# Patient Record
Sex: Female | Born: 1950
Health system: Southern US, Community
[De-identification: ages and names within clinical notes are randomized; demographics above are authoritative.]

## PROBLEM LIST (undated history)

## (undated) DIAGNOSIS — H3561 Retinal hemorrhage, right eye: Secondary | ICD-10-CM

## (undated) DIAGNOSIS — M8580 Other specified disorders of bone density and structure, unspecified site: Secondary | ICD-10-CM

## (undated) DIAGNOSIS — I10 Essential (primary) hypertension: Secondary | ICD-10-CM

## (undated) DIAGNOSIS — Z8619 Personal history of other infectious and parasitic diseases: Secondary | ICD-10-CM

## (undated) DIAGNOSIS — B019 Varicella without complication: Secondary | ICD-10-CM

## (undated) DIAGNOSIS — Z720 Tobacco use: Secondary | ICD-10-CM

## (undated) DIAGNOSIS — B059 Measles without complication: Secondary | ICD-10-CM

## (undated) DIAGNOSIS — E785 Hyperlipidemia, unspecified: Secondary | ICD-10-CM

## (undated) HISTORY — DX: Essential (primary) hypertension: I10

## (undated) HISTORY — DX: Tobacco use: Z72.0

## (undated) HISTORY — DX: Measles without complication: B05.9

## (undated) HISTORY — DX: Retinal hemorrhage, right eye: H35.61

## (undated) HISTORY — DX: Hyperlipidemia, unspecified: E78.5

## (undated) HISTORY — PX: REFRACTIVE SURGERY: SHX103

## (undated) HISTORY — DX: Varicella without complication: B01.9

## (undated) HISTORY — DX: Other specified disorders of bone density and structure, unspecified site: M85.80

## (undated) HISTORY — DX: Personal history of other infectious and parasitic diseases: Z86.19

---

## 2010-10-20 LAB — HM MAMMOGRAPHY: HM Mammogram: NORMAL

## 2010-10-20 LAB — HM PAP SMEAR: HM Pap smear: NORMAL

## 2014-03-16 ENCOUNTER — Encounter: Payer: Self-pay | Admitting: Family Medicine

## 2014-03-16 ENCOUNTER — Ambulatory Visit (INDEPENDENT_AMBULATORY_CARE_PROVIDER_SITE_OTHER): Payer: Managed Care, Other (non HMO) | Admitting: Family Medicine

## 2014-03-16 VITALS — BP 166/100 | HR 71 | Temp 98.2°F | Ht 64.25 in | Wt 146.1 lb

## 2014-03-16 DIAGNOSIS — IMO0001 Reserved for inherently not codable concepts without codable children: Secondary | ICD-10-CM

## 2014-03-16 DIAGNOSIS — R030 Elevated blood-pressure reading, without diagnosis of hypertension: Secondary | ICD-10-CM

## 2014-03-16 DIAGNOSIS — H3560 Retinal hemorrhage, unspecified eye: Secondary | ICD-10-CM

## 2014-03-16 DIAGNOSIS — I10 Essential (primary) hypertension: Secondary | ICD-10-CM

## 2014-03-16 DIAGNOSIS — B019 Varicella without complication: Secondary | ICD-10-CM | POA: Insufficient documentation

## 2014-03-16 DIAGNOSIS — F17200 Nicotine dependence, unspecified, uncomplicated: Secondary | ICD-10-CM

## 2014-03-16 DIAGNOSIS — E785 Hyperlipidemia, unspecified: Secondary | ICD-10-CM

## 2014-03-16 DIAGNOSIS — E782 Mixed hyperlipidemia: Secondary | ICD-10-CM | POA: Insufficient documentation

## 2014-03-16 DIAGNOSIS — Z8619 Personal history of other infectious and parasitic diseases: Secondary | ICD-10-CM | POA: Insufficient documentation

## 2014-03-16 DIAGNOSIS — Z0000 Encounter for general adult medical examination without abnormal findings: Secondary | ICD-10-CM

## 2014-03-16 DIAGNOSIS — Z720 Tobacco use: Secondary | ICD-10-CM

## 2014-03-16 DIAGNOSIS — H3561 Retinal hemorrhage, right eye: Secondary | ICD-10-CM

## 2014-03-16 HISTORY — DX: Retinal hemorrhage, right eye: H35.61

## 2014-03-16 HISTORY — DX: Hyperlipidemia, unspecified: E78.5

## 2014-03-16 HISTORY — DX: Tobacco use: Z72.0

## 2014-03-16 HISTORY — DX: Essential (primary) hypertension: I10

## 2014-03-16 LAB — CBC
HCT: 40.4 % (ref 36.0–46.0)
Hemoglobin: 13.6 g/dL (ref 12.0–15.0)
MCH: 26.2 pg (ref 26.0–34.0)
MCHC: 33.7 g/dL (ref 30.0–36.0)
MCV: 77.7 fL — AB (ref 78.0–100.0)
Platelets: 195 10*3/uL (ref 150–400)
RBC: 5.2 MIL/uL — AB (ref 3.87–5.11)
RDW: 14.4 % (ref 11.5–15.5)
WBC: 8.1 10*3/uL (ref 4.0–10.5)

## 2014-03-16 LAB — RENAL FUNCTION PANEL
Albumin: 4.5 g/dL (ref 3.5–5.2)
BUN: 11 mg/dL (ref 6–23)
CO2: 30 meq/L (ref 19–32)
CREATININE: 0.69 mg/dL (ref 0.50–1.10)
Calcium: 9.4 mg/dL (ref 8.4–10.5)
Chloride: 103 mEq/L (ref 96–112)
Glucose, Bld: 82 mg/dL (ref 70–99)
Phosphorus: 3.9 mg/dL (ref 2.3–4.6)
Potassium: 4.8 mEq/L (ref 3.5–5.3)
Sodium: 138 mEq/L (ref 135–145)

## 2014-03-16 LAB — LIPID PANEL
CHOL/HDL RATIO: 7.1 ratio
Cholesterol: 263 mg/dL — ABNORMAL HIGH (ref 0–200)
HDL: 37 mg/dL — ABNORMAL LOW (ref >39)
LDL Cholesterol: 187 mg/dL — ABNORMAL HIGH (ref 0–99)
Triglycerides: 197 mg/dL — ABNORMAL HIGH (ref <150)
VLDL: 39 mg/dL (ref 0–40)

## 2014-03-16 LAB — HEPATIC FUNCTION PANEL
ALBUMIN: 4.5 g/dL (ref 3.5–5.2)
ALK PHOS: 100 U/L (ref 39–117)
ALT: 10 U/L (ref 0–35)
AST: 16 U/L (ref 0–37)
Bilirubin, Direct: 0.1 mg/dL (ref 0.0–0.3)
Indirect Bilirubin: 0.4 mg/dL (ref 0.2–1.2)
Total Bilirubin: 0.5 mg/dL (ref 0.2–1.2)
Total Protein: 7.5 g/dL (ref 6.0–8.3)

## 2014-03-16 NOTE — Assessment & Plan Note (Signed)
Many years ago it was cauterized per patient and has not recurred, does have persistent defect in medial visual field on right eye

## 2014-03-16 NOTE — Assessment & Plan Note (Addendum)
Patient denies diagnosis of HTN in past but did say it was up at last visit a couple years ago. Poorly controlled  encouraged DASH diet, minimize caffeine and obtain adequate sleep. Report concerning symptoms and follow up as directed and as needed.

## 2014-03-16 NOTE — Assessment & Plan Note (Signed)
Encouraged complete cessation. Discussed need to quit as relates to risk of numerous cancers, cardiac and pulmonary disease as well as neurologic complications. Counseled for greater than 3 minutes 

## 2014-03-16 NOTE — Progress Notes (Signed)
Patient ID: Cynthia LintsCheryl Cassarino, female   DOB: 1951/09/07, 63 y.o.   MRN: [id number] Cynthia LintsCheryl Hubbard [id number] 1951/09/07 03/16/2014      Progress Note New Patient  Subjective  Chief Complaint  Chief Complaint  Patient presents with  . Establish Care    new patient    HPI  Patient is a 63 year old female in today for routine medical care. He is to establish care. Her OB/GYN return a couple of years ago and she needs a new primary doctor as well. She drinks a significant amount of caffeine each day with roughly 40 ounces of Pepsi and coffee everyday. Denies any recent illness or acute complaints. Denies CP/palp/SOB/HA/congestion/fevers/GI or GU c/o. Taking meds as prescribed  Past Medical History  Diagnosis Date  . Chicken pox 63 yrs old  . Measles 6 th grade    3 day measles  . Hyperlipidemia     weighed 30 pounds heavier- used to take crestor  . Hypertension     was 30 pounds heavier    History reviewed. No pertinent past surgical history.  Family History  Problem Relation Age of Onset  . Adopted: Yes    History   Social History  . Marital Status: Married    Spouse Name: N/A    Number of Children: N/A  . Years of Education: N/A   Occupational History  . Not on file.   Social History Main Topics  . Smoking status: Current Every Day Smoker -- 0.75 packs/day for 40 years    Types: Cigarettes  . Smokeless tobacco: Never Used  . Alcohol Use: Yes     Comment: very seldom  . Drug Use: No  . Sexual Activity: Not on file   Other Topics Concern  . Not on file   Social History Narrative  . No narrative on file    No current outpatient prescriptions on file prior to visit.   No current facility-administered medications on file prior to visit.    No Known Allergies  Review of Systems  Review of Systems  Constitutional: Negative for fever, chills and malaise/fatigue.  HENT: Negative for congestion, hearing loss and nosebleeds.   Eyes: Negative for discharge.   Respiratory: Negative for cough, sputum production, shortness of breath and wheezing.   Cardiovascular: Negative for chest pain, palpitations and leg swelling.  Gastrointestinal: Negative for heartburn, nausea, vomiting, abdominal pain, diarrhea, constipation and blood in stool.  Genitourinary: Negative for dysuria, urgency, frequency and hematuria.  Musculoskeletal: Negative for back pain, falls and myalgias.  Skin: Negative for rash.  Neurological: Negative for dizziness, tremors, sensory change, focal weakness, loss of consciousness, weakness and headaches.  Endo/Heme/Allergies: Negative for polydipsia. Does not bruise/bleed easily.  Psychiatric/Behavioral: Negative for depression and suicidal ideas. The patient is not nervous/anxious and does not have insomnia.     Objective  BP 166/100  Pulse 71  Temp(Src) 98.2 F (36.8 C) (Oral)  Ht 5' 4.25" (1.632 m)  Wt 146 lb 1.3 oz (66.261 kg)  BMI 24.88 kg/m2  SpO2 99%  Physical Exam  Physical Exam  Constitutional: She is oriented to person, place, and time and well-developed, well-nourished, and in no distress. No distress.  HENT:  Head: Normocephalic and atraumatic.  Right Ear: External ear normal.  Left Ear: External ear normal.  Nose: Nose normal.  Mouth/Throat: Oropharynx is clear and moist. No oropharyngeal exudate.  Eyes: Conjunctivae are normal. Pupils are equal, round, and reactive to light. Right eye exhibits no discharge. Left eye exhibits no  discharge. No scleral icterus.  Neck: Normal range of motion. Neck supple. No thyromegaly present.  Cardiovascular: Normal rate, regular rhythm, normal heart sounds and intact distal pulses.   No murmur heard. Pulmonary/Chest: Effort normal and breath sounds normal. No respiratory distress. She has no wheezes. She has no rales.  Abdominal: Soft. Bowel sounds are normal. She exhibits no distension and no mass. There is no tenderness.  Musculoskeletal: Normal range of motion. She  exhibits no edema and no tenderness.  Lymphadenopathy:    She has no cervical adenopathy.  Neurological: She is alert and oriented to person, place, and time. She has normal reflexes. No cranial nerve deficit. Coordination normal.  Skin: Skin is warm and dry. No rash noted. She is not diaphoretic.  Psychiatric: Mood, memory and affect normal.       Assessment & Plan   Elevated BP Patient denies diagnosis of HTN in past but did say it was up at last visit a couple years ago. Poorly controlled  encouraged DASH diet, minimize caffeine and obtain adequate sleep. Report concerning symptoms and follow up as directed and as needed.   Other and unspecified hyperlipidemia On Crestor in past abut has not taken in several years since her doctor retired. Will recheck lipids. Most likely restart Crestor  Retinal hemorrhage of right eye Many years ago it was cauterized per patient and has not recurred, does have persistent defect in medial visual field on right eye  Tobacco abuse disorder Encouraged complete cessation. Discussed need to quit as relates to risk of numerous cancers, cardiac and pulmonary disease as well as neurologic complications. Counseled for greater than 3 minutes  Preventative health care Declines referral for colonoscopy which she has never had despite significant discussion. Agrees to proceed with MGM. Will return for annual exam with gyn. Will have annual labs done

## 2014-03-16 NOTE — Patient Instructions (Addendum)
Call [facility] for your mammogram Probiotic such as Digestive Advantage, Phillip's colon health    Preventive Care for Adults, Female A healthy lifestyle and preventive care can promote health and wellness. Preventive health guidelines for women include the following key practices.  A routine yearly physical is a good way to check with your health care provider about your health and preventive screening. It is a chance to share any concerns and updates on your health and to receive a thorough exam.  Visit your dentist for a routine exam and preventive care every 6 months. Brush your teeth twice a day and floss once a day. Good oral hygiene prevents tooth decay and gum disease.  The frequency of eye exams is based on your age, health, family medical history, use of contact lenses, and other factors. Follow your health care provider's recommendations for frequency of eye exams.  Eat a healthy diet. Foods like vegetables, fruits, whole grains, low-fat dairy products, and lean protein foods contain the nutrients you need without too many calories. Decrease your intake of foods high in solid fats, added sugars, and salt. Eat the right amount of calories for you.Get information about a proper diet from your health care provider, if necessary.  Regular physical exercise is one of the most important things you can do for your health. Most adults should get at least 150 minutes of moderate-intensity exercise (any activity that increases your heart rate and causes you to sweat) each week. In addition, most adults need muscle-strengthening exercises on 2 or more days a week.  Maintain a healthy weight. The body mass index (BMI) is a screening tool to identify possible weight problems. It provides an estimate of body fat based on height and weight. Your health care provider can find your BMI, and can help you achieve or maintain a healthy weight.For adults 20 years and older:  A BMI below 18.5 is considered  underweight.  A BMI of 18.5 to 24.9 is normal.  A BMI of 25 to 29.9 is considered overweight.  A BMI of 30 and above is considered obese.  Maintain normal blood lipids and cholesterol levels by exercising and minimizing your intake of saturated fat. Eat a balanced diet with plenty of fruit and vegetables. Blood tests for lipids and cholesterol should begin at age 57 and be repeated every 5 years. If your lipid or cholesterol levels are high, you are over 50, or you are at high risk for heart disease, you may need your cholesterol levels checked more frequently.Ongoing high lipid and cholesterol levels should be treated with medicines if diet and exercise are not working.  If you smoke, find out from your health care provider how to quit. If you do not use tobacco, do not start.  Lung cancer screening is recommended for adults aged 13 80 years who are at high risk for developing lung cancer because of a history of smoking. A yearly low-dose CT scan of the lungs is recommended for people who have at least a 30-pack-year history of smoking and are a current smoker or have quit within the past 15 years. A pack year of smoking is smoking an average of 1 pack of cigarettes a day for 1 year (for example: 1 pack a day for 30 years or 2 packs a day for 15 years). Yearly screening should continue until the smoker has stopped smoking for at least 15 years. Yearly screening should be stopped for people who develop a health problem that would prevent them  from having lung cancer treatment.  If you are pregnant, do not drink alcohol. If you are breastfeeding, be very cautious about drinking alcohol. If you are not pregnant and choose to drink alcohol, do not have more than 1 drink per day. One drink is considered to be 12 ounces (355 mL) of beer, 5 ounces (148 mL) of wine, or 1.5 ounces (44 mL) of liquor.  Avoid use of street drugs. Do not share needles with anyone. Ask for help if you need support or  instructions about stopping the use of drugs.  High blood pressure causes heart disease and increases the risk of stroke. Your blood pressure should be checked at least every 1 to 2 years. Ongoing high blood pressure should be treated with medicines if weight loss and exercise do not work.  If you are 40 65 years old, ask your health care provider if you should take aspirin to prevent strokes.  Diabetes screening involves taking a blood sample to check your fasting blood sugar level. This should be done once every 3 years, after age 83, if you are within normal weight and without risk factors for diabetes. Testing should be considered at a younger age or be carried out more frequently if you are overweight and have at least 1 risk factor for diabetes.  Breast cancer screening is essential preventive care for women. You should practice "breast self-awareness." This means understanding the normal appearance and feel of your breasts and may include breast self-examination. Any changes detected, no matter how small, should be reported to a health care provider. Women in their 75s and 30s should have a clinical breast exam (CBE) by a health care provider as part of a regular health exam every 1 to 3 years. After age 6, women should have a CBE every year. Starting at age 68, women should consider having a mammogram (breast X-ray test) every year. Women who have a family history of breast cancer should talk to their health care provider about genetic screening. Women at a high risk of breast cancer should talk to their health care providers about having an MRI and a mammogram every year.  Breast cancer gene (BRCA)-related cancer risk assessment is recommended for women who have family members with BRCA-related cancers. BRCA-related cancers include breast, ovarian, tubal, and peritoneal cancers. Having family members with these cancers may be associated with an increased risk for harmful changes (mutations) in  the breast cancer genes BRCA1 and BRCA2. Results of the assessment will determine the need for genetic counseling and BRCA1 and BRCA2 testing.  The Pap test is a screening test for cervical cancer. A Pap test can show cell changes on the cervix that might become cervical cancer if left untreated. A Pap test is a procedure in which cells are obtained and examined from the lower end of the uterus (cervix).  Women should have a Pap test starting at age 73.  Between ages 68 and 69, Pap tests should be repeated every 2 years.  Beginning at age 45, you should have a Pap test every 3 years as long as the past 3 Pap tests have been normal.  Some women have medical problems that increase the chance of getting cervical cancer. Talk to your health care provider about these problems. It is especially important to talk to your health care provider if a new problem develops soon after your last Pap test. In these cases, your health care provider may recommend more frequent screening and Pap tests.  The above recommendations are the same for women who have or have not gotten the vaccine for human papillomavirus (HPV).  If you had a hysterectomy for a problem that was not cancer or a condition that could lead to cancer, then you no longer need Pap tests. Even if you no longer need a Pap test, a regular exam is a good idea to make sure no other problems are starting.  If you are between ages 79 and 26 years, and you have had normal Pap tests going back 10 years, you no longer need Pap tests. Even if you no longer need a Pap test, a regular exam is a good idea to make sure no other problems are starting.  If you have had past treatment for cervical cancer or a condition that could lead to cancer, you need Pap tests and screening for cancer for at least 20 years after your treatment.  If Pap tests have been discontinued, risk factors (such as a new sexual partner) need to be reassessed to determine if screening  should be resumed.  The HPV test is an additional test that may be used for cervical cancer screening. The HPV test looks for the virus that can cause the cell changes on the cervix. The cells collected during the Pap test can be tested for HPV. The HPV test could be used to screen women aged 73 years and older, and should be used in women of any age who have unclear Pap test results. After the age of 31, women should have HPV testing at the same frequency as a Pap test.  Colorectal cancer can be detected and often prevented. Most routine colorectal cancer screening begins at the age of 68 years and continues through age 19 years. However, your health care provider may recommend screening at an earlier age if you have risk factors for colon cancer. On a yearly basis, your health care provider may provide home test kits to check for hidden blood in the stool. Use of a small camera at the end of a tube, to directly examine the colon (sigmoidoscopy or colonoscopy), can detect the earliest forms of colorectal cancer. Talk to your health care provider about this at age 14, when routine screening begins. Direct exam of the colon should be repeated every 5 10 years through age 74 years, unless early forms of pre-cancerous polyps or small growths are found.  People who are at an increased risk for hepatitis B should be screened for this virus. You are considered at high risk for hepatitis B if:  You were born in a country where hepatitis B occurs often. Talk with your health care provider about which countries are considered high risk.  Your parents were born in a high-risk country and you have not received a shot to protect against hepatitis B (hepatitis B vaccine).  You have HIV or AIDS.  You use needles to inject street drugs.  You live with, or have sex with, someone who has Hepatitis B.  You get hemodialysis treatment.  You take certain medicines for conditions like cancer, organ transplantation,  and autoimmune conditions.  Hepatitis C blood testing is recommended for all people born from [birth year] through 1967 and any individual with known risks for hepatitis C.  Practice safe sex. Use condoms and avoid high-risk sexual practices to reduce the spread of sexually transmitted infections (STIs). STIs include gonorrhea, chlamydia, syphilis, trichomonas, herpes, HPV, and human immunodeficiency virus (HIV). Herpes, HIV, and HPV are viral illnesses that  have no cure. They can result in disability, cancer, and death. Sexually active women aged 8 years and younger should be checked for chlamydia. Older women with new or multiple partners should also be tested for chlamydia. Testing for other STIs is recommended if you are sexually active and at increased risk.  Osteoporosis is a disease in which the bones lose minerals and strength with aging. This can result in serious bone fractures or breaks. The risk of osteoporosis can be identified using a bone density scan. Women ages 77 years and over and women at risk for fractures or osteoporosis should discuss screening with their health care providers. Ask your health care provider whether you should take a calcium supplement or vitamin D to reduce the rate of osteoporosis.  Menopause can be associated with physical symptoms and risks. Hormone replacement therapy is available to decrease symptoms and risks. You should talk to your health care provider about whether hormone replacement therapy is right for you.  Use sunscreen. Apply sunscreen liberally and repeatedly throughout the day. You should seek shade when your shadow is shorter than you. Protect yourself by wearing long sleeves, pants, a wide-brimmed hat, and sunglasses year round, whenever you are outdoors.  Once a month, do a whole body skin exam, using a mirror to look at the skin on your back. Tell your health care provider of new moles, moles that have irregular borders, moles that are larger than a  pencil eraser, or moles that have changed in shape or color.  Stay current with required vaccines (immunizations).  Influenza vaccine. All adults should be immunized every year.  Tetanus, diphtheria, and acellular pertussis (Td, Tdap) vaccine. Pregnant women should receive 1 dose of Tdap vaccine during each pregnancy. The dose should be obtained regardless of the length of time since the last dose. Immunization is preferred during the 27th 36th week of gestation. An adult who has not previously received Tdap or who does not know her vaccine status should receive 1 dose of Tdap. This initial dose should be followed by tetanus and diphtheria toxoids (Td) booster doses every 10 years. Adults with an unknown or incomplete history of completing a 3-dose immunization series with Td-containing vaccines should begin or complete a primary immunization series including a Tdap dose. Adults should receive a Td booster every 10 years.  Varicella vaccine. An adult without evidence of immunity to varicella should receive 2 doses or a second dose if she has previously received 1 dose. Pregnant females who do not have evidence of immunity should receive the first dose after pregnancy. This first dose should be obtained before leaving the health care facility. The second dose should be obtained 4 8 weeks after the first dose.  Human papillomavirus (HPV) vaccine. Females aged 34 26 years who have not received the vaccine previously should obtain the 3-dose series. The vaccine is not recommended for use in pregnant females. However, pregnancy testing is not needed before receiving a dose. If a female is found to be pregnant after receiving a dose, no treatment is needed. In that case, the remaining doses should be delayed until after the pregnancy. Immunization is recommended for any person with an immunocompromised condition through the age of 46 years if she did not get any or all doses earlier. During the 3-dose series,  the second dose should be obtained 4 8 weeks after the first dose. The third dose should be obtained 24 weeks after the first dose and 16 weeks after the second dose.  Zoster vaccine. One dose is recommended for adults aged 44 years or older unless certain conditions are present.  Measles, mumps, and rubella (MMR) vaccine. Adults born before [birth year] generally are considered immune to measles and mumps. Adults born in [birth year] or later should have 1 or more doses of MMR vaccine unless there is a contraindication to the vaccine or there is laboratory evidence of immunity to each of the three diseases. A routine second dose of MMR vaccine should be obtained at least 28 days after the first dose for students attending postsecondary schools, health care workers, or international travelers. People who received inactivated measles vaccine or an unknown type of measles vaccine during 1963 1967 should receive 2 doses of MMR vaccine. People who received inactivated mumps vaccine or an unknown type of mumps vaccine before 1979 and are at high risk for mumps infection should consider immunization with 2 doses of MMR vaccine. For females of childbearing age, rubella immunity should be determined. If there is no evidence of immunity, females who are not pregnant should be vaccinated. If there is no evidence of immunity, females who are pregnant should delay immunization until after pregnancy. Unvaccinated health care workers born before [birth year] who lack laboratory evidence of measles, mumps, or rubella immunity or laboratory confirmation of disease should consider measles and mumps immunization with 2 doses of MMR vaccine or rubella immunization with 1 dose of MMR vaccine.  Pneumococcal 13-valent conjugate (PCV13) vaccine. When indicated, a person who is uncertain of her immunization history and has no record of immunization should receive the PCV13 vaccine. An adult aged 2 years or older who has certain medical conditions and has  not been previously immunized should receive 1 dose of PCV13 vaccine. This PCV13 should be followed with a dose of pneumococcal polysaccharide (PPSV23) vaccine. The PPSV23 vaccine dose should be obtained at least 8 weeks after the dose of PCV13 vaccine. An adult aged 6 years or older who has certain medical conditions and previously received 1 or more doses of PPSV23 vaccine should receive 1 dose of PCV13. The PCV13 vaccine dose should be obtained 1 or more years after the last PPSV23 vaccine dose.  Pneumococcal polysaccharide (PPSV23) vaccine. When PCV13 is also indicated, PCV13 should be obtained first. All adults aged 81 years and older should be immunized. An adult younger than age 24 years who has certain medical conditions should be immunized. Any person who resides in a nursing home or long-term care facility should be immunized. An adult smoker should be immunized. People with an immunocompromised condition and certain other conditions should receive both PCV13 and PPSV23 vaccines. People with human immunodeficiency virus (HIV) infection should be immunized as soon as possible after diagnosis. Immunization during chemotherapy or radiation therapy should be avoided. Routine use of PPSV23 vaccine is not recommended for American Indians, [place] Natives, or people younger than 65 years unless there are medical conditions that require PPSV23 vaccine. When indicated, people who have unknown immunization and have no record of immunization should receive PPSV23 vaccine. One-time revaccination 5 years after the first dose of PPSV23 is recommended for people aged 3 64 years who have chronic kidney failure, nephrotic syndrome, asplenia, or immunocompromised conditions. People who received 1 2 doses of PPSV23 before age 64 years should receive another dose of PPSV23 vaccine at age 64 years or later if at least 5 years have passed since the previous dose. Doses of PPSV23 are not needed for people immunized with  PPSV23 at or after age 45  years.  Meningococcal vaccine. Adults with asplenia or persistent complement component deficiencies should receive 2 doses of quadrivalent meningococcal conjugate (MenACWY-D) vaccine. The doses should be obtained at least 2 months apart. Microbiologists working with certain meningococcal bacteria, [employer] recruits, people at risk during an outbreak, and people who travel to or live in countries with a high rate of meningitis should be immunized. A first-year college student up through age 68 years who is living in a residence hall should receive a dose if she did not receive a dose on or after her 16th birthday. Adults who have certain high-risk conditions should receive one or more doses of vaccine.  Hepatitis A vaccine. Adults who wish to be protected from this disease, have certain high-risk conditions, work with hepatitis A-infected animals, work in hepatitis A research labs, or travel to or work in countries with a high rate of hepatitis A should be immunized. Adults who were previously unvaccinated and who anticipate close contact with an international adoptee during the first 60 days after arrival in the [place] from a country with a high rate of hepatitis A should be immunized.  Hepatitis B vaccine. Adults who wish to be protected from this disease, have certain high-risk conditions, may be exposed to blood or other infectious body fluids, are household contacts or sex partners of hepatitis B positive people, are clients or workers in certain care facilities, or travel to or work in countries with a high rate of hepatitis B should be immunized.  Haemophilus influenzae type b (Hib) vaccine. A previously unvaccinated person with asplenia or sickle cell disease or having a scheduled splenectomy should receive 1 dose of Hib vaccine. Regardless of previous immunization, a recipient of a hematopoietic stem cell transplant should receive a 3-dose series 6 12 months after her  successful transplant. Hib vaccine is not recommended for adults with HIV infection. Preventive Services / Frequency Ages 96 to 39years  Blood pressure check.** / Every 1 to 2 years.  Lipid and cholesterol check.** / Every 5 years beginning at age 91.  Clinical breast exam.** / Every 3 years for women in their 4s and 71s.  BRCA-related cancer risk assessment.** / For women who have family members with a BRCA-related cancer (breast, ovarian, tubal, or peritoneal cancers).  Pap test.** / Every 2 years from ages 38 through 20. Every 3 years starting at age 59 through age 73 or 74 with a history of 3 consecutive normal Pap tests.  HPV screening.** / Every 3 years from ages 56 through ages 59 to 83 with a history of 3 consecutive normal Pap tests.  Hepatitis C blood test.** / For any individual with known risks for hepatitis C.  Skin self-exam. / Monthly.  Influenza vaccine. / Every year.  Tetanus, diphtheria, and acellular pertussis (Tdap, Td) vaccine.** / Consult your health care provider. Pregnant women should receive 1 dose of Tdap vaccine during each pregnancy. 1 dose of Td every 10 years.  Varicella vaccine.** / Consult your health care provider. Pregnant females who do not have evidence of immunity should receive the first dose after pregnancy.  HPV vaccine. / 3 doses over 6 months, if 57 and younger. The vaccine is not recommended for use in pregnant females. However, pregnancy testing is not needed before receiving a dose.  Measles, mumps, rubella (MMR) vaccine.** / You need at least 1 dose of MMR if you were born in 1957 or later. You may also need a 2nd dose. For females of childbearing age, rubella  immunity should be determined. If there is no evidence of immunity, females who are not pregnant should be vaccinated. If there is no evidence of immunity, females who are pregnant should delay immunization until after pregnancy.  Pneumococcal 13-valent conjugate (PCV13) vaccine.** /  Consult your health care provider.  Pneumococcal polysaccharide (PPSV23) vaccine.** / 1 to 2 doses if you smoke cigarettes or if you have certain conditions.  Meningococcal vaccine.** / 1 dose if you are age 40 to 87 years and a Market researcher living in a residence hall, or have one of several medical conditions, you need to get vaccinated against meningococcal disease. You may also need additional booster doses.  Hepatitis A vaccine.** / Consult your health care provider.  Hepatitis B vaccine.** / Consult your health care provider.  Haemophilus influenzae type b (Hib) vaccine.** / Consult your health care provider. Ages 77 to 64years  Blood pressure check.** / Every 1 to 2 years.  Lipid and cholesterol check.** / Every 5 years beginning at age 14 years.  Lung cancer screening. / Every year if you are aged 26 80 years and have a 30-pack-year history of smoking and currently smoke or have quit within the past 15 years. Yearly screening is stopped once you have quit smoking for at least 15 years or develop a health problem that would prevent you from having lung cancer treatment.  Clinical breast exam.** / Every year after age 81 years.  BRCA-related cancer risk assessment.** / For women who have family members with a BRCA-related cancer (breast, ovarian, tubal, or peritoneal cancers).  Mammogram.** / Every year beginning at age 48 years and continuing for as long as you are in good health. Consult with your health care provider.  Pap test.** / Every 3 years starting at age 15 years through age 23 or 15 years with a history of 3 consecutive normal Pap tests.  HPV screening.** / Every 3 years from ages 52 years through ages 58 to 32 years with a history of 3 consecutive normal Pap tests.  Fecal occult blood test (FOBT) of stool. / Every year beginning at age 63 years and continuing until age 73 years. You may not need to do this test if you get a colonoscopy every 10  years.  Flexible sigmoidoscopy or colonoscopy.** / Every 5 years for a flexible sigmoidoscopy or every 10 years for a colonoscopy beginning at age 60 years and continuing until age 23 years.  Hepatitis C blood test.** / For all people born from [birth year] through 1967 and any individual with known risks for hepatitis C.  Skin self-exam. / Monthly.  Influenza vaccine. / Every year.  Tetanus, diphtheria, and acellular pertussis (Tdap/Td) vaccine.** / Consult your health care provider. Pregnant women should receive 1 dose of Tdap vaccine during each pregnancy. 1 dose of Td every 10 years.  Varicella vaccine.** / Consult your health care provider. Pregnant females who do not have evidence of immunity should receive the first dose after pregnancy.  Zoster vaccine.** / 1 dose for adults aged 47 years or older.  Measles, mumps, rubella (MMR) vaccine.** / You need at least 1 dose of MMR if you were born in 1957 or later. You may also need a 2nd dose. For females of childbearing age, rubella immunity should be determined. If there is no evidence of immunity, females who are not pregnant should be vaccinated. If there is no evidence of immunity, females who are pregnant should delay immunization until after pregnancy.  Pneumococcal 13-valent conjugate (  PCV13) vaccine.** / Consult your health care provider.  Pneumococcal polysaccharide (PPSV23) vaccine.** / 1 to 2 doses if you smoke cigarettes or if you have certain conditions.  Meningococcal vaccine.** / Consult your health care provider.  Hepatitis A vaccine.** / Consult your health care provider.  Hepatitis B vaccine.** / Consult your health care provider.  Haemophilus influenzae type b (Hib) vaccine.** / Consult your health care provider. Ages 47 years and over  Blood pressure check.** / Every 1 to 2 years.  Lipid and cholesterol check.** / Every 5 years beginning at age 30 years.  Lung cancer screening. / Every year if you are aged 81 80 years  and have a 30-pack-year history of smoking and currently smoke or have quit within the past 15 years. Yearly screening is stopped once you have quit smoking for at least 15 years or develop a health problem that would prevent you from having lung cancer treatment.  Clinical breast exam.** / Every year after age 23 years.  BRCA-related cancer risk assessment.** / For women who have family members with a BRCA-related cancer (breast, ovarian, tubal, or peritoneal cancers).  Mammogram.** / Every year beginning at age 74 years and continuing for as long as you are in good health. Consult with your health care provider.  Pap test.** / Every 3 years starting at age 47 years through age 36 or 73 years with 3 consecutive normal Pap tests. Testing can be stopped between 65 and 70 years with 3 consecutive normal Pap tests and no abnormal Pap or HPV tests in the past 10 years.  HPV screening.** / Every 3 years from ages 35 years through ages 44 or 61 years with a history of 3 consecutive normal Pap tests. Testing can be stopped between 65 and 70 years with 3 consecutive normal Pap tests and no abnormal Pap or HPV tests in the past 10 years.  Fecal occult blood test (FOBT) of stool. / Every year beginning at age 87 years and continuing until age 78 years. You may not need to do this test if you get a colonoscopy every 10 years.  Flexible sigmoidoscopy or colonoscopy.** / Every 5 years for a flexible sigmoidoscopy or every 10 years for a colonoscopy beginning at age 15 years and continuing until age 36 years.  Hepatitis C blood test.** / For all people born from [birth year] through 1967 and any individual with known risks for hepatitis C.  Osteoporosis screening.** / A one-time screening for women ages 70 years and over and women at risk for fractures or osteoporosis.  Skin self-exam. / Monthly.  Influenza vaccine. / Every year.  Tetanus, diphtheria, and acellular pertussis (Tdap/Td) vaccine.** / 1 dose of Td  every 10 years.  Varicella vaccine.** / Consult your health care provider.  Zoster vaccine.** / 1 dose for adults aged 55 years or older.  Pneumococcal 13-valent conjugate (PCV13) vaccine.** / Consult your health care provider.  Pneumococcal polysaccharide (PPSV23) vaccine.** / 1 dose for all adults aged 42 years and older.  Meningococcal vaccine.** / Consult your health care provider.  Hepatitis A vaccine.** / Consult your health care provider.  Hepatitis B vaccine.** / Consult your health care provider.  Haemophilus influenzae type b (Hib) vaccine.** / Consult your health care provider. ** Family history and personal history of risk and conditions may change your health care provider's recommendations. Document Released: 12/02/2001 Document Revised: 07/27/2013 Document Reviewed: 03/03/2011 [facility] Patient Information 2014 [place], [place].

## 2014-03-16 NOTE — Progress Notes (Signed)
Pre visit review using our clinic review tool, if applicable. No additional management support is needed unless otherwise documented below in the visit note. 

## 2014-03-16 NOTE — Assessment & Plan Note (Signed)
On Crestor in past abut has not taken in several years since her doctor retired. Will recheck lipids. Most likely restart Crestor

## 2014-03-17 ENCOUNTER — Telehealth: Payer: Self-pay

## 2014-03-17 ENCOUNTER — Telehealth: Payer: Self-pay | Admitting: Family Medicine

## 2014-03-17 DIAGNOSIS — E785 Hyperlipidemia, unspecified: Secondary | ICD-10-CM

## 2014-03-17 LAB — TSH: TSH: 1.262 u[IU]/mL (ref 0.350–4.500)

## 2014-03-17 MED ORDER — SIMVASTATIN 10 MG PO TABS: 10.0000 mg | ORAL_TABLET | Freq: Every day | ORAL | Status: DC

## 2014-03-17 NOTE — Telephone Encounter (Signed)
Order placed per md

## 2014-03-17 NOTE — Telephone Encounter (Signed)
Relevant patient education mailed to patient.  

## 2014-03-19 DIAGNOSIS — Z0000 Encounter for general adult medical examination without abnormal findings: Secondary | ICD-10-CM | POA: Insufficient documentation

## 2014-03-19 NOTE — Assessment & Plan Note (Signed)
Declines referral for colonoscopy which she has never had despite significant discussion. Agrees to proceed with MGM. Will return for annual exam with gyn. Will have annual labs done

## 2014-04-04 ENCOUNTER — Telehealth: Payer: Self-pay | Admitting: Family Medicine

## 2014-04-04 NOTE — Telephone Encounter (Signed)
[facility]

## 2014-04-17 ENCOUNTER — Telehealth: Payer: Self-pay | Admitting: Family Medicine

## 2014-04-17 ENCOUNTER — Ambulatory Visit (INDEPENDENT_AMBULATORY_CARE_PROVIDER_SITE_OTHER): Payer: Managed Care, Other (non HMO) | Admitting: Family Medicine

## 2014-04-17 ENCOUNTER — Encounter: Payer: Self-pay | Admitting: Family Medicine

## 2014-04-17 VITALS — BP 150/98 | HR 76 | Temp 98.2°F

## 2014-04-17 VITALS — BP 146/96 | HR 76 | Temp 98.2°F | Ht 64.25 in

## 2014-04-17 DIAGNOSIS — F17200 Nicotine dependence, unspecified, uncomplicated: Secondary | ICD-10-CM

## 2014-04-17 DIAGNOSIS — I10 Essential (primary) hypertension: Secondary | ICD-10-CM

## 2014-04-17 DIAGNOSIS — Z720 Tobacco use: Secondary | ICD-10-CM

## 2014-04-17 DIAGNOSIS — E785 Hyperlipidemia, unspecified: Secondary | ICD-10-CM

## 2014-04-17 MED ORDER — LISINOPRIL 5 MG PO TABS: 5.0000 mg | ORAL_TABLET | Freq: Every day | ORAL | Status: DC

## 2014-04-17 NOTE — Patient Instructions (Signed)
CoQ10 daily   DASH Eating Plan DASH stands for "Dietary Approaches to Stop Hypertension." The DASH eating plan is a healthy eating plan that has been shown to reduce high blood pressure (hypertension). Additional health benefits may include reducing the risk of type 2 diabetes mellitus, heart disease, and stroke. The DASH eating plan may also help with weight loss. WHAT DO I NEED TO KNOW ABOUT THE DASH EATING PLAN? For the DASH eating plan, you will follow these general guidelines:  Choose foods with a percent daily value for sodium of less than 5% (as listed on the food label).  Use salt-free seasonings or herbs instead of table salt or sea salt.  Check with your health care provider or pharmacist before using salt substitutes.  Eat lower-sodium products, often labeled as "lower sodium" or "no salt added."  Eat fresh foods.  Eat more vegetables, fruits, and low-fat dairy products.  Choose whole grains. Look for the word "whole" as the first word in the ingredient list.  Choose fish and skinless chicken or [place]key more often than red meat. Limit fish, poultry, and meat to 6 oz (170 g) each day.  Limit sweets, desserts, sugars, and sugary drinks.  Choose heart-healthy fats.  Limit cheese to 1 oz (28 g) per day.  Eat more home-cooked food and less restaurant, buffet, and fast food.  Limit fried foods.  Cook foods using methods other than frying.  Limit canned vegetables. If you do use them, rinse them well to decrease the sodium.  When eating at a restaurant, ask that your food be prepared with less salt, or no salt if possible. WHAT FOODS CAN I EAT? Seek help from a dietitian for individual calorie needs. Grains Whole grain or whole wheat bread. Brown rice. Whole grain or whole wheat pasta. Quinoa, bulgur, and whole grain cereals. Low-sodium cereals. Corn or whole wheat flour tortillas. Whole grain cornbread. Whole grain crackers. Low-sodium crackers. Vegetables Fresh or  frozen vegetables (raw, steamed, roasted, or grilled). Low-sodium or reduced-sodium tomato and vegetable juices. Low-sodium or reduced-sodium tomato sauce and paste. Low-sodium or reduced-sodium canned vegetables.  Fruits All fresh, canned (in natural juice), or frozen fruits. Meat and Other Protein Products Ground beef (85% or leaner), grass-fed beef, or beef trimmed of fat. Skinless chicken or [place]key. Ground chicken or [place]urkey. Pork trimmed of fat. All fish and seafood. Eggs. Dried beans, peas, or lentils. Unsalted nuts and seeds. Unsalted canned beans. Dairy Low-fat dairy products, such as skim or 1% milk, 2% or reduced-fat cheeses, low-fat ricotta or cottage cheese, or plain low-fat yogurt. Low-sodium or reduced-sodium cheeses. Fats and Oils Tub margarines without trans fats. Light or reduced-fat mayonnaise and salad dressings (reduced sodium). Avocado. Safflower, olive, or canola oils. Natural peanut or almond butter. Other Unsalted popcorn and pretzels. The items listed above may not be a complete list of recommended foods or beverages. Contact your dietitian for more options. WHAT FOODS ARE NOT RECOMMENDED? Grains White bread. White pasta. White rice. Refined cornbread. Bagels and croissants. Crackers that contain trans fat. Vegetables Creamed or fried vegetables. Vegetables in a cheese sauce. Regular canned vegetables. Regular canned tomato sauce and paste. Regular tomato and vegetable juices. Fruits Dried fruits. Canned fruit in light or heavy syrup. Fruit juice. Meat and Other Protein Products Fatty cuts of meat. Ribs, chicken wings, bacon, sausage, bologna, salami, chitterlings, fatback, hot dogs, bratwurst, and packaged luncheon meats. Salted nuts and seeds. Canned beans with salt. Dairy Whole or 2% milk, cream, half-and-half, and cream cheese. Whole-fat  or sweetened yogurt. Full-fat cheeses or blue cheese. Nondairy creamers and whipped toppings. Processed cheese, cheese spreads, or  cheese curds. Condiments Onion and garlic salt, seasoned salt, table salt, and sea salt. Canned and packaged gravies. Worcestershire sauce. Tartar sauce. Barbecue sauce. Teriyaki sauce. Soy sauce, including reduced sodium. Steak sauce. Fish sauce. Oyster sauce. Cocktail sauce. Horseradish. Ketchup and mustard. Meat flavorings and tenderizers. Bouillon cubes. Hot sauce. Tabasco sauce. Marinades. Taco seasonings. Relishes. Fats and Oils Butter, stick margarine, lard, shortening, ghee, and bacon fat. Coconut, palm kernel, or palm oils. Regular salad dressings. Other Pickles and olives. Salted popcorn and pretzels. The items listed above may not be a complete list of foods and beverages to avoid. Contact your dietitian for more information. WHERE CAN I FIND MORE INFORMATION? National Heart, Lung, and Blood Institute: CableProm[employer].itwww.nhlbi.nih.gov/health/health-topics/topics/dash/ Document Released: 09/25/2011 Document Revised: 10/11/2013 Document Reviewed: 08/10/2013 [facility] Patient Information 2015 [place]. This information is not intended to replace advice given to you by your health care provider. Make sure you discuss any questions you have with your health care provider.

## 2014-04-17 NOTE — Assessment & Plan Note (Signed)
Encouraged complete cessation. Discussed need to quit as relates to risk of numerous cancers, cardiac and pulmonary disease as well as neurologic complications. Counseled for greater than 3 minutes 

## 2014-04-17 NOTE — Assessment & Plan Note (Signed)
Tolerating Simvastatin, start Co Q 10

## 2014-04-17 NOTE — Progress Notes (Signed)
Patient ID: Cynthia Hubbard Nienaber, female   DOB: 01-04-1951, 63 y.o.   MRN: [id number] Cynthia Hubbard Macari [id number] 01-04-1951 04/17/2014      Progress Note-Follow Up  Subjective  Chief Complaint  Chief Complaint  Patient presents with  . Follow-up    bp    HPI  Patient is a 63 year old female in today for routine medical care. The patient was in today for blood pressure check but unfortunately her blood pressure remained elevated despite repeated checks. She reports feeling well. She denies any recent illness. Denies CP/palp/SOB/HA/congestion/fevers/GI or GU c/o. Taking meds as prescribed   Past Medical History  Diagnosis Date  . Chicken pox 63 yrs old  . Measles 6 th grade    3 day measles  . Hyperlipidemia     weighed 30 pounds heavier- used to take crestor  . Hypertension     was 30 pounds heavier  . Other and unspecified hyperlipidemia 03/16/2014  . Retinal hemorrhage of right eye 03/16/2014  . Tobacco abuse disorder 03/16/2014  . HTN (hypertension) 03/16/2014    Past Surgical History  Procedure Laterality Date  . Refractive surgery Right     Family History  Problem Relation Age of Onset  . Adopted: Yes    History   Social History  . Marital Status: Married    Spouse Name: N/A    Number of Children: N/A  . Years of Education: N/A   Occupational History  . Not on file.   Social History Main Topics  . Smoking status: Current Every Day Smoker -- 0.75 packs/day for 40 years    Types: Cigarettes  . Smokeless tobacco: Never Used  . Alcohol Use: Yes     Comment: very seldom  . Drug Use: No  . Sexual Activity: Yes     Comment: lives with husband, no dietary restrictions, works part time at [employer]   Other Topics Concern  . Not on file   Social History Narrative  . No narrative on file    Current Outpatient Prescriptions on File Prior to Visit  Medication Sig Dispense Refill  . Cholecalciferol (VITAMIN D3) 5000 UNITS CAPS Take 1 capsule by mouth daily.       Marland Kitchen. OVER THE COUNTER MEDICATION Mega red daily      . simvastatin (ZOCOR) 10 MG tablet Take 1 tablet (10 mg total) by mouth daily.  30 tablet  3   No current facility-administered medications on file prior to visit.    No Known Allergies  Review of Systems  Review of Systems  Constitutional: Negative for fever and malaise/fatigue.  HENT: Negative for congestion.   Eyes: Negative for discharge.  Respiratory: Negative for shortness of breath.   Cardiovascular: Negative for chest pain, palpitations and leg swelling.  Gastrointestinal: Negative for nausea, abdominal pain and diarrhea.  Genitourinary: Negative for dysuria.  Musculoskeletal: Negative for falls.  Skin: Negative for rash.  Neurological: Negative for loss of consciousness and headaches.  Endo/Heme/Allergies: Negative for polydipsia.  Psychiatric/Behavioral: Negative for depression and suicidal ideas. The patient is not nervous/anxious and does not have insomnia.     Objective  BP 148/102  Pulse 76  Temp(Src) 98.2 F (36.8 C) (Oral)  Ht 5' 4.25" (1.632 m)  SpO2 94%  Physical Exam  Physical Exam  Constitutional: She is oriented to person, place, and time and well-developed, well-nourished, and in no distress. No distress.  HENT:  Head: Normocephalic and atraumatic.  Eyes: Conjunctivae are normal.  Neck: Neck supple. No  thyromegaly present.  Cardiovascular: Normal rate, regular rhythm and normal heart sounds.   No murmur heard. Pulmonary/Chest: Effort normal and breath sounds normal. She has no wheezes.  Abdominal: She exhibits no distension and no mass.  Musculoskeletal: She exhibits no edema.  Lymphadenopathy:    She has no cervical adenopathy.  Neurological: She is alert and oriented to person, place, and time.  Skin: Skin is warm and dry. No rash noted. She is not diaphoretic.  Psychiatric: Memory, affect and judgment normal.    Lab Results  Component Value Date   TSH 1.262 03/16/2014   Lab Results   Component Value Date   WBC 8.1 03/16/2014   HGB 13.6 03/16/2014   HCT 40.4 03/16/2014   MCV 77.7* 03/16/2014   PLT 195 03/16/2014   Lab Results  Component Value Date   CREATININE 0.69 03/16/2014   BUN 11 03/16/2014   NA 138 03/16/2014   K 4.8 03/16/2014   CL 103 03/16/2014   CO2 30 03/16/2014   Lab Results  Component Value Date   ALT 10 03/16/2014   AST 16 03/16/2014   ALKPHOS 100 03/16/2014   BILITOT 0.5 03/16/2014   Lab Results  Component Value Date   CHOL 263* 03/16/2014   Lab Results  Component Value Date   HDL 37* 03/16/2014   Lab Results  Component Value Date   LDLCALC 187* 03/16/2014   Lab Results  Component Value Date   TRIG 197* 03/16/2014   Lab Results  Component Value Date   CHOLHDL 7.1 03/16/2014     Assessment & Plan  Other and unspecified hyperlipidemia Tolerating Simvastatin, start Co Q 10  HTN (hypertension) Improved on recheck but will start Lisinopril 5 mg and encouraged DASH diet  Tobacco abuse disorder Encouraged complete cessation. Discussed need to quit as relates to risk of numerous cancers, cardiac and pulmonary disease as well as neurologic complications. Counseled for greater than 3 minutes

## 2014-04-17 NOTE — Progress Notes (Signed)
Pre visit review using our clinic review tool, if applicable. No additional management support is needed unless otherwise documented below in the visit note. 

## 2014-04-17 NOTE — Telephone Encounter (Signed)
Relevant patient education assigned to patient using Emmi. ° °

## 2014-04-17 NOTE — Progress Notes (Signed)
   Subjective:    Patient ID: Cynthia LintsCheryl Stanfill, female    DOB: 05/16/1951, 63 y.o.   MRN: [id number]  HPI    Review of Systems     Objective:   Physical Exam        Assessment & Plan:  Pt came in today to get her BP checked.

## 2014-04-17 NOTE — Assessment & Plan Note (Signed)
Improved on recheck but will start Lisinopril 5 mg and encouraged DASH diet

## 2014-04-18 ENCOUNTER — Telehealth: Payer: Self-pay | Admitting: Family Medicine

## 2014-04-18 NOTE — Telephone Encounter (Signed)
Relevant patient education assigned to patient using Emmi. ° °

## 2014-04-23 NOTE — Progress Notes (Signed)
Patient ID: Cynthia Hubbard, female   DOB: 1951/04/03, 63 y.o.   MRN: [id number] duplicate

## 2014-05-25 ENCOUNTER — Encounter: Payer: Self-pay | Admitting: Family Medicine

## 2014-06-07 ENCOUNTER — Telehealth: Payer: Self-pay | Admitting: Family Medicine

## 2014-06-07 MED ORDER — SIMVASTATIN 10 MG PO TABS: 10.0000 mg | ORAL_TABLET | Freq: Every day | ORAL | Status: DC

## 2014-06-07 NOTE — Telephone Encounter (Signed)
Rx sent to pharmacy 30 day supply x 3 rfs.

## 2014-06-07 NOTE — Telephone Encounter (Signed)
Refill- simvastatin ° °[facility] [address] °

## 2014-06-19 ENCOUNTER — Telehealth: Payer: Self-pay | Admitting: Family Medicine

## 2014-06-19 ENCOUNTER — Ambulatory Visit: Payer: Self-pay | Admitting: Family Medicine

## 2014-06-19 ENCOUNTER — Other Ambulatory Visit (INDEPENDENT_AMBULATORY_CARE_PROVIDER_SITE_OTHER): Payer: Medicare HMO

## 2014-06-19 DIAGNOSIS — I10 Essential (primary) hypertension: Secondary | ICD-10-CM

## 2014-06-19 DIAGNOSIS — E785 Hyperlipidemia, unspecified: Secondary | ICD-10-CM

## 2014-06-19 MED ORDER — LISINOPRIL 5 MG PO TABS: 5.0000 mg | ORAL_TABLET | Freq: Every day | ORAL | Status: DC

## 2014-06-19 NOTE — Telephone Encounter (Signed)
SHE IS OUT OF HER BP MED SHE WILL RUN OUT OF HER CHOL MED AS OF TONIGHT. SHE IS HERE FOR LABS. SHOULD SHE GET A REFILL FOR HER MED OR WAIT FOR HER LAB RESULTS TOMORROW. [facility]

## 2014-06-20 LAB — LIPID PANEL
CHOL/HDL RATIO: 5
CHOLESTEROL: 133 mg/dL (ref 0–200)
HDL: 27.9 mg/dL — ABNORMAL LOW (ref >39.00)
LDL CALC: 78 mg/dL (ref 0–99)
NONHDL: 105.1
Triglycerides: 136 mg/dL (ref 0.0–149.0)
VLDL: 27.2 mg/dL (ref 0.0–40.0)

## 2014-06-22 ENCOUNTER — Ambulatory Visit (INDEPENDENT_AMBULATORY_CARE_PROVIDER_SITE_OTHER): Payer: Medicare HMO | Admitting: Family Medicine

## 2014-06-22 ENCOUNTER — Encounter: Payer: Self-pay | Admitting: Family Medicine

## 2014-06-22 VITALS — BP 110/70 | HR 80 | Temp 98.3°F | Ht 64.25 in | Wt 138.2 lb

## 2014-06-22 DIAGNOSIS — Z0000 Encounter for general adult medical examination without abnormal findings: Secondary | ICD-10-CM

## 2014-06-22 DIAGNOSIS — F17200 Nicotine dependence, unspecified, uncomplicated: Secondary | ICD-10-CM

## 2014-06-22 DIAGNOSIS — I10 Essential (primary) hypertension: Secondary | ICD-10-CM

## 2014-06-22 DIAGNOSIS — Z720 Tobacco use: Secondary | ICD-10-CM

## 2014-06-22 DIAGNOSIS — E785 Hyperlipidemia, unspecified: Secondary | ICD-10-CM

## 2014-06-22 NOTE — Progress Notes (Signed)
Pre visit review using our clinic review tool, if applicable. No additional management support is needed unless otherwise documented below in the visit note. 

## 2014-06-22 NOTE — Patient Instructions (Signed)
Colonoscopy  A colonoscopy is an exam to look at the entire large intestine (colon). This exam can help find problems such as tumors, polyps, inflammation, and areas of bleeding. The exam takes about 1 hour.   LET YOUR HEALTH CARE PROVIDER KNOW ABOUT:   · Any allergies you have.  · All medicines you are taking, including vitamins, herbs, eye drops, creams, and over-the-counter medicines.  · Previous problems you or members of your family have had with the use of anesthetics.  · Any blood disorders you have.  · Previous surgeries you have had.  · Medical conditions you have.  RISKS AND COMPLICATIONS   Generally, this is a safe procedure. However, as with any procedure, complications can occur. Possible complications include:  · Bleeding.  · Tearing or rupture of the colon wall.  · Reaction to medicines given during the exam.  · Infection (rare).  BEFORE THE PROCEDURE   · Ask your health care provider about changing or stopping your regular medicines.  · You may be prescribed an oral bowel prep. This involves drinking a large amount of medicated liquid, starting the day before your procedure. The liquid will cause you to have multiple loose stools until your stool is almost clear or light green. This cleans out your colon in preparation for the procedure.  · Do not eat or drink anything else once you have started the bowel prep, unless your health care provider tells you it is safe to do so.  · Arrange for someone to drive you home after the procedure.  PROCEDURE   · You will be given medicine to help you relax (sedative).  · You will lie on your side with your knees bent.  · A long, flexible tube with a light and camera on the end (colonoscope) will be inserted through the rectum and into the colon. The camera sends video back to a computer screen as it moves through the colon. The colonoscope also releases carbon dioxide gas to inflate the colon. This helps your health care provider see the area better.  · During  the exam, your health care provider may take a small tissue sample (biopsy) to be examined under a microscope if any abnormalities are found.  · The exam is finished when the entire colon has been viewed.  AFTER THE PROCEDURE   · Do not drive for 24 hours after the exam.  · You may have a small amount of blood in your stool.  · You may pass moderate amounts of gas and have mild abdominal cramping or bloating. This is caused by the gas used to inflate your colon during the exam.  · Ask when your test results will be ready and how you will get your results. Make sure you get your test results.  Document Released: 10/03/2000 Document Revised: 07/27/2013 Document Reviewed: 06/13/2013  ExitCare® Patient Information ©2015 ExitCare, LLC. This information is not intended to replace advice given to you by your health care provider. Make sure you discuss any questions you have with your health care provider.

## 2014-06-22 NOTE — Progress Notes (Signed)
Patient ID: Cynthia Hubbard, female   DOB: 08-Mar-1951, 63 y.o.   MRN: [id number] Cynthia Hubbard [id number] [birth year] 06/22/2014      Progress Note-Follow Up  Subjective  Chief Complaint  Chief Complaint  Patient presents with  . Annual Exam    medicare wellness    HPI  Patient is a 63 year old female in today for routine medical care. Patient is in today for annual exam. Notes since starting lisinopril she's been feeling fatigued and having episodes of feeling excessively sweaty. Notes her GI symptoms have improved with Vear Clock: Health. She had nausea vomiting and diarrhea but that is resolved. She has cut down to 1 Pepsi daily. Feels better. Denies CP/palp/SOB/HA/congestion/fevers/GI or GU c/o. Taking meds as prescribed. Under a great deal of stress with her husband undergoing treatment for Prostate Canncer but feels she is handling it well  Past Medical History  Diagnosis Date  . Chicken pox 63 yrs old  . Measles 6 th grade    3 day measles  . Hyperlipidemia     weighed 30 pounds heavier- used to take crestor  . Hypertension     was 30 pounds heavier  . Other and unspecified hyperlipidemia 03/16/2014  . Retinal hemorrhage of right eye 03/16/2014  . Tobacco abuse disorder 03/16/2014  . HTN (hypertension) 03/16/2014    Past Surgical History  Procedure Laterality Date  . Refractive surgery Right     Family History  Problem Relation Age of Onset  . Adopted: Yes    History   Social History  . Marital Status: Married    Spouse Name: N/A    Number of Children: N/A  . Years of Education: N/A   Occupational History  . Not on file.   Social History Main Topics  . Smoking status: Current Every Day Smoker -- 0.75 packs/day for 40 years    Types: Cigarettes  . Smokeless tobacco: Never Used  . Alcohol Use: Yes     Comment: very seldom  . Drug Use: No  . Sexual Activity: Yes     Comment: lives with husband, no dietary restrictions, works part time at [employer]    Other Topics Concern  . Not on file   Social History Narrative  . No narrative on file    Current Outpatient Prescriptions on File Prior to Visit  Medication Sig Dispense Refill  . Cholecalciferol (VITAMIN D3) 5000 UNITS CAPS Take 1 capsule by mouth daily.      Marland Kitchen lisinopril (PRINIVIL,ZESTRIL) 5 MG tablet Take 1 tablet (5 mg total) by mouth daily.  30 tablet  3  . OVER THE COUNTER MEDICATION Mega red daily      . simvastatin (ZOCOR) 10 MG tablet Take 1 tablet (10 mg total) by mouth daily.  30 tablet  3   No current facility-administered medications on file prior to visit.    No Known Allergies  Review of Systems  Review of Systems  Constitutional: Negative for fever and malaise/fatigue.  HENT: Negative for congestion.   Eyes: Negative for discharge.  Respiratory: Negative for shortness of breath.   Cardiovascular: Negative for chest pain, palpitations and leg swelling.  Gastrointestinal: Negative for nausea, abdominal pain and diarrhea.  Genitourinary: Negative for dysuria.  Musculoskeletal: Negative for falls.  Skin: Negative for rash.  Neurological: Negative for loss of consciousness and headaches.  Endo/Heme/Allergies: Negative for polydipsia.  Psychiatric/Behavioral: Negative for depression and suicidal ideas. The patient is not nervous/anxious and does not have insomnia.  Objective  BP 110/70  Pulse 80  Temp(Src) 98.3 F (36.8 C) (Oral)  Ht 5' 4.25" (1.632 m)  Wt 138 lb 3.2 oz (62.687 kg)  BMI 23.54 kg/m2  SpO2 95%  Physical Exam  Physical Exam  Constitutional: She is oriented to person, place, and time and well-developed, well-nourished, and in no distress. No distress.  HENT:  Head: Normocephalic and atraumatic.  Right Ear: External ear normal.  Left Ear: External ear normal.  Nose: Nose normal.  Mouth/Throat: Oropharynx is clear and moist. No oropharyngeal exudate.  Eyes: Conjunctivae are normal. Pupils are equal, round, and reactive to light.  Right eye exhibits no discharge. Left eye exhibits no discharge. No scleral icterus.  Neck: Normal range of motion. Neck supple. No thyromegaly present.  Cardiovascular: Normal rate, regular rhythm, normal heart sounds and intact distal pulses.   No murmur heard. Pulmonary/Chest: Effort normal and breath sounds normal. No respiratory distress. She has no wheezes. She has no rales.  Abdominal: Soft. Bowel sounds are normal. She exhibits no distension and no mass. There is no tenderness.  Musculoskeletal: Normal range of motion. She exhibits no edema and no tenderness.  Lymphadenopathy:    She has no cervical adenopathy.  Neurological: She is alert and oriented to person, place, and time. She has normal reflexes. No cranial nerve deficit. Coordination normal.  Skin: Skin is warm and dry. No rash noted. She is not diaphoretic.  Psychiatric: Mood, memory and affect normal.    Lab Results  Component Value Date   TSH 1.262 03/16/2014   Lab Results  Component Value Date   WBC 8.1 03/16/2014   HGB 13.6 03/16/2014   HCT 40.4 03/16/2014   MCV 77.7* 03/16/2014   PLT 195 03/16/2014   Lab Results  Component Value Date   CREATININE 0.69 03/16/2014   BUN 11 03/16/2014   NA 138 03/16/2014   K 4.8 03/16/2014   CL 103 03/16/2014   CO2 30 03/16/2014   Lab Results  Component Value Date   ALT 10 03/16/2014   AST 16 03/16/2014   ALKPHOS 100 03/16/2014   BILITOT 0.5 03/16/2014   Lab Results  Component Value Date   CHOL 133 06/19/2014   Lab Results  Component Value Date   HDL 27.90* 06/19/2014   Lab Results  Component Value Date   LDLCALC 78 06/19/2014   Lab Results  Component Value Date   TRIG 136.0 06/19/2014   Lab Results  Component Value Date   CHOLHDL 5 06/19/2014     Assessment & Plan   HTN (hypertension) Well controlled. Encouraged heart healthy diet such as the DASH diet and exercise as tolerated. Stop Lisinopril due ot improved weight and diet  Tobacco abuse disorder Encouraged  complete cessation. Discussed need to quit as relates to risk of numerous cancers, cardiac and pulmonary disease as well as neurologic complications. Counseled for greater than 3 minutes  Other and unspecified hyperlipidemia Tolerating statin, encouraged heart healthy diet, avoid trans fats, minimize simple carbs and saturated fats. Increase exercise as tolerated  Medicare annual wellness visit, initial Patient denies any difficulties at home. No trouble with ADLs, depression or falls. No recent changes to vision or hearing. Is UTD with immunizations. Is UTD with screening. Discussed Advanced Directives, patient agrees to bring [place] copies of documents if can. Encouraged heart healthy diet, exercise as tolerated and adequate sleep. EKG today unremarkable. Declines flu shot today

## 2014-06-22 NOTE — Assessment & Plan Note (Addendum)
Well controlled. Encouraged heart healthy diet such as the DASH diet and exercise as tolerated. Stop Lisinopril due ot improved weight and diet

## 2014-06-26 DIAGNOSIS — Z0000 Encounter for general adult medical examination without abnormal findings: Secondary | ICD-10-CM | POA: Insufficient documentation

## 2014-06-26 NOTE — Assessment & Plan Note (Signed)
Tolerating statin, encouraged heart healthy diet, avoid trans fats, minimize simple carbs and saturated fats. Increase exercise as tolerated 

## 2014-06-26 NOTE — Assessment & Plan Note (Addendum)
Patient denies any difficulties at home. No trouble with ADLs, depression or falls. No recent changes to vision or hearing. Is UTD with immunizations. Is UTD with screening. Discussed Advanced Directives, patient agrees to bring [place] copies of documents if can. Encouraged heart healthy diet, exercise as tolerated and adequate sleep. EKG today unremarkable. Declines flu shot today. UTD on MGM done in July 2015 at [facility], UTD with GYN declines referral for colonoscopy

## 2014-06-26 NOTE — Assessment & Plan Note (Signed)
Encouraged complete cessation. Discussed need to quit as relates to risk of numerous cancers, cardiac and pulmonary disease as well as neurologic complications. Counseled for greater than 3 minutes 

## 2014-08-28 ENCOUNTER — Ambulatory Visit (INDEPENDENT_AMBULATORY_CARE_PROVIDER_SITE_OTHER): Payer: Medicare HMO | Admitting: Nurse Practitioner

## 2014-08-28 ENCOUNTER — Encounter: Payer: Self-pay | Admitting: Nurse Practitioner

## 2014-08-28 VITALS — BP 138/82 | HR 89 | Temp 98.0°F | Wt 135.8 lb

## 2014-08-28 DIAGNOSIS — R05 Cough: Secondary | ICD-10-CM

## 2014-08-28 DIAGNOSIS — J3489 Other specified disorders of nose and nasal sinuses: Secondary | ICD-10-CM

## 2014-08-28 DIAGNOSIS — R059 Cough, unspecified: Secondary | ICD-10-CM

## 2014-08-28 DIAGNOSIS — R0981 Nasal congestion: Secondary | ICD-10-CM

## 2014-08-28 MED ORDER — HYDROCOD POLST-CHLORPHEN POLST 10-8 MG/5ML PO LQCR: 5.0000 mL | Freq: Every evening | ORAL | Status: DC | PRN

## 2014-08-28 NOTE — Patient Instructions (Signed)
Continue with the Coricidin as needed for productive cough.  Take the Tussionex at night for cough before bed as it will make you drowsy.  Stay hydrated and call us if it worsens or does not get better over the next week or so. Nice to meet you!

## 2014-08-28 NOTE — Progress Notes (Signed)
   Subjective:    Patient ID: Cynthia LintsCheryl Gubbels, female    DOB: [birth year], 63 y.o.   MRN: [id number]  HPI  [name] is a 63 yo female with a chief complaint of URI x 4 days.   1) URI- She began 4 days ago 11/5 with a left ear ache and left side sore throat. It then resolved and turned into nasal congestion, clear drainage, and productive cough. Currently she is experiencing hoarsess, feels run down, nasal congestion, and productive cough. The drainage and sputum are clear with a tinge of yellow intermittently. She reports 1 coworker with sinus issues and no other sick family or friends. Denies fever, feels colder but denies chills. Worse at night.    Treatment to date is Coricidin 3 x a day for the past three days. Reports it is helping a little. She tried 1 Aleve which helped a little, also.     Review of Systems  Constitutional: Negative for fever, chills, diaphoresis, fatigue and unexpected weight change.  Eyes: Negative for pain, discharge, redness, itching and visual disturbance.  Respiratory: Positive for cough. Negative for chest tightness, shortness of breath and wheezing.   Gastrointestinal: Negative for nausea, vomiting and diarrhea.  Musculoskeletal: Negative for myalgias.  Allergic/Immunologic: Negative for environmental allergies.  Neurological: Negative for headaches.       Objective:   Physical Exam  Constitutional: She is oriented to person, place, and time. She appears well-nourished. No distress.  HENT:  Head: Normocephalic.  Right Ear: External ear normal.  Left Ear: External ear normal.  Mouth/Throat: No oropharyngeal exudate.  Oropharynx is slightly red  Eyes: Conjunctivae and EOM are normal. Pupils are equal, round, and reactive to light. Right eye exhibits no discharge. Left eye exhibits no discharge. No scleral icterus.  Neck: Normal range of motion. Neck supple. No tracheal deviation present.  Cardiovascular: Normal rate, regular rhythm and normal heart  sounds.  Exam reveals no gallop and no friction rub.   No murmur heard. Pulmonary/Chest: Effort normal and breath sounds normal. She has no wheezes. She has no rales.  Lymphadenopathy:    She has no cervical adenopathy.  Neurological: She is alert and oriented to person, place, and time.  Skin: Skin is warm and dry. She is not diaphoretic.  Psychiatric: She has a normal mood and affect. Her behavior is normal. Judgment and thought content normal.          Assessment & Plan:

## 2014-08-28 NOTE — Assessment & Plan Note (Signed)
X 4 days of rhinorrhea, nasal congestion, and productive cough. Clear drainage and sputum. She is continuing her Coricidin use, but since she is not sleeping due to worsening cough at night she was prescribed Tussionex 5 mL at bedtime. She understands not to use during the day because of drowsiness. She will call us if it is not getting better after the next 4 days.

## 2014-08-30 ENCOUNTER — Telehealth: Payer: Self-pay | Admitting: Family Medicine

## 2014-08-30 NOTE — Telephone Encounter (Signed)
Caller name: Elnita MaxwellCheryl Relation to pt: self Call back number: [phone] [facility] [address]  Reason for call:   Patient states that cough medicine was going to cost $80 and did not get that. Patient has additional symptoms that she wants to talk about. Patient saw Naomie DeanCarrie Doss on Monday.

## 2014-08-31 MED ORDER — BENZONATATE 100 MG PO CAPS: 100.0000 mg | ORAL_CAPSULE | Freq: Two times a day (BID) | ORAL | Status: DC | PRN

## 2014-08-31 NOTE — Telephone Encounter (Signed)
Notified pt. She is unable to come in today and scheduled appt for 09/01/14 with Peggyann Juba'Sullivan, NP at 9:45am. [facility]

## 2014-08-31 NOTE — Telephone Encounter (Addendum)
Spoke with Cynthia Hubbard, she wants to know if there is a cheaper alternative for the tussionex. OTC Robitussin is not helping. Also states that symptoms are no better. Now both eyes are swollen with yellow mucous / drainage, itching and matting of both eyes. Cynthia Hubbard continues to have sinus drainage and cough.  Please advise.  Please call Cynthia Hubbard and let her know she needs to be seen since she is having new symptoms. Also, would you call in Tessalon capsules 100 mg take 1 by mouth three times daily as needed for cough. # 30 (thirty) with no refills. My e-prescribe is still not functional. Thanks.

## 2014-09-01 ENCOUNTER — Encounter: Payer: Self-pay | Admitting: Family

## 2014-09-01 ENCOUNTER — Ambulatory Visit (INDEPENDENT_AMBULATORY_CARE_PROVIDER_SITE_OTHER): Payer: Medicare HMO | Admitting: Family

## 2014-09-01 VITALS — BP 120/80 | HR 71 | Temp 97.8°F | Resp 16 | Ht 64.25 in | Wt 129.2 lb

## 2014-09-01 DIAGNOSIS — H109 Unspecified conjunctivitis: Secondary | ICD-10-CM

## 2014-09-01 DIAGNOSIS — J209 Acute bronchitis, unspecified: Secondary | ICD-10-CM | POA: Insufficient documentation

## 2014-09-01 MED ORDER — AZITHROMYCIN 250 MG PO TABS: ORAL_TABLET | ORAL | Status: DC

## 2014-09-01 MED ORDER — ALBUTEROL SULFATE HFA 108 (90 BASE) MCG/ACT IN AERS: 2.0000 | INHALATION_SPRAY | Freq: Four times a day (QID) | RESPIRATORY_TRACT | Status: DC | PRN

## 2014-09-01 MED ORDER — NEOMYCIN-POLYMYXIN-HC OP SUSP: OPHTHALMIC | Status: DC

## 2014-09-01 NOTE — Assessment & Plan Note (Signed)
Will Rx for albuterol inhaler.  May continue tessalon as needed.  Follow up as needed.

## 2014-09-01 NOTE — Assessment & Plan Note (Signed)
Will Rx for azithromycin.  Follow up as needed

## 2014-09-01 NOTE — Assessment & Plan Note (Signed)
Will Rx for Cortisporin opthalmic drops.  Follow up as needed.

## 2014-09-01 NOTE — Patient Instructions (Addendum)
You may use albuterol 2 puffs every 6 hours and/or tessalon as needed for cough. Start zpak for bronchitis/ear infection. Start cortisporin drops for conjunctivitis. Call if your symptoms worsen or if not improved in 2-3 days.

## 2014-09-01 NOTE — Progress Notes (Signed)
Pre visit review using our clinic review tool, if applicable. No additional management support is needed unless otherwise documented below in the visit note. 

## 2014-09-01 NOTE — Progress Notes (Signed)
Subjective:    Patient ID: Cynthia Hubbard, female    DOB: 05-Apr-1951, 63 y.o.   MRN: [id number]  HPI  [name] is a 63 year old female who presents today with chief complaint of itching and crusting in both eyes and hoarseness  1. Eye discharge and hoarseness - Was seen in this office on 11/9 with congestion, clear drainage and productive cough (at that time symptoms started 4 days ago). She was instructed to take Coricidin and has been taking this without relief.  A prescription was written for Tussionex and she did not pick it up due to cost.  She has taken Tessalon with minimal relief from cough.  Today she continues to have complaints of post nasal drip, cough, voice hoarseness.  Cough is productive with yellow sputum.  Her right eye was matted over yesterday morning and eyelids have been swollen for two days.    Review of Systems  Constitutional: Negative for fever, chills and unexpected weight change.  HENT: Positive for congestion, postnasal drip and rhinorrhea.   Respiratory: Positive for cough. Negative for shortness of breath and wheezing.   Cardiovascular: Negative for chest pain, palpitations and leg swelling.  Skin: Negative for color change, pallor and rash.   Past Medical History  Diagnosis Date  . Chicken pox 63 yrs old  . Measles 6 th grade    3 day measles  . Hyperlipidemia     weighed 30 pounds heavier- used to take crestor  . Hypertension     was 30 pounds heavier  . Other and unspecified hyperlipidemia 03/16/2014  . Retinal hemorrhage of right eye 03/16/2014  . Tobacco abuse disorder 03/16/2014  . HTN (hypertension) 03/16/2014    History   Social History  . Marital Status: Married    Spouse Name: N/A    Number of Children: N/A  . Years of Education: N/A   Occupational History  . Not on file.   Social History Main Topics  . Smoking status: Current Every Day Smoker -- 0.75 packs/day for 40 years    Types: Cigarettes  . Smokeless tobacco: Never Used  .  Alcohol Use: Yes     Comment: very seldom  . Drug Use: No  . Sexual Activity: Yes     Comment: lives with husband, no dietary restrictions, works part time at [employer]   Other Topics Concern  . Not on file   Social History Narrative    Past Surgical History  Procedure Laterality Date  . Refractive surgery Right     Family History  Problem Relation Age of Onset  . Adopted: Yes    No Known Allergies  Current Outpatient Prescriptions on File Prior to Visit  Medication Sig Dispense Refill  . benzonatate (TESSALON) 100 MG capsule Take 1 capsule (100 mg total) by mouth 2 (two) times daily as needed for cough. 20 capsule 0  . Cholecalciferol (VITAMIN D3) 5000 UNITS CAPS Take 1 capsule by mouth daily.    Marland Kitchen. lisinopril (PRINIVIL,ZESTRIL) 5 MG tablet   3  . OVER THE COUNTER MEDICATION Mega red daily    . simvastatin (ZOCOR) 10 MG tablet Take 1 tablet (10 mg total) by mouth daily. 30 tablet 3   No current facility-administered medications on file prior to visit.    BP 120/80 mmHg  Pulse 71  Temp(Src) 97.8 F (36.6 C) (Oral)  Resp 16  Ht 5' 4.25" (1.632 m)  Wt 129 lb 3.2 oz (58.605 kg)  BMI 22.00 kg/m2  SpO2 98%        Objective:   Physical Exam  Constitutional: She is oriented to person, place, and time. She appears well-developed and well-nourished.  HENT:  Head: Normocephalic and atraumatic.  Right Ear: External ear normal.  Left Ear: External ear normal. Tympanic membrane is erythematous.  Mouth/Throat: Oropharynx is clear and moist. No oropharyngeal exudate.  Eyes: Left conjunctiva is injected.  Neck: Normal range of motion.  Cardiovascular: Normal rate, regular rhythm and normal heart sounds.   Pulmonary/Chest: Effort normal and breath sounds normal.  Productive cough, voice hoarse.  Neurological: She is alert and oriented to person, place, and time.          Assessment & Plan:  I have personally seen and examined patient and agree with Suzzette RighterErin  Smith NP student's assessment and plan.   Will Rx for azithromycin, albuterol inhaler, and cortisporin opthalmic drops.

## 2014-09-05 ENCOUNTER — Telehealth: Payer: Self-pay | Admitting: Family Medicine

## 2014-09-05 NOTE — Telephone Encounter (Signed)
Caller name: Rosamaria LintsJarman, Tanekia Relation to pt: self  Call back number: [phone]   Reason for call: pt requesting a refill of antibiotics pt is not feeling better from last OV 09/01/14

## 2014-09-05 NOTE — Telephone Encounter (Signed)
Spoke with pt. She states she was feeling better yesterday. Skipped 2 doses of Coricidin, completed zpack this morning. Had coughing episode this morning. Head has stopped up again. Wants to take longer course of antibiotic to see if it will clear up her symptoms?  Please advise.

## 2014-09-05 NOTE — Telephone Encounter (Signed)
Notified pt and she voices understanding. 

## 2014-09-05 NOTE — Telephone Encounter (Signed)
The zithromax stays in her system for several days after completion and she took her last dose today so she is still covered. I would recommend that she resume coricidin, start flonase. Call me Friday AM if symptoms worsen or if no improvement.

## 2014-09-08 ENCOUNTER — Telehealth: Payer: Self-pay

## 2014-09-08 ENCOUNTER — Ambulatory Visit (HOSPITAL_BASED_OUTPATIENT_CLINIC_OR_DEPARTMENT_OTHER)
Admission: RE | Admit: 2014-09-08 | Discharge: 2014-09-08 | Disposition: A | Discharge status: Home / Self Care | Payer: Medicare HMO | Source: Ambulatory Visit | Attending: Medical | Admitting: Medical

## 2014-09-08 ENCOUNTER — Encounter: Payer: Self-pay | Admitting: Medical

## 2014-09-08 ENCOUNTER — Ambulatory Visit (INDEPENDENT_AMBULATORY_CARE_PROVIDER_SITE_OTHER): Payer: Medicare HMO | Admitting: Medical

## 2014-09-08 VITALS — BP 164/93 | HR 79 | Temp 98.8°F | Ht 64.25 in | Wt 129.0 lb

## 2014-09-08 DIAGNOSIS — J209 Acute bronchitis, unspecified: Secondary | ICD-10-CM

## 2014-09-08 DIAGNOSIS — R05 Cough: Secondary | ICD-10-CM | POA: Insufficient documentation

## 2014-09-08 DIAGNOSIS — I10 Essential (primary) hypertension: Secondary | ICD-10-CM

## 2014-09-08 DIAGNOSIS — R059 Cough, unspecified: Secondary | ICD-10-CM

## 2014-09-08 MED ORDER — BECLOMETHASONE DIPROPIONATE 40 MCG/ACT IN AERS: 2.0000 | INHALATION_SPRAY | Freq: Two times a day (BID) | RESPIRATORY_TRACT | Status: DC

## 2014-09-08 MED ORDER — LEVOFLOXACIN 500 MG PO TABS: 500.0000 mg | ORAL_TABLET | Freq: Every day | ORAL | Status: DC

## 2014-09-08 MED ORDER — BENZONATATE 200 MG PO CAPS: 200.0000 mg | ORAL_CAPSULE | Freq: Three times a day (TID) | ORAL | Status: DC | PRN

## 2014-09-08 NOTE — Telephone Encounter (Signed)
Rosamaria LintsCheryl Yakubov [phone] may leave message [facility]  Elnita MaxwellCheryl called to say she is getting some better, but still has a long ways to go, she is continuing her medicines, but does not have any more zpack or antibiotic. She feels like she needs more medicine to continue to get better. Can we call some in for her, she is concerned that she is still going to be sick for the Holiday and has company coming in. Elnita MaxwellCheryl is hoping that we could possible call this in this morning so she can pick up while she goes out to grocery store.

## 2014-09-08 NOTE — Assessment & Plan Note (Signed)
Pt bp up today but just one week ago much lower. Pt does have bp cuff at home.Will advise checking her bp and if high like today then restart her bp med. If low like last time then could stay off. Only one week since quite dramatic change. Pt does mention she has been drinking a lot of coffee today and smoking. No neuruologic signs or symptoms. And no chest pain.

## 2014-09-08 NOTE — Telephone Encounter (Signed)
[facility]ed [name] back [facility] and scheduled an appointment for this afternoon  To see [name].

## 2014-09-08 NOTE — Progress Notes (Signed)
Subjective:    Patient ID: Cynthia Hubbard, female    DOB: 1951-06-16, 63 y.o.   MRN: [id number]  HPI   Pt in for persisting bronchitis type symptoms. She states sinus pressure and ear pain persists.  She states has spells of productive cough. At night cough is a lot worse. Pt is not wheezing. Pt not diabetic. Pt does smoke about 10-15 cigarettes a day.  Sinus pressure pressure not as bad as before. Pt states that she felt ok up until 4th day of the zpack but then felt worse and symptoms have waxed and waned since then.  Her conjunctivitis has cleared.  Pt bp is elevated now. Pt thinks cause from a lot caffeine and nicotine. She stopped her bp med on the last visit per pt at instruction of pcp.  Pt is using flonase otc. Also on benzonatate.  Past Medical History  Diagnosis Date  . Chicken pox 63 yrs old  . Measles 6 th grade    3 day measles  . Hyperlipidemia     weighed 30 pounds heavier- used to take crestor  . Hypertension     was 30 pounds heavier  . Other and unspecified hyperlipidemia 03/16/2014  . Retinal hemorrhage of right eye 03/16/2014  . Tobacco abuse disorder 03/16/2014  . HTN (hypertension) 03/16/2014    History   Social History  . Marital Status: Married    Spouse Name: N/A    Number of Children: N/A  . Years of Education: N/A   Occupational History  . Not on file.   Social History Main Topics  . Smoking status: Current Every Day Smoker -- 0.75 packs/day for 40 years    Types: Cigarettes  . Smokeless tobacco: Never Used  . Alcohol Use: Yes     Comment: very seldom  . Drug Use: No  . Sexual Activity: Yes     Comment: lives with husband, no dietary restrictions, works part time at [employer]   Other Topics Concern  . Not on file   Social History Narrative    Past Surgical History  Procedure Laterality Date  . Refractive surgery Right     Family History  Problem Relation Age of Onset  . Adopted: Yes    No Known Allergies  Current  Outpatient Prescriptions on File Prior to Visit  Medication Sig Dispense Refill  . albuterol (PROVENTIL HFA;VENTOLIN HFA) 108 (90 BASE) MCG/ACT inhaler Inhale 2 puffs into the lungs every 6 (six) hours as needed for wheezing or shortness of breath. 1 Inhaler 0  . Cholecalciferol (VITAMIN D3) 5000 UNITS CAPS Take 1 capsule by mouth daily.    . fluticasone (FLONASE) 50 MCG/ACT nasal spray Place 2 sprays into both nostrils daily.    . NEOMYCIN-POLYMYXIN-HC, OPHTH, SUSP Apply 2 drops to both eyes eye 6 (six) times daily for 5-7 days. 7.5 mL 0  . OVER THE COUNTER MEDICATION Mega red daily    . simvastatin (ZOCOR) 10 MG tablet Take 1 tablet (10 mg total) by mouth daily. 30 tablet 3  . lisinopril (PRINIVIL,ZESTRIL) 5 MG tablet   3   No current facility-administered medications on file prior to visit.    BP 164/93 mmHg  Pulse 79  Temp(Src) 98.8 F (37.1 C) (Oral)  Ht 5' 4.25" (1.632 m)  Wt 129 lb (58.514 kg)  BMI 21.97 kg/m2  SpO2 95%      Review of Systems  Constitutional: Negative for fever, chills and fatigue.  HENT: Positive for congestion, ear  pain and sinus pressure. Negative for ear discharge, nosebleeds, postnasal drip, rhinorrhea, sore throat and trouble swallowing.   Respiratory: Positive for cough. Negative for chest tightness, shortness of breath and wheezing.   Cardiovascular: Negative for chest pain and palpitations.  Gastrointestinal: Negative for nausea, vomiting, abdominal pain, diarrhea and constipation.  Genitourinary: Negative for dysuria and flank pain.  Musculoskeletal: Negative for back pain.  Neurological: Negative for dizziness, tremors, seizures, syncope, weakness, light-headedness, numbness and headaches.  Hematological: Negative for adenopathy. Does not bruise/bleed easily.  Psychiatric/Behavioral: Negative for suicidal ideas, behavioral problems and dysphoric mood. The patient is not nervous/anxious.        Objective:   Physical Exam   General  Mental  Status - Alert. General Appearance - Well groomed. Not in acute distress.  Skin Rashes- No Rashes.  HEENT Head- Normal. Ear Auditory Canal - Left- Normal. Right - Normal.Tympanic Membrane- Left- Normal. Right- Normal. Eye Sclera/Conjunctiva- Left- Normal. Right- Normal. Nose & Sinuses Nasal Mucosa- Left- Not Boggy or Congested. Right- Not Boggy or Congested. Ethmoid sinus pressure. Mouth & Throat Lips: Upper Lip- Normal: no dryness, cracking, pallor, cyanosis, or vesicular eruption. Lower Lip-Normal: no dryness, cracking, pallor, cyanosis or vesicular eruption. Buccal Mucosa- Bilateral- No Aphthous ulcers. Oropharynx- No Discharge or Erythema. Tonsils: Characteristics- Bilateral- No Erythema or Congestion. Size/Enlargement- Bilateral- No enlargement. Discharge- bilateral-None.  Neck Neck- Supple. No Masses.   Chest and Lung Exam Auscultation: Breath Sounds:-Even and unlabored. But faint upper lobe rhonchi.  Cardiovascular Auscultation:Rythm- Regular, rate and rhythm. Murmurs & Other Heart Sounds:Ausculatation of the heart reveal- No Murmurs.  Lymphatic Head & Neck General Head & Neck Lymphatics: Bilateral: Description- No Localized lymphadenopathy.  Neurologic-  CN III- XII grossy intact.         Assessment & Plan:

## 2014-09-08 NOTE — Progress Notes (Signed)
Pre visit review using our clinic review tool, if applicable. No additional management support is needed unless otherwise documented below in the visit note. 

## 2014-09-08 NOTE — Patient Instructions (Addendum)
You have some persisting bronchitis and sinusitis signs/ symptoms. This is persisting likely related to your smoking. I will give levaquin antibiotic, benzonatate rx(higher dose), and continue your fluticasone. I am adding qvar inhaler as well since cough at night may be related to airway tightening/copd.  Also please get cxr today  Follow up 7-10 days or as needed.

## 2014-09-08 NOTE — Telephone Encounter (Signed)
I think she needs to be seen back in the office. May need chest x ray to rule out pneumonia.  Can we try to get her in to see edward?

## 2014-09-08 NOTE — Assessment & Plan Note (Signed)
Pt has some persisting bronchitis and sinusitis signs/ symptoms. This is persisting likely related to her  smoking. I will give levaquin antibiotic, benzonatate rx(higher dose), and continue the fluticasone. I am adding qvar inhaler as well since cough at night may be related to airway tightening/copd.  Also please get cxr today.

## 2014-11-22 ENCOUNTER — Telehealth: Payer: Self-pay | Admitting: Family Medicine

## 2014-11-22 MED ORDER — SIMVASTATIN 10 MG PO TABS: 10.0000 mg | ORAL_TABLET | Freq: Every day | ORAL | Status: DC

## 2014-11-22 NOTE — Telephone Encounter (Signed)
Last filled:  06/07/14 Amt: 30, 3 refills Last OV:  06/22/14   Filled x 1 month.  Pt made aware.    Next appointment scheduled for 12/21/14.

## 2014-11-22 NOTE — Telephone Encounter (Signed)
Caller name: Cynthia Hubbard, Cynthia Relation to pt: self  Call back number: [phone] [facility]:  [id number] - [place], [place] - [address] [facility] [address] [phone] (Phone) [phone])     Reason for call:  Pt requesting a 3 month refill simvastatin (ZOCOR) 10 MG table. Pt states she has 2 pills left. Please advise

## 2014-11-24 NOTE — Telephone Encounter (Signed)
Rx filled on (11/22/14).//AB/CMA

## 2014-12-15 IMAGING — CR DG CHEST 2V
2 series · 2 of 2 positions shown · non-contrast
Comparison: None.

CLINICAL DATA: Cough for 2 weeks

EXAM:
CHEST  2 VIEW

[w chest pa]
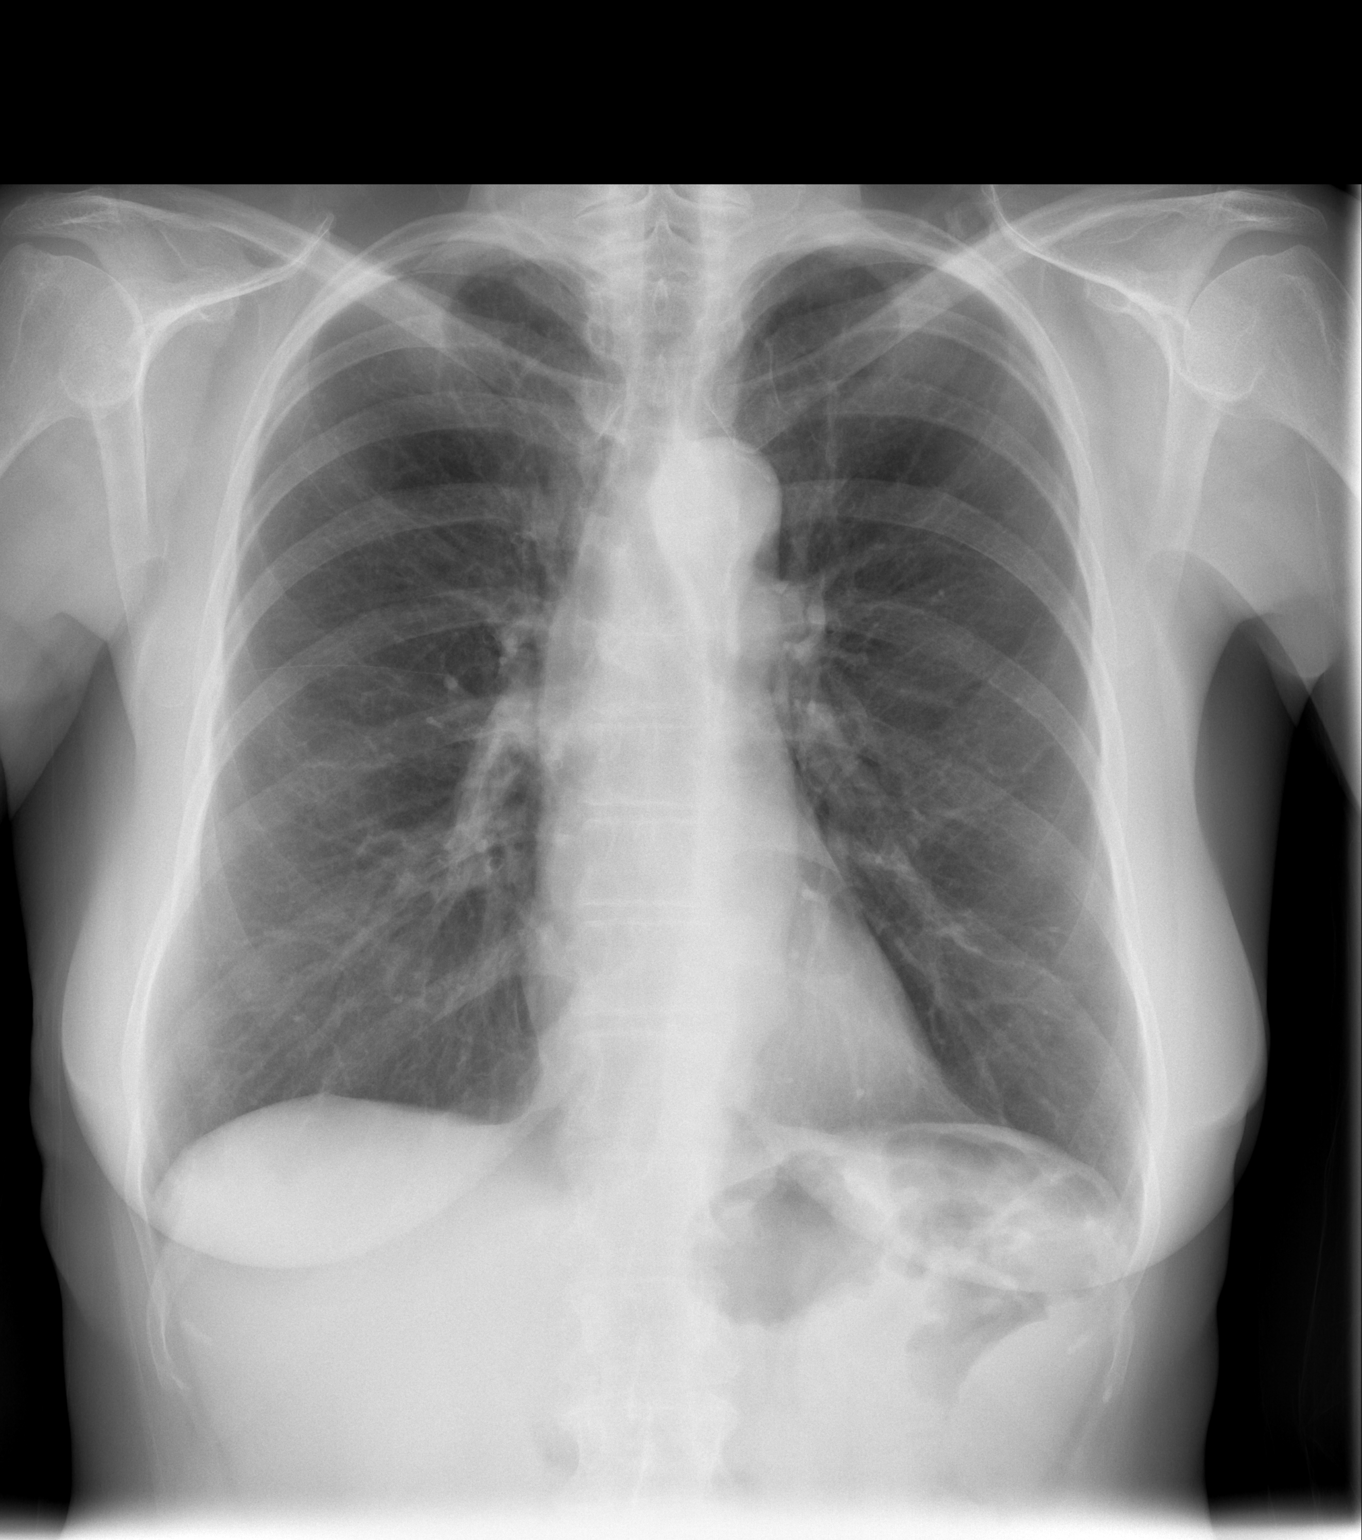

[w chest lat]
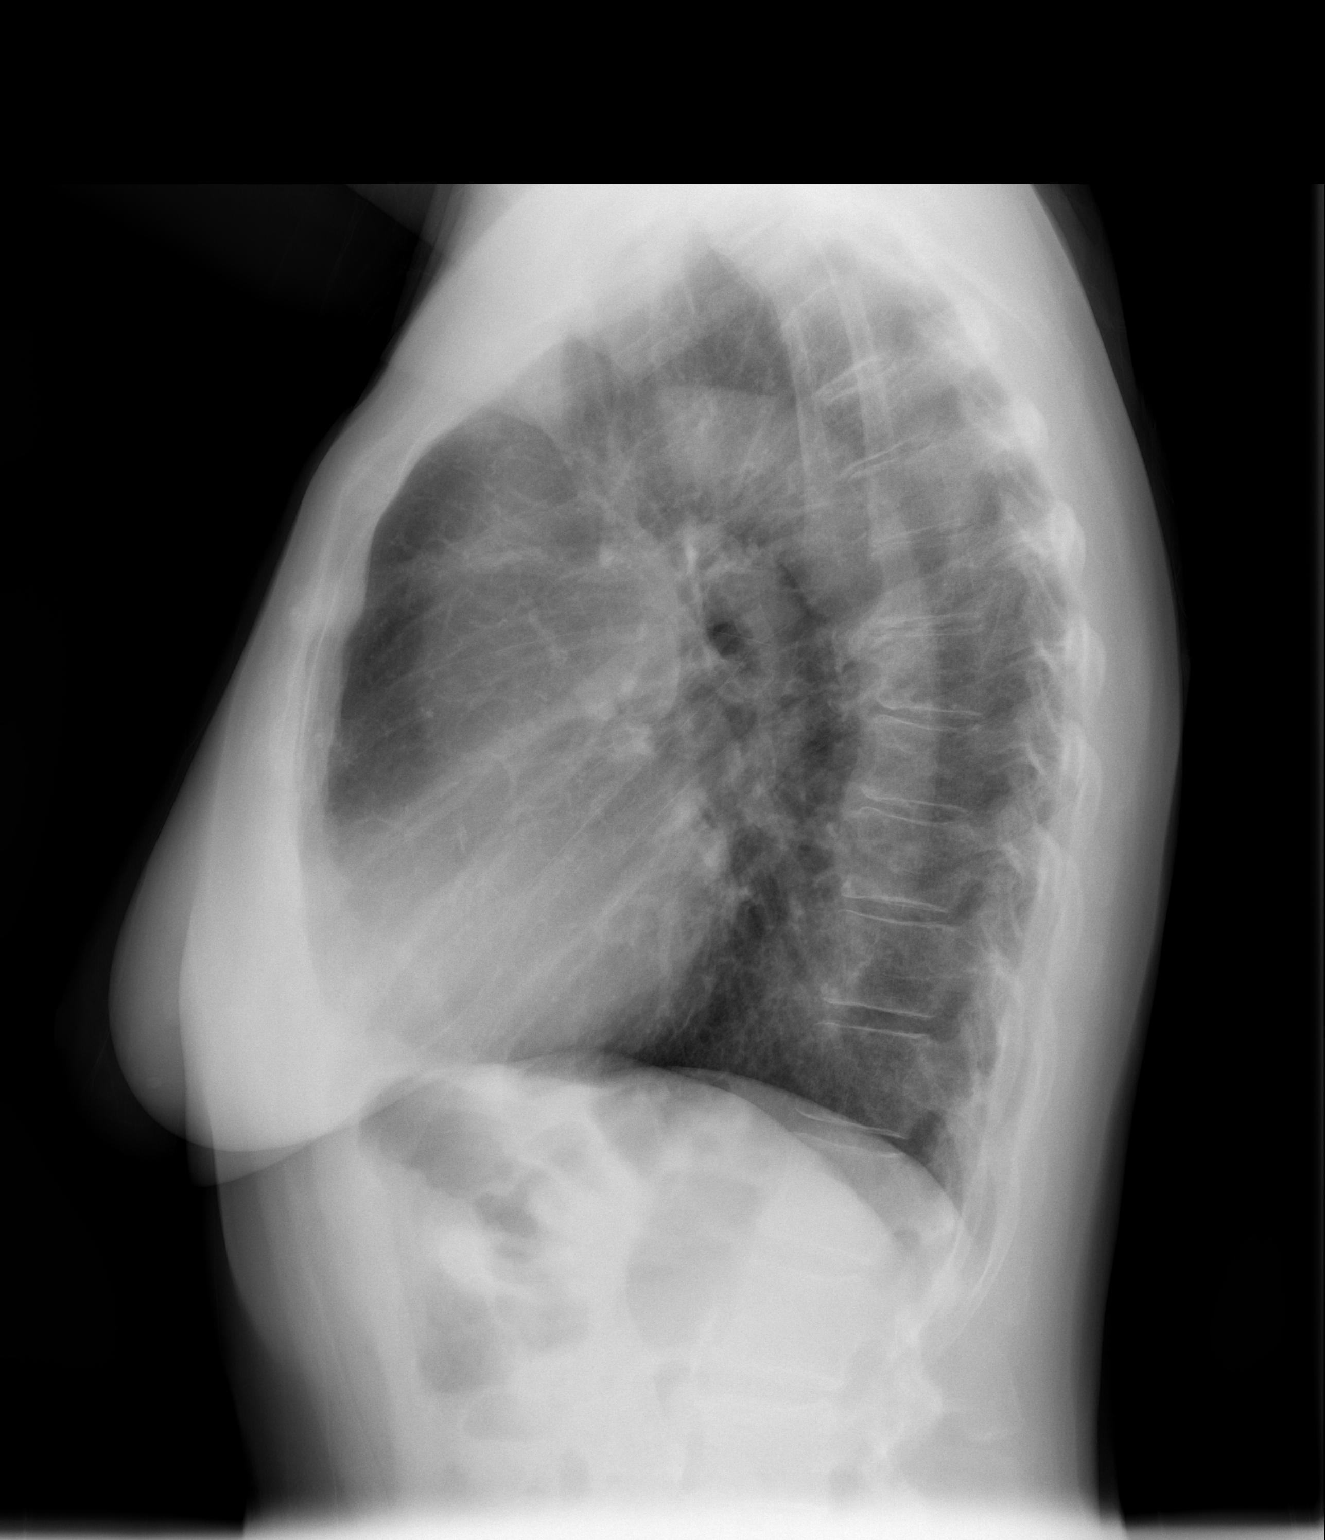

[2 of 2 positions shown; findings below may reference images not displayed]

FINDINGS: Normal cardiac silhouette. Normal pulmonary vasculature. Lungs are
hyperinflated. No effusion, infiltrate, or pneumothorax.
Degenerative osteophytosis of the thoracic spine..
IMPRESSION: Hyperinflated lungs.  No acute findings.

## 2014-12-18 ENCOUNTER — Telehealth: Payer: Self-pay | Admitting: Family Medicine

## 2014-12-18 MED ORDER — LISINOPRIL 5 MG PO TABS: 5.0000 mg | ORAL_TABLET | Freq: Every day | ORAL | Status: DC

## 2014-12-18 NOTE — Telephone Encounter (Signed)
Caller name: Elnita MaxwellCheryl Relationship to patient: self Can be reached:[phone] [facility] [place] Reason for call: Pt has appt on 3/3- will run out of lisinopril 5mg  before appt.

## 2014-12-18 NOTE — Telephone Encounter (Signed)
Refill done as the patient requested.  Called the patient informed refill sent in as requested. #90 day due to insurance request.

## 2014-12-19 ENCOUNTER — Other Ambulatory Visit (INDEPENDENT_AMBULATORY_CARE_PROVIDER_SITE_OTHER): Payer: Medicare HMO

## 2014-12-19 DIAGNOSIS — E785 Hyperlipidemia, unspecified: Secondary | ICD-10-CM

## 2014-12-19 DIAGNOSIS — I10 Essential (primary) hypertension: Secondary | ICD-10-CM

## 2014-12-19 LAB — RENAL FUNCTION PANEL
ALBUMIN: 4.1 g/dL (ref 3.5–5.2)
BUN: 12 mg/dL (ref 6–23)
CALCIUM: 9.3 mg/dL (ref 8.4–10.5)
CHLORIDE: 105 meq/L (ref 96–112)
CO2: 31 meq/L (ref 19–32)
Creatinine, Ser: 0.72 mg/dL (ref 0.40–1.20)
GFR: 86.26 mL/min (ref >60.00)
Glucose, Bld: 90 mg/dL (ref 70–99)
Phosphorus: 3.2 mg/dL (ref 2.3–4.6)
Potassium: 3.7 mEq/L (ref 3.5–5.1)
Sodium: 139 mEq/L (ref 135–145)

## 2014-12-19 LAB — HEPATIC FUNCTION PANEL
ALBUMIN: 4.1 g/dL (ref 3.5–5.2)
ALK PHOS: 77 U/L (ref 39–117)
ALT: 10 U/L (ref 0–35)
AST: 15 U/L (ref 0–37)
BILIRUBIN TOTAL: 0.3 mg/dL (ref 0.2–1.2)
Bilirubin, Direct: 0.1 mg/dL (ref 0.0–0.3)
Total Protein: 7.1 g/dL (ref 6.0–8.3)

## 2014-12-19 LAB — CBC WITH DIFFERENTIAL/PLATELET
BASOS ABS: 0 10*3/uL (ref 0.0–0.1)
Basophils Relative: 0.2 % (ref 0.0–3.0)
EOS ABS: 0.1 10*3/uL (ref 0.0–0.7)
Eosinophils Relative: 1.6 % (ref 0.0–5.0)
HCT: 37.9 % (ref 36.0–46.0)
Hemoglobin: 12.9 g/dL (ref 12.0–15.0)
LYMPHS PCT: 34.3 % (ref 12.0–46.0)
Lymphs Abs: 2 10*3/uL (ref 0.7–4.0)
MCHC: 33.9 g/dL (ref 30.0–36.0)
MCV: 80.1 fl (ref 78.0–100.0)
Monocytes Absolute: 0.2 10*3/uL (ref 0.1–1.0)
Monocytes Relative: 3.9 % (ref 3.0–12.0)
Neutro Abs: 3.5 10*3/uL (ref 1.4–7.7)
Neutrophils Relative %: 60 % (ref 43.0–77.0)
Platelets: 171 10*3/uL (ref 150.0–400.0)
RBC: 4.73 Mil/uL (ref 3.87–5.11)
RDW: 13.7 % (ref 11.5–15.5)
WBC: 5.8 10*3/uL (ref 4.0–10.5)

## 2014-12-19 LAB — LIPID PANEL
Cholesterol: 143 mg/dL (ref 0–200)
HDL: 34.5 mg/dL — ABNORMAL LOW (ref >39.00)
LDL Cholesterol: 70 mg/dL (ref 0–99)
NonHDL: 108.5
Total CHOL/HDL Ratio: 4
Triglycerides: 195 mg/dL — ABNORMAL HIGH (ref 0.0–149.0)
VLDL: 39 mg/dL (ref 0.0–40.0)

## 2014-12-19 LAB — TSH: TSH: 1.41 u[IU]/mL (ref 0.35–4.50)

## 2014-12-21 ENCOUNTER — Ambulatory Visit (INDEPENDENT_AMBULATORY_CARE_PROVIDER_SITE_OTHER): Payer: Medicare HMO | Admitting: Family Medicine

## 2014-12-21 ENCOUNTER — Encounter: Payer: Self-pay | Admitting: Family Medicine

## 2014-12-21 VITALS — BP 140/80 | HR 64 | Temp 97.9°F | Ht 64.0 in | Wt 126.2 lb

## 2014-12-21 DIAGNOSIS — E782 Mixed hyperlipidemia: Secondary | ICD-10-CM

## 2014-12-21 DIAGNOSIS — Z720 Tobacco use: Secondary | ICD-10-CM

## 2014-12-21 DIAGNOSIS — I10 Essential (primary) hypertension: Secondary | ICD-10-CM

## 2014-12-21 DIAGNOSIS — J209 Acute bronchitis, unspecified: Secondary | ICD-10-CM

## 2014-12-21 MED ORDER — LISINOPRIL 5 MG PO TABS: 5.0000 mg | ORAL_TABLET | Freq: Two times a day (BID) | ORAL | Status: DC

## 2014-12-21 MED ORDER — SIMVASTATIN 10 MG PO TABS: 10.0000 mg | ORAL_TABLET | Freq: Every day | ORAL | Status: DC

## 2014-12-21 NOTE — Patient Instructions (Signed)

## 2014-12-21 NOTE — Assessment & Plan Note (Signed)
Encouraged heart healthy diet, increase exercise, avoid trans fats, consider a krill oil cap daily 

## 2014-12-21 NOTE — Progress Notes (Signed)
Cynthia Hubbard  [id number] 1950-11-13 12/21/2014      Progress Note-Follow Up  Subjective  Chief Complaint  Chief Complaint  Patient presents with  . Follow-up    HPI  Patient is a 64 y.o. female in today for routine medical care. Respiratory symptoms have resolved status post treatment. She reports that her blood pressures at home have been running in the mid 140s over 70s and 80s. She denies any acute concerns other than some recent dry itchy skin. Denies CP/palp/SOB/HA/congestion/fevers/GI or GU c/o. Taking meds as prescribed  Past Medical History  Diagnosis Date  . Chicken pox 64 yrs old  . Measles 6 th grade    3 day measles  . Hyperlipidemia     weighed 30 pounds heavier- used to take crestor  . Hypertension     was 30 pounds heavier  . Other and unspecified hyperlipidemia 03/16/2014  . Retinal hemorrhage of right eye 03/16/2014  . Tobacco abuse disorder 03/16/2014  . HTN (hypertension) 03/16/2014    Past Surgical History  Procedure Laterality Date  . Refractive surgery Right     Family History  Problem Relation Age of Onset  . Adopted: Yes    History   Social History  . Marital Status: Married    Spouse Name: N/A  . Number of Children: N/A  . Years of Education: N/A   Occupational History  . Not on file.   Social History Main Topics  . Smoking status: Current Every Day Smoker -- 0.75 packs/day for 40 years    Types: Cigarettes  . Smokeless tobacco: Never Used  . Alcohol Use: Yes     Comment: very seldom  . Drug Use: No  . Sexual Activity: Yes     Comment: lives with husband, no dietary restrictions, works part time at [employer]   Other Topics Concern  . Not on file   Social History Narrative    Current Outpatient Prescriptions on File Prior to Visit  Medication Sig Dispense Refill  . Cholecalciferol (VITAMIN D3) 5000 UNITS CAPS Take 1 capsule by mouth daily.    Marland Kitchen lisinopril (PRINIVIL,ZESTRIL) 5 MG tablet Take 1 tablet (5 mg total) by  mouth daily. 90 tablet 3  . OVER THE COUNTER MEDICATION Mega red daily     No current facility-administered medications on file prior to visit.    No Known Allergies  Review of Systems  Review of Systems  Constitutional: Negative for fever and malaise/fatigue.  HENT: Negative for congestion.   Eyes: Negative for discharge.  Respiratory: Negative for shortness of breath.   Cardiovascular: Negative for chest pain, palpitations and leg swelling.  Gastrointestinal: Negative for nausea, abdominal pain and diarrhea.  Genitourinary: Negative for dysuria.  Musculoskeletal: Negative for falls.  Skin: Negative for rash.  Neurological: Negative for loss of consciousness and headaches.  Endo/Heme/Allergies: Negative for polydipsia.  Psychiatric/Behavioral: Negative for depression and suicidal ideas. The patient is not nervous/anxious and does not have insomnia.     Objective  BP 140/80 mmHg  Pulse 64  Temp(Src) 97.9 F (36.6 C) (Oral)  Ht  (1.626 m)  Wt 126 lb 4 oz (57.267 kg)  BMI 21.66 kg/m2  SpO2 95%  Physical Exam  Physical Exam  Constitutional: She is oriented to person, place, and time and well-developed, well-nourished, and in no distress. No distress.  HENT:  Head: Normocephalic and atraumatic.  Eyes: Conjunctivae are normal.  Neck: Neck supple. No thyromegaly present.  Cardiovascular: Normal rate, regular rhythm and  normal heart sounds.   No murmur heard. Pulmonary/Chest: Effort normal and breath sounds normal. She has no wheezes.  Abdominal: She exhibits no distension and no mass.  Musculoskeletal: She exhibits no edema.  Lymphadenopathy:    She has no cervical adenopathy.  Neurological: She is alert and oriented to person, place, and time.  Skin: Skin is warm and dry. No rash noted. She is not diaphoretic.  Psychiatric: Memory, affect and judgment normal.    Lab Results  Component Value Date   TSH 1.41 12/19/2014   Lab Results  Component Value Date    WBC 5.8 12/19/2014   HGB 12.9 12/19/2014   HCT 37.9 12/19/2014   MCV 80.1 12/19/2014   PLT 171.0 12/19/2014   Lab Results  Component Value Date   CREATININE 0.72 12/19/2014   BUN 12 12/19/2014   NA 139 12/19/2014   K 3.7 12/19/2014   CL 105 12/19/2014   CO2 31 12/19/2014   Lab Results  Component Value Date   ALT 10 12/19/2014   AST 15 12/19/2014   ALKPHOS 77 12/19/2014   BILITOT 0.3 12/19/2014   Lab Results  Component Value Date   CHOL 143 12/19/2014   Lab Results  Component Value Date   HDL 34.50* 12/19/2014   Lab Results  Component Value Date   LDLCALC 70 12/19/2014   Lab Results  Component Value Date   TRIG 195.0* 12/19/2014   Lab Results  Component Value Date   CHOLHDL 4 12/19/2014     Assessment & Plan  Tobacco abuse disorder Encouraged complete cessation. Discussed need to quit as relates to risk of numerous cancers, cardiac and pulmonary disease as well as neurologic complications. Counseled for greater than 3 minutes.   Acute bronchitis resolved   Hyperlipidemia, mixed Encouraged heart healthy diet, increase exercise, avoid trans fats, consider a krill oil cap daily   HTN (hypertension) inadequately controlled, will increase Lisinopril to bid. Encouraged heart healthy diet such as the DASH diet and exercise as tolerated.

## 2014-12-21 NOTE — Assessment & Plan Note (Signed)
resolved 

## 2014-12-21 NOTE — Progress Notes (Signed)
Pre visit review using our clinic review tool, if applicable. No additional management support is needed unless otherwise documented below in the visit note. 

## 2014-12-21 NOTE — Assessment & Plan Note (Signed)
Encouraged complete cessation. Discussed need to quit as relates to risk of numerous cancers, cardiac and pulmonary disease as well as neurologic complications. Counseled for greater than 3 minutes 

## 2014-12-31 NOTE — Assessment & Plan Note (Addendum)
inadequately controlled, will increase Lisinopril to bid. Encouraged heart healthy diet such as the DASH diet and exercise as tolerated.

## 2015-06-08 ENCOUNTER — Telehealth: Payer: Self-pay | Admitting: Family Medicine

## 2015-06-08 NOTE — Telephone Encounter (Signed)
pre visit letter mailed 06/07/15 °

## 2015-06-21 ENCOUNTER — Other Ambulatory Visit (INDEPENDENT_AMBULATORY_CARE_PROVIDER_SITE_OTHER): Payer: Medicare HMO

## 2015-06-21 ENCOUNTER — Ambulatory Visit (INDEPENDENT_AMBULATORY_CARE_PROVIDER_SITE_OTHER): Payer: Medicare HMO

## 2015-06-21 VITALS — BP 146/90 | HR 60 | Ht 64.25 in | Wt 130.2 lb

## 2015-06-21 DIAGNOSIS — E782 Mixed hyperlipidemia: Secondary | ICD-10-CM

## 2015-06-21 DIAGNOSIS — Z720 Tobacco use: Secondary | ICD-10-CM | POA: Diagnosis not present

## 2015-06-21 DIAGNOSIS — Z0000 Encounter for general adult medical examination without abnormal findings: Secondary | ICD-10-CM | POA: Diagnosis not present

## 2015-06-21 DIAGNOSIS — Z1239 Encounter for other screening for malignant neoplasm of breast: Secondary | ICD-10-CM

## 2015-06-21 DIAGNOSIS — J209 Acute bronchitis, unspecified: Secondary | ICD-10-CM

## 2015-06-21 DIAGNOSIS — Z780 Asymptomatic menopausal state: Secondary | ICD-10-CM

## 2015-06-21 DIAGNOSIS — I10 Essential (primary) hypertension: Secondary | ICD-10-CM | POA: Diagnosis not present

## 2015-06-21 LAB — COMPREHENSIVE METABOLIC PANEL
ALK PHOS: 71 U/L (ref 39–117)
ALT: 10 U/L (ref 0–35)
AST: 16 U/L (ref 0–37)
Albumin: 4 g/dL (ref 3.5–5.2)
BUN: 17 mg/dL (ref 6–23)
CO2: 29 mEq/L (ref 19–32)
Calcium: 9.5 mg/dL (ref 8.4–10.5)
Chloride: 105 mEq/L (ref 96–112)
Creatinine, Ser: 0.75 mg/dL (ref 0.40–1.20)
GFR: 82.17 mL/min (ref >60.00)
Glucose, Bld: 96 mg/dL (ref 70–99)
POTASSIUM: 4.3 meq/L (ref 3.5–5.1)
SODIUM: 139 meq/L (ref 135–145)
TOTAL PROTEIN: 7.3 g/dL (ref 6.0–8.3)
Total Bilirubin: 0.4 mg/dL (ref 0.2–1.2)

## 2015-06-21 LAB — LIPID PANEL
CHOLESTEROL: 158 mg/dL (ref 0–200)
HDL: 33.6 mg/dL — ABNORMAL LOW (ref >39.00)
LDL Cholesterol: 102 mg/dL — ABNORMAL HIGH (ref 0–99)
NonHDL: 124
Total CHOL/HDL Ratio: 5
Triglycerides: 109 mg/dL (ref 0.0–149.0)
VLDL: 21.8 mg/dL (ref 0.0–40.0)

## 2015-06-21 LAB — CBC
HCT: 38.3 % (ref 36.0–46.0)
HEMOGLOBIN: 12.9 g/dL (ref 12.0–15.0)
MCHC: 33.6 g/dL (ref 30.0–36.0)
MCV: 81.8 fl (ref 78.0–100.0)
Platelets: 163 10*3/uL (ref 150.0–400.0)
RBC: 4.68 Mil/uL (ref 3.87–5.11)
RDW: 13.6 % (ref 11.5–15.5)
WBC: 4.9 10*3/uL (ref 4.0–10.5)

## 2015-06-21 LAB — TSH: TSH: 1.09 u[IU]/mL (ref 0.35–4.50)

## 2015-06-21 NOTE — Progress Notes (Signed)
Have reviewed AWV documentation 

## 2015-06-21 NOTE — Patient Instructions (Addendum)
Complete mammogram, bone density, and colonoscopy.    Immunization Due:  Tetanus/tdap, Shingles, and Flu vaccine.  Increase physical activity.  Consider smoking cessation.    Follow up with Dr. Abner Greenspan as scheduled.      Bone Densitometry Bone densitometry is a special X-ray that measures your bone density and can be used to help predict your risk of bone fractures. This test is used to determine bone mineral content and density to diagnose osteoporosis. Osteoporosis is the loss of bone that may cause the bone to become weak. Osteoporosis commonly occurs in women entering menopause. However, it may be found in men and in people with other diseases. PREPARATION FOR TEST No preparation necessary. WHO SHOULD BE TESTED?  All women older than 16.  Postmenopausal women (50 to 55) with risk factors for osteoporosis.  People with a previous fracture caused by normal activities.  People with a small body frame (less than 127 poundsor a body mass index [BMI] of less than 21).  People who have a parent with a hip fracture or history of osteoporosis.  People who smoke.  People who have rheumatoid arthritis.  Anyone who engages in excessive alcohol use (more than 3 drinks most days).  Women who experience early menopause. WHEN SHOULD YOU BE RETESTED? Current guidelines suggest that you should wait at least 2 years before doing a bone density test again if your first test was normal.Recent studies indicated that women with normal bone density may be able to wait a few years before needing to repeat a bone density test. You should discuss this with your caregiver.  NORMAL FINDINGS   Normal: less than standard deviation below normal (greater than -1).  Osteopenia: 1 to 2.5 standard deviations below normal (-1 to -2.5).  Osteoporosis: greater than 2.5 standard deviations below normal (less than -2.5). Test results are reported as a "T score" and a "Z score."The T score is a number that  compares your bone density with the bone density of healthy, young women.The Z score is a number that compares your bone density with the scores of women who are the same age, gender, and race.  Ranges for normal findings may vary among different laboratories and hospitals. You should always check with your doctor after having lab work or other tests done to discuss the meaning of your test results and whether your values are considered within normal limits. MEANING OF TEST  Your caregiver will go over the test results with you and discuss the importance and meaning of your results, as well as treatment options and the need for additional tests if necessary. OBTAINING THE TEST RESULTS It is your responsibility to obtain your test results. Ask the lab or department performing the test when and how you will get your results. Document Released: 10/28/2004 Document Revised: 12/29/2011 Document Reviewed: 11/20/2010 [facility] Patient Information 2015 [place], [place]. This information is not intended to replace advice given to you by your health care provider. Make sure you discuss any questions you have with your health care provider.  Colonoscopy A colonoscopy is an exam to look at the entire large intestine (colon). This exam can help find problems such as tumors, polyps, inflammation, and areas of bleeding. The exam takes about 1 hour.  LET [facility] CARE PROVIDER KNOW ABOUT:   Any allergies you have.  All medicines you are taking, including vitamins, herbs, eye drops, creams, and over-the-counter medicines.  Previous problems you or members of your family have had with the use of  anesthetics.  Any blood disorders you have.  Previous surgeries you have had.  Medical conditions you have. RISKS AND COMPLICATIONS  Generally, this is a safe procedure. However, as with any procedure, complications can occur. Possible complications include:  Bleeding.  Tearing or rupture of the colon  wall.  Reaction to medicines given during the exam.  Infection (rare). BEFORE THE PROCEDURE   Ask your health care provider about changing or stopping your regular medicines.  You may be prescribed an oral bowel prep. This involves drinking a large amount of medicated liquid, starting the day before your procedure. The liquid will cause you to have multiple loose stools until your stool is almost clear or light green. This cleans out your colon in preparation for the procedure.  Do not eat or drink anything else once you have started the bowel prep, unless your health care provider tells you it is safe to do so.  Arrange for someone to drive you home after the procedure. PROCEDURE   You will be given medicine to help you relax (sedative).  You will lie on your side with your knees bent.  A long, flexible tube with a light and camera on the end (colonoscope) will be inserted through the rectum and into the colon. The camera sends video back to a computer screen as it moves through the colon. The colonoscope also releases carbon dioxide gas to inflate the colon. This helps your health care provider see the area better.  During the exam, your health care provider may take a small tissue sample (biopsy) to be examined under a microscope if any abnormalities are found.  The exam is finished when the entire colon has been viewed. AFTER THE PROCEDURE   Do not drive for 24 hours after the exam.  You may have a small amount of blood in your stool.  You may pass moderate amounts of gas and have mild abdominal cramping or bloating. This is caused by the gas used to inflate your colon during the exam.  Ask when your test results will be ready and how you will get your results. Make sure you get your test results. Document Released: 10/03/2000 Document Revised: 07/27/2013 Document Reviewed: 06/13/2013 [facility] [place] Patient Information 2015 [place], [place]. This information is not intended to replace  advice given to you by your health care provider. Make sure you discuss any questions you have with your health care provider.  Fall Prevention and Home Safety Falls cause injuries and can affect all age groups. It is possible to prevent falls.  HOW TO PREVENT FALLS  Wear shoes with rubber soles that do not have an opening for your toes.  Keep the inside and outside of your house well lit.  Use night lights throughout your home.  Remove clutter from floors.  Clean up floor spills.  Remove throw rugs or fasten them to the floor with carpet tape.  Do not place electrical cords across pathways.  Put grab bars by your tub, shower, and toilet. Do not use towel bars as grab bars.  Put handrails on both sides of the stairway. Fix loose handrails.  Do not climb on stools or stepladders, if possible.  Do not wax your floors.  Repair uneven or unsafe sidewalks, walkways, or stairs.  Keep items you use a lot within reach.  Be aware of pets.  Keep emergency numbers next to the telephone.  Put smoke detectors in your home and near bedrooms. Ask your doctor what other things you can  do to prevent falls. Document Released: 08/02/2009 Document Revised: 04/06/2012 Document Reviewed: 01/06/2012 [facility] Patient Information 2015 [place], [place]. This information is not intended to replace advice given to you by your health care provider. Make sure you discuss any questions you have with your health care provider.  Mammography  Mammography is an X-ray of the breasts. The X-ray image of the breast is called a mammogram. This test can:  Find changes in the breast that are not normal.  Look for early signs of cancer.  Find cancer.  Diagnose cancer. BEFORE THE PROCEDURE  Make your test about 7 days after your period (menses).  If you have a new doctor or clinic, send any past mammogram images to your new doctor's office.  Wash your breasts and under your arms the day of the  test.  Do not wear deodorant, perfume, or powder on your body.  Wear clothes that you can change in and out of easily. PROCEDURE  Try to relax during the test.   You will get undressed from the waist up. You will put on a gown.  You will stand in front of the X-ray machine.  Each breast will be placed between 2 plastic or glass plates. The plates will press down on your breast.  X-rays will be taken of the breast from different angles. The test should take less than 30 minutes. AFTER THE PROCEDURE  The X-ray image will be looked at carefully.  You may need to do certain parts of the test again if the images are unclear.  Ask when your test results will be ready. Get your test results.  You may go back to your normal activities. Document Released: 12/29/2011 Document Reviewed: 12/29/2011 [facility] Patient Information 2015 [place], [place]. This information is not intended to replace advice given to you by your health care provider. Make sure you discuss any questions you have with your health care provider.  You Can Quit Smoking If you are ready to quit smoking or are thinking about it, congratulations! You have chosen to help yourself be healthier and live longer! There are lots of different ways to quit smoking. Nicotine gum, nicotine patches, a nicotine inhaler, or nicotine nasal spray can help with physical craving. Hypnosis, support groups, and medicines help break the habit of smoking. TIPS TO GET OFF AND STAY OFF CIGARETTES  Learn to predict your moods. Do not let a bad situation be your excuse to have a cigarette. Some situations in your life might tempt you to have a cigarette.  Ask friends and co-workers not to smoke around you.  Make your home smoke-free.  Never have "just one" cigarette. It leads to wanting another and another. Remind yourself of your decision to quit.  On a card, make a list of your reasons for not smoking. Read it at least the same number of times a  day as you have a cigarette. Tell yourself everyday, "I do not want to smoke. I choose not to smoke."  Ask someone at home or work to help you with your plan to quit smoking.  Have something planned after you eat or have a cup of coffee. Take a walk or get other exercise to perk you up. This will help to keep you from overeating.  Try a relaxation exercise to calm you down and decrease your stress. Remember, you may be tense and nervous the first two weeks after you quit. This will pass.  Find new activities to keep your hands busy. Play  with a pen, coin, or rubber band. Doodle or draw things on paper.  Brush your teeth right after eating. This will help cut down the craving for the taste of tobacco after meals. You can try mouthwash too.  Try gum, breath mints, or diet candy to keep something in your mouth. IF YOU SMOKE AND WANT TO QUIT:  Do not stock up on cigarettes. Never buy a carton. Wait until one pack is finished before you buy another.  Never carry cigarettes with you at work or at home.  Keep cigarettes as far away from you as possible. Leave them with someone else.  Never carry matches or a lighter with you.  Ask yourself, "Do I need this cigarette or is this just a reflex?"  Bet with someone that you can quit. Put cigarette money in a piggy bank every morning. If you smoke, you give up the money. If you do not smoke, by the end of the week, you keep the money.  Keep trying. It takes 21 days to change a habit!  Talk to your doctor about using medicines to help you quit. These include nicotine replacement gum, lozenges, or skin patches. Document Released: 08/02/2009 Document Revised: 12/29/2011 Document Reviewed: 08/02/2009 [facility] Patient Information 2015 [place], [place]. This information is not intended to replace advice given to you by your health care provider. Make sure you discuss any questions you have with your health care provider.

## 2015-06-21 NOTE — Progress Notes (Signed)
Subjective:   Cynthia Hubbard is a 64 y.o. female who presents for Medicare Annual (Subsequent) preventive examination.  Review of Systems: No ROS  Sleep patterns:  Sleeps 5-6 hours per night/wakes up once during the night.    Home Safety/Smoke Alarms:  Lives at home with husband and dog. 2 story home.  Smoke and carbon monoxide alarms present.  Feels safe at home.   Firearm Safety: Discussed.  Keeps guns in a safe place.  Seat Belt Safety/Bike Helmet:  Always wears seat belt.    Counseling:   Eye Exam- Last eye exam:  Less than 1 year ago at [facility]. Pt cannot remember doctor's name.   Dental- Goes regularly.  Last exam- 2 months ago.   Female:  Pap- Dr. Abner Greenspan to do during next office visit.     Mammo- Plans to schedule. [facility]. Order placed.    Dexa scan- Discussed. [facility]. Order placed.  CCS- declined, willing to do cologuard or Ifobt.  Does not want a colonoscopy.  Does not want to be "put to sleep."  Immunization Due:  Tetanus/tdap, Shingles, and Flu vaccine- Postponed Hep C Screening Assessment completed: Yes to born between 110-1967. Declined screening at this time.     Objective:     Vitals: BP 146/90 mmHg  Pulse 60  Ht 5' 4.25" (1.632 m)  Wt 130 lb 3.2 oz (59.058 kg)  BMI 22.17 kg/m2  SpO2 96%  Tobacco History  Smoking status  . Current Every Day Smoker -- 0.75 packs/day for 40 years  . Types: Cigarettes  Smokeless tobacco  . Never Used     Ready to quit: No Counseling given: Yes   Past Medical History  Diagnosis Date  . Chicken pox 64 yrs old  . Measles 6 th grade    3 day measles  . Hyperlipidemia     weighed 30 pounds heavier- used to take crestor  . Hypertension     was 30 pounds heavier  . Other and unspecified hyperlipidemia 03/16/2014  . Retinal hemorrhage of right eye 03/16/2014  . Tobacco abuse disorder 03/16/2014  . HTN (hypertension) 03/16/2014   Past Surgical History  Procedure Laterality Date  . Refractive surgery Right    Family History    Problem Relation Age of Onset  . Adopted: Yes   History  Sexual Activity  . Sexual Activity: Yes    Comment: lives with husband, no dietary restrictions, works part time at a furniture store    Outpatient Encounter Prescriptions as of 06/21/2015  Medication Sig  . Cholecalciferol (VITAMIN D3) 5000 UNITS CAPS Take 1 capsule by mouth daily.  Marland Kitchen lisinopril (PRINIVIL,ZESTRIL) 5 MG tablet Take 1 tablet (5 mg total) by mouth 2 (two) times daily.  Marland Kitchen OVER THE COUNTER MEDICATION Mega red daily  . simvastatin (ZOCOR) 10 MG tablet Take 1 tablet (10 mg total) by mouth daily.   No facility-administered encounter medications on file as of 06/21/2015.    Activities of Daily Living In your present state of health, do you have any difficulty performing the following activities: 06/21/2015 12/21/2014  Hearing? N N  Vision? N N  Difficulty concentrating or making decisions? N N  Walking or climbing stairs? N N  Dressing or bathing? N N  Doing errands, shopping? N N  Preparing Food and eating ? N -  Using the Toilet? N -  In the past six months, have you accidently leaked urine? N -  Do you have problems with loss of bowel control? N -  Managing your Medications? N -  Managing your Finances? N -  Housekeeping or managing your Housekeeping? N -    Patient Care Team: Bradd Canary, MD as PCP - General (Family Medicine)  Dr. Doreene Adas DDS in [place]   Assessment:  Hypertension- Slight elevated.  On lisinopril.  Encouraged heart healthy, low sodium diet and exercise.    Mixed Hyperlipidemia- Last lipid panel drawn today. Encouraged heart healthy, low sodium diet and exercise.    Tobacco Use- Discussed smoking cessation.  Pt states she is not ready to quit at this time.  Patient education material given for patient's review.     Exercise Activities and Dietary recommendations Current Exercise Habits:: The patient does not participate in regular exercise at present   Diet:  Drinks lots of [employer]eps[employer].   Working to cut back on simple carbs.  Currently drinking more water. Eats lots of chicken.  She normally does not eat breakfast.  If she eats breakfast, does not eat lunch.  Dinner is typically largest meal of the three.    Goals    . Increase physical activity     Yoga and walking in park.        Fall Risk Fall Risk  06/21/2015 06/22/2014  Falls in the past year? Yes No  Number falls in past yr: 1 -  Injury with Fall? No -  Follow up Education provided -   Depression Screen PHQ 2/9 Scores 06/21/2015 06/22/2014  PHQ - 2 Score 0 0     Cognitive Testing MMSE - Mini Mental State Exam 06/21/2015  Orientation to time 5  Orientation to Place 5  Registration 3  Attention/ Calculation 5  Recall 3  Language- name 2 objects 2  Language- repeat 1  Language- follow 3 step command 3  Language- read & follow direction 1  Write a sentence 1  Copy design 1  Total score 30     There is no immunization history on file for this patient. Screening Tests Health Maintenance  Topic Date Due  . Hepatitis C Screening  02-07-1951  . TETANUS/TDAP  06/12/1968  . COLONOSCOPY  06/13/1999  . ZOSTAVAX  06/12/2009  . DEXA SCAN  06/12/2014  . PNA vac Low Risk Adult (1 of 2 - PCV13) 06/12/2014  . INFLUENZA VACCINE  05/21/2015  . MAMMOGRAM  05/16/2016    Plan:  Complete mammogram, bone density, and colonoscopy.    Immunization Due:  Tetanus/tdap, Shingles, and Flu vaccine.  Increase physical activity.  Consider smoking cessation.    Follow up with Dr. Abner Greenspan as scheduled    During the course of the visit the patient was educated and counseled about the following appropriate screening and preventive services:   Vaccines to include Pneumoccal, Influenza, Hepatitis B, Td, Zostavax, HCV  Electrocardiogram  Cardiovascular Disease  Colorectal cancer screening  Bone density screening  Diabetes screening  Glaucoma screening  Mammography/PAP  Nutrition counseling   Patient Instructions  (the written plan) was given to the patient.   Tylene Fantasia, RN  06/21/2015

## 2015-06-21 NOTE — Progress Notes (Signed)
Pre visit review using our clinic review tool, if applicable. No additional management support is needed unless otherwise documented below in the visit note. 

## 2015-06-28 ENCOUNTER — Encounter: Payer: Medicare HMO | Admitting: Family Medicine

## 2015-07-05 LAB — HM DEXA SCAN

## 2015-07-05 LAB — HM MAMMOGRAPHY

## 2015-07-10 ENCOUNTER — Encounter: Payer: Self-pay | Admitting: Family Medicine

## 2015-07-19 ENCOUNTER — Encounter: Payer: Self-pay | Admitting: Family Medicine

## 2015-07-24 ENCOUNTER — Encounter: Payer: Self-pay | Admitting: Family Medicine

## 2015-07-24 ENCOUNTER — Telehealth: Payer: Self-pay | Admitting: Family Medicine

## 2015-07-24 NOTE — Telephone Encounter (Signed)
Called the patient informed of Bone Density Results. Osteopenia Will check vitamin D at next OV. Take vitamin D 2000 IU's per day. Quit smoking. Calcium citrate tid, food is best.   Abstracted result into her chart. Patient informed of results and PCP instructions.

## 2015-09-03 ENCOUNTER — Telehealth: Payer: Self-pay

## 2015-09-03 NOTE — Telephone Encounter (Signed)
Pre Visit call completed. 

## 2015-09-04 ENCOUNTER — Encounter: Payer: Self-pay | Admitting: Family Medicine

## 2015-09-04 ENCOUNTER — Other Ambulatory Visit (HOSPITAL_COMMUNITY)
Admission: RE | Admit: 2015-09-04 | Discharge: 2015-09-04 | Disposition: A | Discharge status: Home / Self Care | Payer: Medicare HMO | Source: Ambulatory Visit | Attending: Family Medicine | Admitting: Family Medicine

## 2015-09-04 ENCOUNTER — Ambulatory Visit (INDEPENDENT_AMBULATORY_CARE_PROVIDER_SITE_OTHER): Payer: Medicare HMO | Admitting: Family Medicine

## 2015-09-04 VITALS — BP 132/82 | HR 68 | Temp 98.3°F | Ht 64.0 in | Wt 131.5 lb

## 2015-09-04 DIAGNOSIS — Z8619 Personal history of other infectious and parasitic diseases: Secondary | ICD-10-CM

## 2015-09-04 DIAGNOSIS — M8580 Other specified disorders of bone density and structure, unspecified site: Secondary | ICD-10-CM

## 2015-09-04 DIAGNOSIS — F17210 Nicotine dependence, cigarettes, uncomplicated: Secondary | ICD-10-CM | POA: Diagnosis not present

## 2015-09-04 DIAGNOSIS — Z720 Tobacco use: Secondary | ICD-10-CM

## 2015-09-04 DIAGNOSIS — Z124 Encounter for screening for malignant neoplasm of cervix: Secondary | ICD-10-CM | POA: Diagnosis not present

## 2015-09-04 DIAGNOSIS — E782 Mixed hyperlipidemia: Secondary | ICD-10-CM

## 2015-09-04 DIAGNOSIS — I10 Essential (primary) hypertension: Secondary | ICD-10-CM

## 2015-09-04 DIAGNOSIS — Z0000 Encounter for general adult medical examination without abnormal findings: Secondary | ICD-10-CM | POA: Diagnosis not present

## 2015-09-04 NOTE — Progress Notes (Signed)
Subjective:    Patient ID: Cynthia Hubbard, female    DOB: [birth year], 64 y.o.   MRN: [id number]  Chief Complaint  Patient presents with  . Annual Exam    HPI Patient is in today for annual exam and follow up on several concerns. She has been under a great deal of stress lately with family concerns. No recent acute illness or other concerns. Has not been maintaining an exercise routine. Eating a heart healthy diet. Denies CP/palp/SOB/HA/congestion/fevers/GI or GU c/o. Taking meds as prescribed  Past Medical History  Diagnosis Date  . Chicken pox 64 yrs old  . Measles 6 th grade    3 day measles  . Hyperlipidemia     weighed 30 pounds heavier- used to take crestor  . Hypertension     was 30 pounds heavier  . Other and unspecified hyperlipidemia 03/16/2014  . Retinal hemorrhage of right eye 03/16/2014  . Tobacco abuse disorder 03/16/2014  . HTN (hypertension) 03/16/2014  . History of chicken pox   . H/O measles     3 day measles   . Osteopenia 09/14/2015    Past Surgical History  Procedure Laterality Date  . Refractive surgery Right     Family History  Problem Relation Age of Onset  . Adopted: Yes    Social History   Social History  . Marital Status: Married    Spouse Name: N/A  . Number of Children: N/A  . Years of Education: N/A   Occupational History  . Not on file.   Social History Main Topics  . Smoking status: Current Every Day Smoker -- 0.75 packs/day for 40 years    Types: Cigarettes  . Smokeless tobacco: Never Used  . Alcohol Use: Yes     Comment: very seldom  . Drug Use: No  . Sexual Activity: Yes     Comment: lives with husband, no dietary restrictions, works part time at [employer]   Other Topics Concern  . Not on file   Social History Narrative    Outpatient Prescriptions Prior to Visit  Medication Sig Dispense Refill  . Cholecalciferol (VITAMIN D3) 5000 UNITS CAPS Take 1 capsule by mouth daily.    Marland Kitchen lisinopril (PRINIVIL,ZESTRIL) 5  MG tablet Take 1 tablet (5 mg total) by mouth 2 (two) times daily. 180 tablet 3  . OVER THE COUNTER MEDICATION Mega red daily    . simvastatin (ZOCOR) 10 MG tablet Take 1 tablet (10 mg total) by mouth daily. 90 tablet 3   No facility-administered medications prior to visit.    No Known Allergies  Review of Systems  Constitutional: Negative for fever, chills and malaise/fatigue.  HENT: Negative for congestion and hearing loss.   Eyes: Negative for discharge.  Respiratory: Negative for cough, sputum production and shortness of breath.   Cardiovascular: Negative for chest pain, palpitations and leg swelling.  Gastrointestinal: Negative for heartburn, nausea, vomiting, abdominal pain, diarrhea, constipation and blood in stool.  Genitourinary: Negative for dysuria, urgency, frequency and hematuria.  Musculoskeletal: Negative for myalgias, back pain and falls.  Skin: Negative for rash.  Neurological: Negative for dizziness, sensory change, loss of consciousness, weakness and headaches.  Endo/Heme/Allergies: Negative for environmental allergies. Does not bruise/bleed easily.  Psychiatric/Behavioral: Negative for depression and suicidal ideas. The patient is not nervous/anxious and does not have insomnia.        Objective:    Physical Exam  Constitutional: She is oriented to person, place, and time. She appears well-developed and well-nourished.  No distress.  HENT:  Head: Normocephalic and atraumatic.  Eyes: Conjunctivae are normal.  Neck: Neck supple. No thyromegaly present.  Cardiovascular: Normal rate, regular rhythm and normal heart sounds.   No murmur heard. Pulmonary/Chest: Effort normal and breath sounds normal. No respiratory distress.  Abdominal: Soft. Bowel sounds are normal. She exhibits no distension and no mass. There is no tenderness.  Genitourinary: Vagina normal. No vaginal discharge found.  No vulvar, vaginal or cervical lesions. Breast exam unremarkable no masses,  skin lesions or discharge  Musculoskeletal: She exhibits no edema.  Lymphadenopathy:    She has no cervical adenopathy.  Neurological: She is alert and oriented to person, place, and time.  Skin: Skin is warm and dry.  Psychiatric: She has a normal mood and affect. Her behavior is normal.    BP 132/82 mmHg  Pulse 68  Temp(Src) 98.3 F (36.8 C) (Oral)  Ht 5\' 4"  (1.626 m)  Wt 131 lb 8 oz (59.648 kg)  BMI 22.56 kg/m2  SpO2 97% Wt Readings from Last 3 Encounters:  09/04/15 131 lb 8 oz (59.648 kg)  06/21/15 130 lb 3.2 oz (59.058 kg)  12/21/14 126 lb 4 oz (57.267 kg)     Lab Results  Component Value Date   WBC 4.9 06/21/2015   HGB 12.9 06/21/2015   HCT 38.3 06/21/2015   PLT 163.0 06/21/2015   GLUCOSE 96 06/21/2015   CHOL 158 06/21/2015   TRIG 109.0 06/21/2015   HDL 33.60* 06/21/2015   LDLCALC 102* 06/21/2015   ALT 10 06/21/2015   AST 16 06/21/2015   NA 139 06/21/2015   K 4.3 06/21/2015   CL 105 06/21/2015   CREATININE 0.75 06/21/2015   BUN 17 06/21/2015   CO2 29 06/21/2015   TSH 1.09 06/21/2015    Lab Results  Component Value Date   TSH 1.09 06/21/2015   Lab Results  Component Value Date   WBC 4.9 06/21/2015   HGB 12.9 06/21/2015   HCT 38.3 06/21/2015   MCV 81.8 06/21/2015   PLT 163.0 06/21/2015   Lab Results  Component Value Date   NA 139 06/21/2015   K 4.3 06/21/2015   CO2 29 06/21/2015   GLUCOSE 96 06/21/2015   BUN 17 06/21/2015   CREATININE 0.75 06/21/2015   BILITOT 0.4 06/21/2015   ALKPHOS 71 06/21/2015   AST 16 06/21/2015   ALT 10 06/21/2015   PROT 7.3 06/21/2015   ALBUMIN 4.0 06/21/2015   CALCIUM 9.5 06/21/2015   GFR 82.17 06/21/2015   Lab Results  Component Value Date   CHOL 158 06/21/2015   Lab Results  Component Value Date   HDL 33.60* 06/21/2015   Lab Results  Component Value Date   LDLCALC 102* 06/21/2015   Lab Results  Component Value Date   TRIG 109.0 06/21/2015   Lab Results  Component Value Date   CHOLHDL 5  06/21/2015   No results found for: HGBA1C     Assessment & Plan:   Problem List Items Addressed This Visit    Encounter for screening for cervical cancer     Pap today, no concerns on exam.       History of chicken pox    Patient offered Zostavax and declines, declines tdap and PNA vaccination as well      HTN (hypertension)    Well controlled, no changes to meds. Encouraged heart healthy diet such as the DASH diet and exercise as tolerated.       Relevant Orders  TSH   CBC   Comprehensive metabolic panel   Lipid panel   Hyperlipidemia, mixed    Tolerating statin, encouraged heart healthy diet, avoid trans fats, minimize simple carbs and saturated fats. Increase exercise as tolerated      Relevant Orders   TSH   CBC   Comprehensive metabolic panel   Lipid panel   Osteopenia    Bone density shows osteopenia, which is thinner than normal but not as bad as osteoporosis. Recommend calcium intake of 1200 to 1500 mg daily, divided into roughly 3 doses. Best source is the diet and a single dairy serving is about 500 mg, a supplement of calcium citrate once or twice daily to balance diet is fine if not getting enough in diet. Also need Vitamin D 2000 IU caps, 1 cap daily if not already taking vitamin D. Also recommend weight baring exercise on hips and upper body to keep bones strong      Preventative health care - Primary    Patient encouraged to maintain heart healthy diet, regular exercise, adequate sleep. Consider daily probiotics. Take medications as prescribed. Labs reviewed Allergies verified: Verified  Immunization Status: Reviewed with patient Prompted for insurance verification: Yes Flu vaccine-- Refuses  Tdap--Never had per patient PNA--Never had per patient Shingles--Never had per patient   Changes to FH, PSH or Personal Hx: Pap-- Will have during visit  MMG-- Had 06-2015 Normal Bone Density-- Completed LFT=0.788 CCS--Has not had per patient  Care Teams  Updated: ED/Hospital/Urgent Care Visits: None per patient Prompted for: Updated insurance, contact information, forms: Will update upon arrival per patient Remind to bring: DPR information, advance directives: Yes  Labs reviewed      Relevant Orders   TSH   CBC   Comprehensive metabolic panel   Lipid panel   Tobacco abuse disorder    Encouraged complete cessation. Discussed need to quit as relates to risk of numerous cancers, cardiac and pulmonary disease as well as neurologic complications. Counseled for greater than 3 minutes       Other Visit Diagnoses    Cervical cancer screening        Relevant Orders    Cytology - PAP (Completed)    TSH    CBC    Comprehensive metabolic panel    Lipid panel       I am having Ms. Marulanda maintain her OVER THE COUNTER MEDICATION, Vitamin D3, simvastatin, lisinopril, Probiotic Product (PROBIOTIC DAILY PO), and BIOTIN PO.  Meds ordered this encounter  Medications  . Probiotic Product (PROBIOTIC DAILY PO)    Sig: Take by mouth daily.  Marland Kitchen BIOTIN PO    Sig: Take by mouth daily.     Danise Edge, MD

## 2015-09-04 NOTE — Progress Notes (Signed)
Pre visit review using our clinic review tool, if applicable. No additional management support is needed unless otherwise documented below in the visit note. 

## 2015-09-04 NOTE — Patient Instructions (Signed)
Call insurance company and see if they will pay for Cologuard   Preventive Care for Adults, Female A healthy lifestyle and preventive care can promote health and wellness. Preventive health guidelines for women include the following key practices.  A routine yearly physical is a good way to check with your health care provider about your health and preventive screening. It is a chance to share any concerns and updates on your health and to receive a thorough exam.  Visit your dentist for a routine exam and preventive care every 6 months. Brush your teeth twice a day and floss once a day. Good oral hygiene prevents tooth decay and gum disease.  The frequency of eye exams is based on your age, health, family medical history, use of contact lenses, and other factors. Follow your health care provider's recommendations for frequency of eye exams.  Eat a healthy diet. Foods like vegetables, fruits, whole grains, low-fat dairy products, and lean protein foods contain the nutrients you need without too many calories. Decrease your intake of foods high in solid fats, added sugars, and salt. Eat the right amount of calories for you.Get information about a proper diet from your health care provider, if necessary.  Regular physical exercise is one of the most important things you can do for your health. Most adults should get at least 150 minutes of moderate-intensity exercise (any activity that increases your heart rate and causes you to sweat) each week. In addition, most adults need muscle-strengthening exercises on 2 or more days a week.  Maintain a healthy weight. The body mass index (BMI) is a screening tool to identify possible weight problems. It provides an estimate of body fat based on height and weight. Your health care provider can find your BMI and can help you achieve or maintain a healthy weight.For adults 20 years and older:  A BMI below 18.5 is considered underweight.  A BMI of 18.5 to 24.9  is normal.  A BMI of 25 to 29.9 is considered overweight.  A BMI of 30 and above is considered obese.  Maintain normal blood lipids and cholesterol levels by exercising and minimizing your intake of saturated fat. Eat a balanced diet with plenty of fruit and vegetables. Blood tests for lipids and cholesterol should begin at age 61 and be repeated every 5 years. If your lipid or cholesterol levels are high, you are over 50, or you are at high risk for heart disease, you may need your cholesterol levels checked more frequently.Ongoing high lipid and cholesterol levels should be treated with medicines if diet and exercise are not working.  If you smoke, find out from your health care provider how to quit. If you do not use tobacco, do not start.  Lung cancer screening is recommended for adults aged 67-80 years who are at high risk for developing lung cancer because of a history of smoking. A yearly low-dose CT scan of the lungs is recommended for people who have at least a 30-pack-year history of smoking and are a current smoker or have quit within the past 15 years. A pack year of smoking is smoking an average of 1 pack of cigarettes a day for 1 year (for example: 1 pack a day for 30 years or 2 packs a day for 15 years). Yearly screening should continue until the smoker has stopped smoking for at least 15 years. Yearly screening should be stopped for people who develop a health problem that would prevent them from having lung cancer  treatment.  If you are pregnant, do not drink alcohol. If you are breastfeeding, be very cautious about drinking alcohol. If you are not pregnant and choose to drink alcohol, do not have more than 1 drink per day. One drink is considered to be 12 ounces (355 mL) of beer, 5 ounces (148 mL) of wine, or 1.5 ounces (44 mL) of liquor.  Avoid use of street drugs. Do not share needles with anyone. Ask for help if you need support or instructions about stopping the use of  drugs.  High blood pressure causes heart disease and increases the risk of stroke. Your blood pressure should be checked at least every 1 to 2 years. Ongoing high blood pressure should be treated with medicines if weight loss and exercise do not work.  If you are 75-48 years old, ask your health care provider if you should take aspirin to prevent strokes.  Diabetes screening is done by taking a blood sample to check your blood glucose level after you have not eaten for a certain period of time (fasting). If you are not overweight and you do not have risk factors for diabetes, you should be screened once every 3 years starting at age 61. If you are overweight or obese and you are 62-45 years of age, you should be screened for diabetes every year as part of your cardiovascular risk assessment.  Breast cancer screening is essential preventive care for women. You should practice "breast self-awareness." This means understanding the normal appearance and feel of your breasts and may include breast self-examination. Any changes detected, no matter how small, should be reported to a health care provider. Women in their 3s and 30s should have a clinical breast exam (CBE) by a health care provider as part of a regular health exam every 1 to 3 years. After age 90, women should have a CBE every year. Starting at age 77, women should consider having a mammogram (breast X-ray test) every year. Women who have a family history of breast cancer should talk to their health care provider about genetic screening. Women at a high risk of breast cancer should talk to their health care providers about having an MRI and a mammogram every year.  Breast cancer gene (BRCA)-related cancer risk assessment is recommended for women who have family members with BRCA-related cancers. BRCA-related cancers include breast, ovarian, tubal, and peritoneal cancers. Having family members with these cancers may be associated with an increased  risk for harmful changes (mutations) in the breast cancer genes BRCA1 and BRCA2. Results of the assessment will determine the need for genetic counseling and BRCA1 and BRCA2 testing.  Your health care provider may recommend that you be screened regularly for cancer of the pelvic organs (ovaries, uterus, and vagina). This screening involves a pelvic examination, including checking for microscopic changes to the surface of your cervix (Pap test). You may be encouraged to have this screening done every 3 years, beginning at age 53.  For women ages 44-65, health care providers may recommend pelvic exams and Pap testing every 3 years, or they may recommend the Pap and pelvic exam, combined with testing for human papilloma virus (HPV), every 5 years. Some types of HPV increase your risk of cervical cancer. Testing for HPV may also be done on women of any age with unclear Pap test results.  Other health care providers may not recommend any screening for nonpregnant women who are considered low risk for pelvic cancer and who do not have symptoms.  Ask your health care provider if a screening pelvic exam is right for you.  If you have had past treatment for cervical cancer or a condition that could lead to cancer, you need Pap tests and screening for cancer for at least 20 years after your treatment. If Pap tests have been discontinued, your risk factors (such as having a new sexual partner) need to be reassessed to determine if screening should resume. Some women have medical problems that increase the chance of getting cervical cancer. In these cases, your health care provider may recommend more frequent screening and Pap tests.  Colorectal cancer can be detected and often prevented. Most routine colorectal cancer screening begins at the age of 35 years and continues through age 56 years. However, your health care provider may recommend screening at an earlier age if you have risk factors for colon cancer. On a  yearly basis, your health care provider may provide home test kits to check for hidden blood in the stool. Use of a small camera at the end of a tube, to directly examine the colon (sigmoidoscopy or colonoscopy), can detect the earliest forms of colorectal cancer. Talk to your health care provider about this at age 75, when routine screening begins. Direct exam of the colon should be repeated every 5-10 years through age 76 years, unless early forms of precancerous polyps or small growths are found.  People who are at an increased risk for hepatitis B should be screened for this virus. You are considered at high risk for hepatitis B if:  You were born in a country where hepatitis B occurs often. Talk with your health care provider about which countries are considered high risk.  Your parents were born in a high-risk country and you have not received a shot to protect against hepatitis B (hepatitis B vaccine).  You have HIV or AIDS.  You use needles to inject street drugs.  You live with, or have sex with, someone who has hepatitis B.  You get hemodialysis treatment.  You take certain medicines for conditions like cancer, organ transplantation, and autoimmune conditions.  Hepatitis C blood testing is recommended for all people born from [birth year] through 1965 and any individual with known risks for hepatitis C.  Practice safe sex. Use condoms and avoid high-risk sexual practices to reduce the spread of sexually transmitted infections (STIs). STIs include gonorrhea, chlamydia, syphilis, trichomonas, herpes, HPV, and human immunodeficiency virus (HIV). Herpes, HIV, and HPV are viral illnesses that have no cure. They can result in disability, cancer, and death.  You should be screened for sexually transmitted illnesses (STIs) including gonorrhea and chlamydia if:  You are sexually active and are younger than 24 years.  You are older than 24 years and your health care provider tells you that you are  at risk for this type of infection.  Your sexual activity has changed since you were last screened and you are at an increased risk for chlamydia or gonorrhea. Ask your health care provider if you are at risk.  If you are at risk of being infected with HIV, it is recommended that you take a prescription medicine daily to prevent HIV infection. This is called preexposure prophylaxis (PrEP). You are considered at risk if:  You are sexually active and do not regularly use condoms or know the HIV status of your partner(s).  You take drugs by injection.  You are sexually active with a partner who has HIV.  Talk with your health care provider  about whether you are at high risk of being infected with HIV. If you choose to begin PrEP, you should first be tested for HIV. You should then be tested every 3 months for as long as you are taking PrEP.  Osteoporosis is a disease in which the bones lose minerals and strength with aging. This can result in serious bone fractures or breaks. The risk of osteoporosis can be identified using a bone density scan. Women ages 53 years and over and women at risk for fractures or osteoporosis should discuss screening with their health care providers. Ask your health care provider whether you should take a calcium supplement or vitamin D to reduce the rate of osteoporosis.  Menopause can be associated with physical symptoms and risks. Hormone replacement therapy is available to decrease symptoms and risks. You should talk to your health care provider about whether hormone replacement therapy is right for you.  Use sunscreen. Apply sunscreen liberally and repeatedly throughout the day. You should seek shade when your shadow is shorter than you. Protect yourself by wearing long sleeves, pants, a wide-brimmed hat, and sunglasses year round, whenever you are outdoors.  Once a month, do a whole body skin exam, using a mirror to look at the skin on your back. Tell your health  care provider of new moles, moles that have irregular borders, moles that are larger than a pencil eraser, or moles that have changed in shape or color.  Stay current with required vaccines (immunizations).  Influenza vaccine. All adults should be immunized every year.  Tetanus, diphtheria, and acellular pertussis (Td, Tdap) vaccine. Pregnant women should receive 1 dose of Tdap vaccine during each pregnancy. The dose should be obtained regardless of the length of time since the last dose. Immunization is preferred during the 27th-36th week of gestation. An adult who has not previously received Tdap or who does not know her vaccine status should receive 1 dose of Tdap. This initial dose should be followed by tetanus and diphtheria toxoids (Td) booster doses every 10 years. Adults with an unknown or incomplete history of completing a 3-dose immunization series with Td-containing vaccines should begin or complete a primary immunization series including a Tdap dose. Adults should receive a Td booster every 10 years.  Varicella vaccine. An adult without evidence of immunity to varicella should receive 2 doses or a second dose if she has previously received 1 dose. Pregnant females who do not have evidence of immunity should receive the first dose after pregnancy. This first dose should be obtained before leaving the health care facility. The second dose should be obtained 4-8 weeks after the first dose.  Human papillomavirus (HPV) vaccine. Females aged 13-26 years who have not received the vaccine previously should obtain the 3-dose series. The vaccine is not recommended for use in pregnant females. However, pregnancy testing is not needed before receiving a dose. If a female is found to be pregnant after receiving a dose, no treatment is needed. In that case, the remaining doses should be delayed until after the pregnancy. Immunization is recommended for any person with an immunocompromised condition through  the age of 37 years if she did not get any or all doses earlier. During the 3-dose series, the second dose should be obtained 4-8 weeks after the first dose. The third dose should be obtained 24 weeks after the first dose and 16 weeks after the second dose.  Zoster vaccine. One dose is recommended for adults aged 62 years or older unless  certain conditions are present.  Measles, mumps, and rubella (MMR) vaccine. Adults born before [birth year] generally are considered immune to measles and mumps. Adults born in [birth year] or later should have 1 or more doses of MMR vaccine unless there is a contraindication to the vaccine or there is laboratory evidence of immunity to each of the three diseases. A routine second dose of MMR vaccine should be obtained at least 28 days after the first dose for students attending postsecondary schools, health care workers, or international travelers. People who received inactivated measles vaccine or an unknown type of measles vaccine during 1963-1967 should receive 2 doses of MMR vaccine. People who received inactivated mumps vaccine or an unknown type of mumps vaccine before 1979 and are at high risk for mumps infection should consider immunization with 2 doses of MMR vaccine. For females of childbearing age, rubella immunity should be determined. If there is no evidence of immunity, females who are not pregnant should be vaccinated. If there is no evidence of immunity, females who are pregnant should delay immunization until after pregnancy. Unvaccinated health care workers born before [birth year] who lack laboratory evidence of measles, mumps, or rubella immunity or laboratory confirmation of disease should consider measles and mumps immunization with 2 doses of MMR vaccine or rubella immunization with 1 dose of MMR vaccine.  Pneumococcal 13-valent conjugate (PCV13) vaccine. When indicated, a person who is uncertain of his immunization history and has no record of immunization should receive the  PCV13 vaccine. All adults 49 years of age and older should receive this vaccine. An adult aged 81 years or older who has certain medical conditions and has not been previously immunized should receive 1 dose of PCV13 vaccine. This PCV13 should be followed with a dose of pneumococcal polysaccharide (PPSV23) vaccine. Adults who are at high risk for pneumococcal disease should obtain the PPSV23 vaccine at least 8 weeks after the dose of PCV13 vaccine. Adults older than 66 years of age who have normal immune system function should obtain the PPSV23 vaccine dose at least 1 year after the dose of PCV13 vaccine.  Pneumococcal polysaccharide (PPSV23) vaccine. When PCV13 is also indicated, PCV13 should be obtained first. All adults aged 63 years and older should be immunized. An adult younger than age 5 years who has certain medical conditions should be immunized. Any person who resides in a nursing home or long-term care facility should be immunized. An adult smoker should be immunized. People with an immunocompromised condition and certain other conditions should receive both PCV13 and PPSV23 vaccines. People with human immunodeficiency virus (HIV) infection should be immunized as soon as possible after diagnosis. Immunization during chemotherapy or radiation therapy should be avoided. Routine use of PPSV23 vaccine is not recommended for American Indians, [place] Natives, or people younger than 65 years unless there are medical conditions that require PPSV23 vaccine. When indicated, people who have unknown immunization and have no record of immunization should receive PPSV23 vaccine. One-time revaccination 5 years after the first dose of PPSV23 is recommended for people aged 19-64 years who have chronic kidney failure, nephrotic syndrome, asplenia, or immunocompromised conditions. People who received 1-2 doses of PPSV23 before age 77 years should receive another dose of PPSV23 vaccine at age 45 years or later if at least  5 years have passed since the previous dose. Doses of PPSV23 are not needed for people immunized with PPSV23 at or after age 53 years.  Meningococcal vaccine. Adults with asplenia or persistent complement component deficiencies should receive  2 doses of quadrivalent meningococcal conjugate (MenACWY-D) vaccine. The doses should be obtained at least 2 months apart. Microbiologists working with certain meningococcal bacteria, [employer] recruits, people at risk during an outbreak, and people who travel to or live in countries with a high rate of meningitis should be immunized. A first-year college student up through age 47 years who is living in a residence hall should receive a dose if she did not receive a dose on or after her 16th birthday. Adults who have certain high-risk conditions should receive one or more doses of vaccine.  Hepatitis A vaccine. Adults who wish to be protected from this disease, have certain high-risk conditions, work with hepatitis A-infected animals, work in hepatitis A research labs, or travel to or work in countries with a high rate of hepatitis A should be immunized. Adults who were previously unvaccinated and who anticipate close contact with an international adoptee during the first 60 days after arrival in the [place] from a country with a high rate of hepatitis A should be immunized.  Hepatitis B vaccine. Adults who wish to be protected from this disease, have certain high-risk conditions, may be exposed to blood or other infectious body fluids, are household contacts or sex partners of hepatitis B positive people, are clients or workers in certain care facilities, or travel to or work in countries with a high rate of hepatitis B should be immunized.  Haemophilus influenzae type b (Hib) vaccine. A previously unvaccinated person with asplenia or sickle cell disease or having a scheduled splenectomy should receive 1 dose of Hib vaccine. Regardless of previous immunization, a  recipient of a hematopoietic stem cell transplant should receive a 3-dose series 6-12 months after her successful transplant. Hib vaccine is not recommended for adults with HIV infection. Preventive Services / Frequency Ages 62 to 34 years  Blood pressure check.** / Every 3-5 years.  Lipid and cholesterol check.** / Every 5 years beginning at age 70.  Clinical breast exam.** / Every 3 years for women in their 58s and 74s.  BRCA-related cancer risk assessment.** / For women who have family members with a BRCA-related cancer (breast, ovarian, tubal, or peritoneal cancers).  Pap test.** / Every 2 years from ages 82 through 86. Every 3 years starting at age 66 through age 15 or 70 with a history of 3 consecutive normal Pap tests.  HPV screening.** / Every 3 years from ages 21 through ages 14 to 18 with a history of 3 consecutive normal Pap tests.  Hepatitis C blood test.** / For any individual with known risks for hepatitis C.  Skin self-exam. / Monthly.  Influenza vaccine. / Every year.  Tetanus, diphtheria, and acellular pertussis (Tdap, Td) vaccine.** / Consult your health care provider. Pregnant women should receive 1 dose of Tdap vaccine during each pregnancy. 1 dose of Td every 10 years.  Varicella vaccine.** / Consult your health care provider. Pregnant females who do not have evidence of immunity should receive the first dose after pregnancy.  HPV vaccine. / 3 doses over 6 months, if 70 and younger. The vaccine is not recommended for use in pregnant females. However, pregnancy testing is not needed before receiving a dose.  Measles, mumps, rubella (MMR) vaccine.** / You need at least 1 dose of MMR if you were born in 1957 or later. You may also need a 2nd dose. For females of childbearing age, rubella immunity should be determined. If there is no evidence of immunity, females who are not pregnant  should be vaccinated. If there is no evidence of immunity, females who are pregnant  should delay immunization until after pregnancy.  Pneumococcal 13-valent conjugate (PCV13) vaccine.** / Consult your health care provider.  Pneumococcal polysaccharide (PPSV23) vaccine.** / 1 to 2 doses if you smoke cigarettes or if you have certain conditions.  Meningococcal vaccine.** / 1 dose if you are age 2 to 26 years and a Market researcher living in a residence hall, or have one of several medical conditions, you need to get vaccinated against meningococcal disease. You may also need additional booster doses.  Hepatitis A vaccine.** / Consult your health care provider.  Hepatitis B vaccine.** / Consult your health care provider.  Haemophilus influenzae type b (Hib) vaccine.** / Consult your health care provider. Ages 31 to 60 years  Blood pressure check.** / Every year.  Lipid and cholesterol check.** / Every 5 years beginning at age 70 years.  Lung cancer screening. / Every year if you are aged 40-80 years and have a 30-pack-year history of smoking and currently smoke or have quit within the past 15 years. Yearly screening is stopped once you have quit smoking for at least 15 years or develop a health problem that would prevent you from having lung cancer treatment.  Clinical breast exam.** / Every year after age 74 years.  BRCA-related cancer risk assessment.** / For women who have family members with a BRCA-related cancer (breast, ovarian, tubal, or peritoneal cancers).  Mammogram.** / Every year beginning at age 42 years and continuing for as long as you are in good health. Consult with your health care provider.  Pap test.** / Every 3 years starting at age 28 years through age 42 or 96 years with a history of 3 consecutive normal Pap tests.  HPV screening.** / Every 3 years from ages 57 years through ages 85 to 78 years with a history of 3 consecutive normal Pap tests.  Fecal occult blood test (FOBT) of stool. / Every year beginning at age 91 years and  continuing until age 45 years. You may not need to do this test if you get a colonoscopy every 10 years.  Flexible sigmoidoscopy or colonoscopy.** / Every 5 years for a flexible sigmoidoscopy or every 10 years for a colonoscopy beginning at age 74 years and continuing until age 6 years.  Hepatitis C blood test.** / For all people born from [birth year] through 1967 and any individual with known risks for hepatitis C.  Skin self-exam. / Monthly.  Influenza vaccine. / Every year.  Tetanus, diphtheria, and acellular pertussis (Tdap/Td) vaccine.** / Consult your health care provider. Pregnant women should receive 1 dose of Tdap vaccine during each pregnancy. 1 dose of Td every 10 years.  Varicella vaccine.** / Consult your health care provider. Pregnant females who do not have evidence of immunity should receive the first dose after pregnancy.  Zoster vaccine.** / 1 dose for adults aged 69 years or older.  Measles, mumps, rubella (MMR) vaccine.** / You need at least 1 dose of MMR if you were born in 1957 or later. You may also need a second dose. For females of childbearing age, rubella immunity should be determined. If there is no evidence of immunity, females who are not pregnant should be vaccinated. If there is no evidence of immunity, females who are pregnant should delay immunization until after pregnancy.  Pneumococcal 13-valent conjugate (PCV13) vaccine.** / Consult your health care provider.  Pneumococcal polysaccharide (PPSV23) vaccine.** / 1 to 2 doses if  you smoke cigarettes or if you have certain conditions.  Meningococcal vaccine.** / Consult your health care provider.  Hepatitis A vaccine.** / Consult your health care provider.  Hepatitis B vaccine.** / Consult your health care provider.  Haemophilus influenzae type b (Hib) vaccine.** / Consult your health care provider. Ages 35 years and over  Blood pressure check.** / Every year.  Lipid and cholesterol check.** / Every 5 years  beginning at age 66 years.  Lung cancer screening. / Every year if you are aged 11-80 years and have a 30-pack-year history of smoking and currently smoke or have quit within the past 15 years. Yearly screening is stopped once you have quit smoking for at least 15 years or develop a health problem that would prevent you from having lung cancer treatment.  Clinical breast exam.** / Every year after age 51 years.  BRCA-related cancer risk assessment.** / For women who have family members with a BRCA-related cancer (breast, ovarian, tubal, or peritoneal cancers).  Mammogram.** / Every year beginning at age 30 years and continuing for as long as you are in good health. Consult with your health care provider.  Pap test.** / Every 3 years starting at age 71 years through age 63 or 74 years with 3 consecutive normal Pap tests. Testing can be stopped between 65 and 70 years with 3 consecutive normal Pap tests and no abnormal Pap or HPV tests in the past 10 years.  HPV screening.** / Every 3 years from ages 53 years through ages 24 or 48 years with a history of 3 consecutive normal Pap tests. Testing can be stopped between 65 and 70 years with 3 consecutive normal Pap tests and no abnormal Pap or HPV tests in the past 10 years.  Fecal occult blood test (FOBT) of stool. / Every year beginning at age 54 years and continuing until age 36 years. You may not need to do this test if you get a colonoscopy every 10 years.  Flexible sigmoidoscopy or colonoscopy.** / Every 5 years for a flexible sigmoidoscopy or every 10 years for a colonoscopy beginning at age 27 years and continuing until age 66 years.  Hepatitis C blood test.** / For all people born from [birth year] through 1967 and any individual with known risks for hepatitis C.  Osteoporosis screening.** / A one-time screening for women ages 41 years and over and women at risk for fractures or osteoporosis.  Skin self-exam. / Monthly.  Influenza vaccine. /  Every year.  Tetanus, diphtheria, and acellular pertussis (Tdap/Td) vaccine.** / 1 dose of Td every 10 years.  Varicella vaccine.** / Consult your health care provider.  Zoster vaccine.** / 1 dose for adults aged 53 years or older.  Pneumococcal 13-valent conjugate (PCV13) vaccine.** / Consult your health care provider.  Pneumococcal polysaccharide (PPSV23) vaccine.** / 1 dose for all adults aged 45 years and older.  Meningococcal vaccine.** / Consult your health care provider.  Hepatitis A vaccine.** / Consult your health care provider.  Hepatitis B vaccine.** / Consult your health care provider.  Haemophilus influenzae type b (Hib) vaccine.** / Consult your health care provider. ** Family history and personal history of risk and conditions may change your health care provider's recommendations.   This information is not intended to replace advice given to you by your health care provider. Make sure you discuss any questions you have with your health care provider.   Document Released: 12/02/2001 Document Revised: 10/27/2014 Document Reviewed: 03/03/2011 Elsevier Interactive Patient Education 2016 Elsevier  Inc.

## 2015-09-04 NOTE — Assessment & Plan Note (Addendum)
Patient encouraged to maintain heart healthy diet, regular exercise, adequate sleep. Consider daily probiotics. Take medications as prescribed. Labs reviewed Allergies verified: Verified  Immunization Status: Reviewed with patient Prompted for insurance verification: Yes Flu vaccine-- Refuses  Tdap--Never had per patient PNA--Never had per patient Shingles--Never had per patient   Changes to FH, PSH or Personal Hx: Pap-- Will have during visit  MMG-- Had 06-2015 Normal Bone Density-- Completed LFT=0.788 CCS--Has not had per patient  Care Teams Updated: ED/Hospital/Urgent Care Visits: None per patient Prompted for: Updated insurance, contact information, forms: Will update upon arrival per patient Remind to bring: DPR information, advance directives: Yes  Labs reviewed

## 2015-09-10 LAB — CYTOLOGY - PAP

## 2015-09-11 ENCOUNTER — Other Ambulatory Visit: Payer: Self-pay | Admitting: Family Medicine

## 2015-09-11 MED ORDER — METRONIDAZOLE 500 MG PO TABS: 500.0000 mg | ORAL_TABLET | Freq: Two times a day (BID) | ORAL | Status: DC

## 2015-09-14 ENCOUNTER — Encounter: Payer: Self-pay | Admitting: Family Medicine

## 2015-09-14 DIAGNOSIS — M810 Age-related osteoporosis without current pathological fracture: Secondary | ICD-10-CM | POA: Insufficient documentation

## 2015-09-14 DIAGNOSIS — M8580 Other specified disorders of bone density and structure, unspecified site: Secondary | ICD-10-CM

## 2015-09-14 DIAGNOSIS — Z124 Encounter for screening for malignant neoplasm of cervix: Secondary | ICD-10-CM | POA: Insufficient documentation

## 2015-09-14 HISTORY — DX: Other specified disorders of bone density and structure, unspecified site: M85.80

## 2015-09-14 NOTE — Assessment & Plan Note (Signed)
Encouraged complete cessation. Discussed need to quit as relates to risk of numerous cancers, cardiac and pulmonary disease as well as neurologic complications. Counseled for greater than 3 minutes 

## 2015-09-14 NOTE — Assessment & Plan Note (Signed)
Tolerating statin, encouraged heart healthy diet, avoid trans fats, minimize simple carbs and saturated fats. Increase exercise as tolerated 

## 2015-09-14 NOTE — Assessment & Plan Note (Signed)

## 2015-09-14 NOTE — Assessment & Plan Note (Signed)
Well controlled, no changes to meds. Encouraged heart healthy diet such as the DASH diet and exercise as tolerated.  °

## 2015-09-14 NOTE — Assessment & Plan Note (Signed)
Patient offered Zostavax and declines, declines tdap and PNA vaccination as well

## 2015-09-14 NOTE — Assessment & Plan Note (Signed)
Pap today, no concerns on exam.  

## 2015-11-29 DIAGNOSIS — R69 Illness, unspecified: Secondary | ICD-10-CM | POA: Diagnosis not present

## 2016-01-03 ENCOUNTER — Other Ambulatory Visit (INDEPENDENT_AMBULATORY_CARE_PROVIDER_SITE_OTHER): Payer: Medicare HMO

## 2016-01-03 DIAGNOSIS — Z0000 Encounter for general adult medical examination without abnormal findings: Secondary | ICD-10-CM

## 2016-01-03 DIAGNOSIS — Z124 Encounter for screening for malignant neoplasm of cervix: Secondary | ICD-10-CM | POA: Diagnosis not present

## 2016-01-03 DIAGNOSIS — I10 Essential (primary) hypertension: Secondary | ICD-10-CM | POA: Diagnosis not present

## 2016-01-03 DIAGNOSIS — E782 Mixed hyperlipidemia: Secondary | ICD-10-CM

## 2016-01-03 LAB — COMPREHENSIVE METABOLIC PANEL
ALBUMIN: 4 g/dL (ref 3.5–5.2)
ALT: 13 U/L (ref 0–35)
AST: 17 U/L (ref 0–37)
Alkaline Phosphatase: 69 U/L (ref 39–117)
BILIRUBIN TOTAL: 0.5 mg/dL (ref 0.2–1.2)
BUN: 21 mg/dL (ref 6–23)
CALCIUM: 9.4 mg/dL (ref 8.4–10.5)
CHLORIDE: 105 meq/L (ref 96–112)
CO2: 28 mEq/L (ref 19–32)
CREATININE: 0.84 mg/dL (ref 0.40–1.20)
GFR: 71.98 mL/min (ref >60.00)
Glucose, Bld: 103 mg/dL — ABNORMAL HIGH (ref 70–99)
Potassium: 4.3 mEq/L (ref 3.5–5.1)
Sodium: 137 mEq/L (ref 135–145)
Total Protein: 7.2 g/dL (ref 6.0–8.3)

## 2016-01-03 LAB — CBC
HCT: 38.1 % (ref 36.0–46.0)
Hemoglobin: 12.9 g/dL (ref 12.0–15.0)
MCHC: 33.8 g/dL (ref 30.0–36.0)
MCV: 82.4 fl (ref 78.0–100.0)
Platelets: 156 10*3/uL (ref 150.0–400.0)
RBC: 4.62 Mil/uL (ref 3.87–5.11)
RDW: 13.2 % (ref 11.5–15.5)
WBC: 6.4 10*3/uL (ref 4.0–10.5)

## 2016-01-03 LAB — LIPID PANEL
CHOL/HDL RATIO: 4
CHOLESTEROL: 151 mg/dL (ref 0–200)
HDL: 38.8 mg/dL — AB (ref >39.00)
LDL Cholesterol: 98 mg/dL (ref 0–99)
NonHDL: 112.24
TRIGLYCERIDES: 73 mg/dL (ref 0.0–149.0)
VLDL: 14.6 mg/dL (ref 0.0–40.0)

## 2016-01-03 LAB — TSH: TSH: 1.1 u[IU]/mL (ref 0.35–4.50)

## 2016-01-10 ENCOUNTER — Encounter: Payer: Self-pay | Admitting: Family Medicine

## 2016-01-10 ENCOUNTER — Ambulatory Visit (INDEPENDENT_AMBULATORY_CARE_PROVIDER_SITE_OTHER): Payer: Medicare HMO | Admitting: Family Medicine

## 2016-01-10 VITALS — BP 120/84 | HR 69 | Temp 98.5°F | Ht 64.0 in | Wt 139.5 lb

## 2016-01-10 DIAGNOSIS — E782 Mixed hyperlipidemia: Secondary | ICD-10-CM | POA: Diagnosis not present

## 2016-01-10 DIAGNOSIS — M8580 Other specified disorders of bone density and structure, unspecified site: Secondary | ICD-10-CM | POA: Diagnosis not present

## 2016-01-10 DIAGNOSIS — I10 Essential (primary) hypertension: Secondary | ICD-10-CM

## 2016-01-10 DIAGNOSIS — Z720 Tobacco use: Secondary | ICD-10-CM | POA: Diagnosis not present

## 2016-01-10 DIAGNOSIS — Z0000 Encounter for general adult medical examination without abnormal findings: Secondary | ICD-10-CM

## 2016-01-10 MED ORDER — SIMVASTATIN 10 MG PO TABS: 10.0000 mg | ORAL_TABLET | Freq: Every day | ORAL | Status: DC

## 2016-01-10 NOTE — Progress Notes (Signed)
Pre visit review using our clinic review tool, if applicable. No additional management support is needed unless otherwise documented below in the visit note. 

## 2016-01-10 NOTE — Assessment & Plan Note (Signed)
Well controlled, no changes to meds. Encouraged heart healthy diet such as the DASH diet and exercise as tolerated.  °

## 2016-01-10 NOTE — Assessment & Plan Note (Signed)
Still considering doing her ACP documents, will bring them in if completes, was going to call to see if they cover cologuard but did not.

## 2016-01-10 NOTE — Assessment & Plan Note (Signed)
Encouraged to get adequate exercise, calcium and vitamin d intake 

## 2016-01-10 NOTE — Assessment & Plan Note (Addendum)
Encouraged complete cessation. Discussed need to quit as relates to risk of numerous cancers, cardiac and pulmonary disease as well as neurologic complications. Counseled for greater than 3 minutes. Decided to put effort into trying to quit, has cut down to 10-17 a day.

## 2016-01-10 NOTE — Progress Notes (Signed)
Patient ID: Cynthia LintsCheryl Hubbard, female   DOB: 1951-06-21, 65 y.o.   MRN: [id number]   Subjective:    Patient ID: Cynthia LintsCheryl Hubbard, female    DOB: 1951-06-21, 65 y.o.   MRN: [id number]  Chief Complaint  Patient presents with  . Follow-up    HPI Patient is in today for followup. She feels well today but is noting fatigue today since she did not sleep well last night. Has irregular sleep patterns and had to get up to come in here this morning. Denies CP/palp/SOB/HA/congestion/fevers/GI or GU c/o. Taking meds as prescribed  Past Medical History  Diagnosis Date  . Chicken pox 65 yrs old  . Measles 6 th grade    3 day measles  . Hyperlipidemia     weighed 30 pounds heavier- used to take crestor  . Hypertension     was 30 pounds heavier  . Other and unspecified hyperlipidemia 03/16/2014  . Retinal hemorrhage of right eye 03/16/2014  . Tobacco abuse disorder 03/16/2014  . HTN (hypertension) 03/16/2014  . History of chicken pox   . H/O measles     3 day measles   . Osteopenia 09/14/2015    Past Surgical History  Procedure Laterality Date  . Refractive surgery Right     Family History  Problem Relation Age of Onset  . Adopted: Yes    Social History   Social History  . Marital Status: Married    Spouse Name: N/A  . Number of Children: N/A  . Years of Education: N/A   Occupational History  . Not on file.   Social History Main Topics  . Smoking status: Current Every Day Smoker -- 0.75 packs/day for 40 years    Types: Cigarettes  . Smokeless tobacco: Never Used  . Alcohol Use: Yes     Comment: very seldom  . Drug Use: No  . Sexual Activity: Yes     Comment: lives with husband, no dietary restrictions, works part time at [employer]   Other Topics Concern  . Not on file   Social History Narrative    Outpatient Prescriptions Prior to Visit  Medication Sig Dispense Refill  . Cholecalciferol (VITAMIN D3) 5000 UNITS CAPS Take 1 capsule by mouth daily.    Marland Kitchen. lisinopril  (PRINIVIL,ZESTRIL) 5 MG tablet Take 1 tablet (5 mg total) by mouth 2 (two) times daily. 180 tablet 3  . OVER THE COUNTER MEDICATION Mega red daily    . Probiotic Product (PROBIOTIC DAILY PO) Take by mouth daily.    Marland Kitchen. BIOTIN PO Take by mouth daily.    . metroNIDAZOLE (FLAGYL) 500 MG tablet Take 1 tablet (500 mg total) by mouth 2 (two) times daily. Take for 5 days 10 tablet 0  . simvastatin (ZOCOR) 10 MG tablet Take 1 tablet (10 mg total) by mouth daily. (Patient not taking: Reported on 01/10/2016) 90 tablet 3   No facility-administered medications prior to visit.    No Known Allergies  Review of Systems  Constitutional: Negative for fever, weight loss and malaise/fatigue.  HENT: Negative for congestion.   Eyes: Negative for blurred vision.  Respiratory: Negative for shortness of breath.   Cardiovascular: Negative for chest pain, palpitations, claudication and leg swelling.  Gastrointestinal: Negative for nausea, abdominal pain and blood in stool.  Genitourinary: Negative for dysuria and frequency.  Musculoskeletal: Negative for falls.  Skin: Negative for itching and rash.  Neurological: Negative for dizziness, loss of consciousness and headaches.  Endo/Heme/Allergies: Negative for environmental allergies.  Psychiatric/Behavioral:  Negative for depression. The patient is not nervous/anxious.        Objective:    Physical Exam  Constitutional: She is oriented to person, place, and time. She appears well-developed and well-nourished. No distress.  HENT:  Head: Normocephalic and atraumatic.  Nose: Nose normal.  Eyes: Right eye exhibits no discharge. Left eye exhibits no discharge.  Neck: Normal range of motion. Neck supple.  Cardiovascular: Normal rate and regular rhythm.   No murmur heard. Pulmonary/Chest: Effort normal and breath sounds normal.  Abdominal: Soft. Bowel sounds are normal. There is no tenderness.  Musculoskeletal: She exhibits no edema.  Neurological: She is alert  and oriented to person, place, and time.  Skin: Skin is warm and dry.  Psychiatric: She has a normal mood and affect.  Nursing note and vitals reviewed.   BP 120/84 mmHg  Pulse 69  Temp(Src) 98.5 F (36.9 C) (Oral)  Ht  (1.626 m)  Wt 139 lb 8 oz (63.277 kg)  BMI 23.93 kg/m2  SpO2 97% Wt Readings from Last 3 Encounters:  01/10/16 139 lb 8 oz (63.277 kg)  09/04/15 131 lb 8 oz (59.648 kg)  06/21/15 130 lb 3.2 oz (59.058 kg)     Lab Results  Component Value Date   WBC 6.4 01/03/2016   HGB 12.9 01/03/2016   HCT 38.1 01/03/2016   PLT 156.0 01/03/2016   GLUCOSE 103* 01/03/2016   CHOL 151 01/03/2016   TRIG 73.0 01/03/2016   HDL 38.80* 01/03/2016   LDLCALC 98 01/03/2016   ALT 13 01/03/2016   AST 17 01/03/2016   NA 137 01/03/2016   K 4.3 01/03/2016   CL 105 01/03/2016   CREATININE 0.84 01/03/2016   BUN 21 01/03/2016   CO2 28 01/03/2016   TSH 1.10 01/03/2016    Lab Results  Component Value Date   TSH 1.10 01/03/2016   Lab Results  Component Value Date   WBC 6.4 01/03/2016   HGB 12.9 01/03/2016   HCT 38.1 01/03/2016   MCV 82.4 01/03/2016   PLT 156.0 01/03/2016   Lab Results  Component Value Date   NA 137 01/03/2016   K 4.3 01/03/2016   CO2 28 01/03/2016   GLUCOSE 103* 01/03/2016   BUN 21 01/03/2016   CREATININE 0.84 01/03/2016   BILITOT 0.5 01/03/2016   ALKPHOS 69 01/03/2016   AST 17 01/03/2016   ALT 13 01/03/2016   PROT 7.2 01/03/2016   ALBUMIN 4.0 01/03/2016   CALCIUM 9.4 01/03/2016   GFR 71.98 01/03/2016   Lab Results  Component Value Date   CHOL 151 01/03/2016   Lab Results  Component Value Date   HDL 38.80* 01/03/2016   Lab Results  Component Value Date   LDLCALC 98 01/03/2016   Lab Results  Component Value Date   TRIG 73.0 01/03/2016   Lab Results  Component Value Date   CHOLHDL 4 01/03/2016   No results found for: HGBA1C     Assessment & Plan:   Problem List Items Addressed This Visit    HTN (hypertension) - Primary     Well controlled, no changes to meds. Encouraged heart healthy diet such as the DASH diet and exercise as tolerated.       Relevant Medications   simvastatin (ZOCOR) 10 MG tablet   Hyperlipidemia, mixed    Tolerating statin, encouraged heart healthy diet, avoid trans fats, minimize simple carbs and saturated fats. Increase exercise as tolerated      Relevant Medications   simvastatin (ZOCOR)  10 MG tablet   Osteopenia    Encouraged to get adequate exercise, calcium and vitamin d intake      Preventative health care    Still considering doing her ACP documents, will bring them in if completes, was going to call to see if they cover cologuard but did not.       Tobacco abuse disorder    Encouraged complete cessation. Discussed need to quit as relates to risk of numerous cancers, cardiac and pulmonary disease as well as neurologic complications. Counseled for greater than 3 minutes. Decided to put effort into trying to quit, has cut down to 10-17 a day.          I have discontinued Ms. Starace BIOTIN PO and metroNIDAZOLE. I am also having her maintain her OVER THE COUNTER MEDICATION, Vitamin D3, lisinopril, Probiotic Product (PROBIOTIC DAILY PO), and simvastatin.  Meds ordered this encounter  Medications  . simvastatin (ZOCOR) 10 MG tablet    Sig: Take 1 tablet (10 mg total) by mouth daily.    Dispense:  90 tablet    Refill:  3     Danise Edge, MD

## 2016-01-10 NOTE — Patient Instructions (Addendum)
Call insurance coverage of Cologuard and coverage to check Hep C testing for preventative purposes as recommended by CDC Consider immunizations.   Tobacco Use Disorder Tobacco use disorder (TUD) is a mental disorder. It is the long-term use of tobacco in spite of related health problems or difficulty with normal life activities. Tobacco is most commonly smoked as cigarettes and less commonly as cigars or pipes. Smokeless chewing tobacco and snuff are also popular. People with TUD get a feeling of extreme pleasure (euphoria) from using tobacco and have a desire to use it again and again. Repeated use of tobacco can cause problems. The addictive effects of tobacco are due mainly tothe ingredient nicotine. Nicotine also causes a rush of adrenaline (epinephrine) in the body. This leads to increased blood pressure, heart rate, and breathing rate. These changes may cause problems for people with high blood pressure, weak hearts, or lung disease. High doses of nicotine in children and pets can lead to seizures and death.  Tobacco contains a number of other unsafe chemicals. These chemicals are espe cially harmful when inhaled as smoke and can damage almost every organ in the body. Smokers live shorter lives than nonsmokers and are at risk of dying from a number of diseases and cancers. Tobacco smoke can also cause health problems for nonsmokers (due to inhaling secondhand smoke). Smoking is also a fire hazard.  TUD usually starts in the late teenage years and is most common in young adults between the ages of 4 and 25 years. People who start smoking earlier in life are more likely to continue smoking as adults. TUD is somewhat more common in men than women. People with TUD are at higher risk for using alcohol and other drugs of abuse. RISK FACTORS Risk factors for TUD include:   Having family members with the disorder.  Being around people who use tobacco.  Having an existing mental health issue such as  schizophrenia, depression, bipolar disorder, ADHD, or posttraumatic stress disorder (PTSD). SIGNS AND SYMPTOMS  People with tobacco use disorder have two or more of the following signs and symptoms within 12 months:   Use of more tobacco over a longer period than intended.   Not able to cut down or control tobacco use.   A lot of time spent obtaining or using tobacco.   Strong desire or urge to use tobacco (craving). Cravings may last for 6 months or longer after quitting.  Use of tobacco even when use leads to major problems at work, school, or home.   Use of tobacco even when use leads to relationship problems.   Giving up or cutting down on important life activities because of tobacco use.   Repeatedly using tobacco in situations where it puts you or others in physical danger, like smoking in bed.   Use of tobacco even when it is known that a physical or mental problem is likely related to tobacco use.   Physical problems are numerous and may include chronic bronchitis, emphysema, lung and other cancers, gum disease, high blood pressure, heart disease, and stroke.   Mental problems caused by tobacco may include difficulty sleeping and anxiety.  Need to use greater amounts of tobacco to get the same effect. This means you have developed a tolerance.   Withdrawal symptoms as a result of stopping or rapidly cutting back use. These symptoms may last a month or more after quitting and include the following:   Depressed, anxious, or irritable mood.   Difficulty concentrating.  Increased appetite.  Restlessness or trouble sleeping.   Use of tobacco to avoid withdrawal symptoms. DIAGNOSIS  Tobacco use disorder is diagnosed by your health care provider. A diagnosis may be made by:  Your health care provider asking questions about your tobacco use and any problems it may be causing.  A physical exam.  Lab tests.  You may be referred to a mental health  professional or addiction specialist. The severity of tobacco use disorder depends on the number of signs and symptoms you have:   Mild--Two or three symptoms.  Moderate--Four or five symptoms.   Severe--Six or more symptoms.  TREATMENT  Many people with tobacco use disorder are unable to quit on their own and need help. Treatment options include the following:  Nicotine replacement therapy (NRT). NRT provides nicotine without the other harmful chemicals in tobacco. NRT gradually lowers the dosage of nicotine in the body and reduces withdrawal symptoms. NRT is available in over-the-counter forms (gum, lozenges, and skin patches) as well as prescription forms (mouth inhaler and nasal spray).  Medicines.This may include:  Antidepressant medicine that may reduce nicotine cravings.  A medicine that acts on nicotine receptors in the brain to reduce cravings and withdrawal symptoms. It may also block the effects of tobacco in people with TUD who relapse.  Counseling or talk therapy. A form of talk therapy called behavioral therapy is commonly used to treat people with TUD. Behavioral therapy looks at triggers for tobacco use, how to avoid them, and how to cope with cravings. It is most effective in person or by phone but is also available in self-help forms (books and Internet websites).  Support groups. These provide emotional support, advice, and guidance for quitting tobacco. The most effective treatment for TUD is usually a combination of medicine, talk therapy, and support groups. HOME CARE INSTRUCTIONS  Keep all follow-up visits as directed by your health care provider. This is important.  Take medicines only as directed by your health care provider.  Check with your health care provider before starting new prescription or over-the-counter medicines. SEEK MEDICAL CARE IF:  You are not able to take your medicines as prescribed.  Treatment is not helping your TUD and your symptoms  get worse. SEEK IMMEDIATE MEDICAL CARE IF:  You have serious thoughts about hurting yourself or others.  You have trouble breathing, chest pain, sudden weakness, or sudden numbness in part of your body.   This information is not intended to replace advice given to you by your health care provider. Make sure you discuss any questions you have with your health care provider.   Document Released: 06/11/2004 Document Revised: 10/27/2014 Document Reviewed: 12/02/2013 Elsevier Interactive Patient Education [employer]2016 Else[employer] Inc.

## 2016-01-10 NOTE — Assessment & Plan Note (Signed)
Tolerating statin, encouraged heart healthy diet, avoid trans fats, minimize simple carbs and saturated fats. Increase exercise as tolerated 

## 2016-03-11 ENCOUNTER — Other Ambulatory Visit: Payer: Self-pay | Admitting: Family Medicine

## 2016-05-20 DIAGNOSIS — I10 Essential (primary) hypertension: Secondary | ICD-10-CM | POA: Diagnosis not present

## 2016-05-20 DIAGNOSIS — J209 Acute bronchitis, unspecified: Secondary | ICD-10-CM | POA: Diagnosis not present

## 2016-05-20 DIAGNOSIS — J0100 Acute maxillary sinusitis, unspecified: Secondary | ICD-10-CM | POA: Diagnosis not present

## 2016-06-10 ENCOUNTER — Other Ambulatory Visit: Payer: Self-pay | Admitting: Family Medicine

## 2016-07-07 DIAGNOSIS — Z1231 Encounter for screening mammogram for malignant neoplasm of breast: Secondary | ICD-10-CM | POA: Diagnosis not present

## 2016-07-07 LAB — HM MAMMOGRAPHY

## 2016-07-09 ENCOUNTER — Encounter: Payer: Self-pay | Admitting: Family Medicine

## 2016-09-04 ENCOUNTER — Other Ambulatory Visit (INDEPENDENT_AMBULATORY_CARE_PROVIDER_SITE_OTHER): Payer: Medicare HMO

## 2016-09-04 DIAGNOSIS — Z720 Tobacco use: Secondary | ICD-10-CM

## 2016-09-04 DIAGNOSIS — Z0000 Encounter for general adult medical examination without abnormal findings: Secondary | ICD-10-CM

## 2016-09-04 DIAGNOSIS — M8580 Other specified disorders of bone density and structure, unspecified site: Secondary | ICD-10-CM | POA: Diagnosis not present

## 2016-09-04 DIAGNOSIS — I10 Essential (primary) hypertension: Secondary | ICD-10-CM

## 2016-09-04 DIAGNOSIS — E782 Mixed hyperlipidemia: Secondary | ICD-10-CM | POA: Diagnosis not present

## 2016-09-04 LAB — LIPID PANEL
CHOLESTEROL: 158 mg/dL (ref 0–200)
HDL: 38.4 mg/dL — ABNORMAL LOW (ref >39.00)
LDL Cholesterol: 100 mg/dL — ABNORMAL HIGH (ref 0–99)
NONHDL: 119.54
Total CHOL/HDL Ratio: 4
Triglycerides: 100 mg/dL (ref 0.0–149.0)
VLDL: 20 mg/dL (ref 0.0–40.0)

## 2016-09-04 LAB — COMPREHENSIVE METABOLIC PANEL
ALBUMIN: 4 g/dL (ref 3.5–5.2)
ALT: 14 U/L (ref 0–35)
AST: 18 U/L (ref 0–37)
Alkaline Phosphatase: 66 U/L (ref 39–117)
BILIRUBIN TOTAL: 0.4 mg/dL (ref 0.2–1.2)
BUN: 15 mg/dL (ref 6–23)
CO2: 29 mEq/L (ref 19–32)
Calcium: 9.3 mg/dL (ref 8.4–10.5)
Chloride: 105 mEq/L (ref 96–112)
Creatinine, Ser: 0.82 mg/dL (ref 0.40–1.20)
GFR: 73.86 mL/min (ref >60.00)
GLUCOSE: 87 mg/dL (ref 70–99)
Potassium: 4.3 mEq/L (ref 3.5–5.1)
Sodium: 139 mEq/L (ref 135–145)
TOTAL PROTEIN: 7 g/dL (ref 6.0–8.3)

## 2016-09-04 LAB — CBC
HCT: 38.5 % (ref 36.0–46.0)
HEMOGLOBIN: 13 g/dL (ref 12.0–15.0)
MCHC: 33.8 g/dL (ref 30.0–36.0)
MCV: 82.1 fl (ref 78.0–100.0)
PLATELETS: 162 10*3/uL (ref 150.0–400.0)
RBC: 4.69 Mil/uL (ref 3.87–5.11)
RDW: 13.4 % (ref 11.5–15.5)
WBC: 6 10*3/uL (ref 4.0–10.5)

## 2016-09-04 LAB — TSH: TSH: 0.78 u[IU]/mL (ref 0.35–4.50)

## 2016-09-09 ENCOUNTER — Encounter: Payer: Self-pay | Admitting: Family Medicine

## 2016-09-09 ENCOUNTER — Ambulatory Visit (INDEPENDENT_AMBULATORY_CARE_PROVIDER_SITE_OTHER): Payer: Medicare HMO | Admitting: Family Medicine

## 2016-09-09 VITALS — BP 132/92 | HR 63 | Temp 97.9°F | Ht 64.0 in | Wt 141.4 lb

## 2016-09-09 DIAGNOSIS — M8580 Other specified disorders of bone density and structure, unspecified site: Secondary | ICD-10-CM | POA: Diagnosis not present

## 2016-09-09 DIAGNOSIS — E782 Mixed hyperlipidemia: Secondary | ICD-10-CM | POA: Diagnosis not present

## 2016-09-09 DIAGNOSIS — I10 Essential (primary) hypertension: Secondary | ICD-10-CM

## 2016-09-09 DIAGNOSIS — Z720 Tobacco use: Secondary | ICD-10-CM | POA: Diagnosis not present

## 2016-09-09 DIAGNOSIS — Z0000 Encounter for general adult medical examination without abnormal findings: Secondary | ICD-10-CM | POA: Diagnosis not present

## 2016-09-09 NOTE — Assessment & Plan Note (Signed)
Encouraged to get adequate exercise, calcium and vitamin d intake 

## 2016-09-09 NOTE — Assessment & Plan Note (Signed)
Continues to smoke. Encouraged complete cessation. Discussed need to quit as relates to risk of numerous cancers, cardiac and pulmonary disease as well as neurologic complications. Counseled for greater than 3 minutes

## 2016-09-09 NOTE — Assessment & Plan Note (Signed)
Well controlled, no changes to meds. Encouraged heart healthy diet such as the DASH diet and exercise as tolerated.  °

## 2016-09-09 NOTE — Progress Notes (Signed)
Patient ID: Cynthia Hubbard, female   DOB: 1951-05-28, 65 y.o.   MRN: [id number]   Subjective:    Patient ID: Cynthia Hubbard, female    DOB: 08/07/1951, 65 y.o.   MRN: [id number]  Chief Complaint  Patient presents with  . Annual Exam    HPI Patient is in today for annual preventative exam and follow up on chronic medical conditions. Unfortunately continues to smoke and acknowledges occasional cough and PND. No recent acute illness or hospitalizations. Is doing well with ADLs at home acknowledges she is not maintaining a heart healthy diet each day. Exercises infrequently. Denies CP/palp/SOB/HA/congestion/fevers/GI or GU c/o. Taking meds as prescribed  Past Medical History:  Diagnosis Date  . Chicken pox 65 yrs old  . H/O measles    3 day measles   . History of chicken pox   . HTN (hypertension) 03/16/2014  . Hyperlipidemia    weighed 30 pounds heavier- used to take crestor  . Hypertension    was 30 pounds heavier  . Measles 6 th grade   3 day measles  . Osteopenia 09/14/2015  . Other and unspecified hyperlipidemia 03/16/2014  . Retinal hemorrhage of right eye 03/16/2014  . Tobacco abuse disorder 03/16/2014    Past Surgical History:  Procedure Laterality Date  . REFRACTIVE SURGERY Right     Family History  Problem Relation Age of Onset  . Adopted: Yes    Social History   Social History  . Marital status: Married    Spouse name: N/A  . Number of children: N/A  . Years of education: N/A   Occupational History  . Not on file.   Social History Main Topics  . Smoking status: Current Every Day Smoker    Packs/day: 0.75    Years: 40.00    Types: Cigarettes  . Smokeless tobacco: Never Used  . Alcohol use Yes     Comment: very seldom  . Drug use: No  . Sexual activity: Yes     Comment: lives with husband, no dietary restrictions, works part time at [employer]   Other Topics Concern  . Not on file   Social History Narrative  . No narrative on file     Outpatient Medications Prior to Visit  Medication Sig Dispense Refill  . Cholecalciferol (VITAMIN D3) 5000 UNITS CAPS Take 1 capsule by mouth daily.    Marland Kitchen lisinopril (PRINIVIL,ZESTRIL) 5 MG tablet TAKE 1 TABLET BY MOUTH TWICE DAILY 180 tablet 0  . OVER THE COUNTER MEDICATION Mega red daily    . Probiotic Product (PROBIOTIC DAILY PO) Take by mouth daily.    . simvastatin (ZOCOR) 10 MG tablet Take 1 tablet (10 mg total) by mouth daily. 90 tablet 3  . lisinopril (PRINIVIL,ZESTRIL) 5 MG tablet TAKE 1 TABLET BY MOUTH TWICE DAILY 180 tablet 0   No facility-administered medications prior to visit.     No Known Allergies  Review of Systems  Constitutional: Positive for malaise/fatigue. Negative for chills and fever.  HENT: Negative for congestion and hearing loss.   Eyes: Negative for discharge.  Respiratory: Positive for cough. Negative for sputum production and shortness of breath.   Cardiovascular: Negative for chest pain, palpitations and leg swelling.  Gastrointestinal: Negative for abdominal pain, blood in stool, constipation, diarrhea, heartburn, nausea and vomiting.  Genitourinary: Negative for dysuria, frequency, hematuria and urgency.  Musculoskeletal: Negative for back pain, falls and myalgias.  Skin: Negative for rash.  Neurological: Negative for dizziness, sensory change, loss of consciousness, weakness  and headaches.  Endo/Heme/Allergies: Negative for environmental allergies. Does not bruise/bleed easily.  Psychiatric/Behavioral: Negative for depression and suicidal ideas. The patient is not nervous/anxious and does not have insomnia.        Objective:    Physical Exam  Constitutional: She is oriented to person, place, and time. She appears well-developed and well-nourished. No distress.  HENT:  Head: Normocephalic and atraumatic.  Nose: Nose normal.  Eyes: Conjunctivae are normal. Right eye exhibits no discharge. Left eye exhibits no discharge.  Neck: Normal range of  motion. Neck supple. No thyromegaly present.  Cardiovascular: Normal rate, regular rhythm and normal heart sounds.   No murmur heard. Pulmonary/Chest: Effort normal and breath sounds normal. No respiratory distress.  Abdominal: Soft. Bowel sounds are normal. She exhibits no distension and no mass. There is no tenderness.  Musculoskeletal: She exhibits no edema.  Lymphadenopathy:    She has no cervical adenopathy.  Neurological: She is alert and oriented to person, place, and time.  Skin: Skin is warm and dry.  Psychiatric: She has a normal mood and affect. Her behavior is normal.  Nursing note and vitals reviewed.   BP (!) 132/92 (BP Location: Left Arm, Patient Position: Sitting, Cuff Size: Normal)   Pulse 63   Temp 97.9 F (36.6 C) (Oral)   Ht 5\' 4"  (1.626 m)   Wt 141 lb 6 oz (64.1 kg)   SpO2 97%   BMI 24.27 kg/m  Wt Readings from Last 3 Encounters:  09/09/16 141 lb 6 oz (64.1 kg)  01/10/16 139 lb 8 oz (63.3 kg)  09/04/15 131 lb 8 oz (59.6 kg)     Lab Results  Component Value Date   WBC 6.0 09/04/2016   HGB 13.0 09/04/2016   HCT 38.5 09/04/2016   PLT 162.0 09/04/2016   GLUCOSE 87 09/04/2016   CHOL 158 09/04/2016   TRIG 100.0 09/04/2016   HDL 38.40 (L) 09/04/2016   LDLCALC 100 (H) 09/04/2016   ALT 14 09/04/2016   AST 18 09/04/2016   NA 139 09/04/2016   K 4.3 09/04/2016   CL 105 09/04/2016   CREATININE 0.82 09/04/2016   BUN 15 09/04/2016   CO2 29 09/04/2016   TSH 0.78 09/04/2016    Lab Results  Component Value Date   TSH 0.78 09/04/2016   Lab Results  Component Value Date   WBC 6.0 09/04/2016   HGB 13.0 09/04/2016   HCT 38.5 09/04/2016   MCV 82.1 09/04/2016   PLT 162.0 09/04/2016   Lab Results  Component Value Date   NA 139 09/04/2016   K 4.3 09/04/2016   CO2 29 09/04/2016   GLUCOSE 87 09/04/2016   BUN 15 09/04/2016   CREATININE 0.82 09/04/2016   BILITOT 0.4 09/04/2016   ALKPHOS 66 09/04/2016   AST 18 09/04/2016   ALT 14 09/04/2016   PROT 7.0  09/04/2016   ALBUMIN 4.0 09/04/2016   CALCIUM 9.3 09/04/2016   GFR 73.86 09/04/2016   Lab Results  Component Value Date   CHOL 158 09/04/2016   Lab Results  Component Value Date   HDL 38.40 (L) 09/04/2016   Lab Results  Component Value Date   LDLCALC 100 (H) 09/04/2016   Lab Results  Component Value Date   TRIG 100.0 09/04/2016   Lab Results  Component Value Date   CHOLHDL 4 09/04/2016   No results found for: HGBA1C     Assessment & Plan:   Problem List Items Addressed This Visit    Hyperlipidemia, mixed  Encouraged heart healthy diet, increase exercise, avoid trans fats, consider a krill oil cap daily      HTN (hypertension)    Well controlled, no changes to meds. Encouraged heart healthy diet such as the DASH diet and exercise as tolerated.       Tobacco abuse disorder    Continues to smoke. Encouraged complete cessation. Discussed need to quit as relates to risk of numerous cancers, cardiac and pulmonary disease as well as neurologic complications. Counseled for greater than 3 minutes      Preventative health care    Patient encouraged to maintain heart healthy diet, regular exercise, adequate sleep. Consider daily probiotics. Take medications as prescribed. Is working on HCP and living will encouraged to proceed. Declines cologuard and colonoscopy today      Osteopenia    Encouraged to get adequate exercise, calcium and vitamin d intake         I am having [name] maintain her OVER THE COUNTER MEDICATION, Vitamin D3, Probiotic Product (PROBIOTIC DAILY PO), simvastatin, lisinopril, and b complex vitamins.  Meds ordered this encounter  Medications  . b complex vitamins tablet    Sig: Take 1 tablet by mouth daily.     Danise EdgeBLYTH, Angelus Hoopes, MD

## 2016-09-09 NOTE — Assessment & Plan Note (Addendum)
Patient encouraged to maintain heart healthy diet, regular exercise, adequate sleep. Consider daily probiotics. Take medications as prescribed. Is working on HCP and living will encouraged to proceed. Declines cologuard and colonoscopy today

## 2016-09-09 NOTE — Patient Instructions (Signed)
Elderberry liquid helps cough and to fight off viruses. Luckyvitamins. Com, [place] makes good supplements Preventive Care 67 Years and Older, Female Preventive care refers to lifestyle choices and visits with your health care provider that can promote health and wellness. What does preventive care include?  A yearly physical exam. This is also called an annual well check.  Dental exams once or twice a year.  Routine eye exams. Ask your health care provider how often you should have your eyes checked.  Personal lifestyle choices, including:  Daily care of your teeth and gums.  Regular physical activity.  Eating a healthy diet.  Avoiding tobacco and drug use.  Limiting alcohol use.  Practicing safe sex.  Taking low-dose aspirin every day.  Taking vitamin and mineral supplements as recommended by your health care provider. What happens during an annual well check? The services and screenings done by your health care provider during your annual well check will depend on your age, overall health, lifestyle risk factors, and family history of disease. Counseling  Your health care provider may ask you questions about your:  Alcohol use.  Tobacco use.  Drug use.  Emotional well-being.  Home and relationship well-being.  Sexual activity.  Eating habits.  History of falls.  Memory and ability to understand (cognition).  Work and work Statistician.  Reproductive health. Screening  You may have the following tests or measurements:  Height, weight, and BMI.  Blood pressure.  Lipid and cholesterol levels. These may be checked every 5 years, or more frequently if you are over 59 years old.  Skin check.  Lung cancer screening. You may have this screening every year starting at age 70 if you have a 30-pack-year history of smoking and currently smoke or have quit within the past 15 years.  Fecal occult blood test (FOBT) of the stool. You may have this test every  year starting at age 56.  Flexible sigmoidoscopy or colonoscopy. You may have a sigmoidoscopy every 5 years or a colonoscopy every 10 years starting at age 34.  Hepatitis C blood test.  Hepatitis B blood test.  Sexually transmitted disease (STD) testing.  Diabetes screening. This is done by checking your blood sugar (glucose) after you have not eaten for a while (fasting). You may have this done every 1-3 years.  Bone density scan. This is done to screen for osteoporosis. You may have this done starting at age 70.  Mammogram. This may be done every 1-2 years. Talk to your health care provider about how often you should have regular mammograms. Talk with your health care provider about your test results, treatment options, and if necessary, the need for more tests. Vaccines  Your health care provider may recommend certain vaccines, such as:  Influenza vaccine. This is recommended every year.  Tetanus, diphtheria, and acellular pertussis (Tdap, Td) vaccine. You may need a Td booster every 10 years.  Varicella vaccine. You may need this if you have not been vaccinated.  Zoster vaccine. You may need this after age 67.  Measles, mumps, and rubella (MMR) vaccine. You may need at least one dose of MMR if you were born in 1957 or later. You may also need a second dose.  Pneumococcal 13-valent conjugate (PCV13) vaccine. One dose is recommended after age 20.  Pneumococcal polysaccharide (PPSV23) vaccine. One dose is recommended after age 22.  Meningococcal vaccine. You may need this if you have certain conditions.  Hepatitis A vaccine. You may need this if you have  certain conditions or if you travel or work in places where you may be exposed to hepatitis A.  Hepatitis B vaccine. You may need this if you have certain conditions or if you travel or work in places where you may be exposed to hepatitis B.  Haemophilus influenzae type b (Hib) vaccine. You may need this if you have certain  conditions. Talk to your health care provider about which screenings and vaccines you need and how often you need them. This information is not intended to replace advice given to you by your health care provider. Make sure you discuss any questions you have with your health care provider. Document Released: 11/02/2015 Document Revised: 06/25/2016 Document Reviewed: 08/07/2015 Elsevier Interactive Patient Education  2017 [employer].

## 2016-09-09 NOTE — Progress Notes (Signed)
Pre visit review using our clinic review tool, if applicable. No additional management support is needed unless otherwise documented below in the visit note. 

## 2016-09-09 NOTE — Assessment & Plan Note (Signed)
Encouraged heart healthy diet, increase exercise, avoid trans fats, consider a krill oil cap daily 

## 2016-09-17 ENCOUNTER — Other Ambulatory Visit: Payer: Self-pay | Admitting: Family Medicine

## 2016-09-17 NOTE — Telephone Encounter (Signed)
Refill sent per [facility] refill protocol/SLS  

## 2016-12-22 ENCOUNTER — Other Ambulatory Visit: Payer: Self-pay | Admitting: Family Medicine

## 2016-12-22 MED ORDER — LISINOPRIL 5 MG PO TABS: 5.0000 mg | ORAL_TABLET | Freq: Two times a day (BID) | ORAL | 1 refills | Status: DC

## 2016-12-22 MED ORDER — SIMVASTATIN 10 MG PO TABS: 10.0000 mg | ORAL_TABLET | Freq: Every day | ORAL | 1 refills | Status: DC

## 2016-12-22 NOTE — Telephone Encounter (Signed)
Rxs sent

## 2016-12-22 NOTE — Telephone Encounter (Signed)
Caller name: Relationship to patient: Self Can be reached: [phone]  Pharmacy:  [id number] - [place], [place] - [address] [phone] (Phone) [phone] (Fax)     Reason for call: Refill lisinopril (PRINIVIL,ZESTRIL) 5 MG tablet   simvastatin (ZOCOR) 10 MG tablet

## 2017-02-02 DIAGNOSIS — R69 Illness, unspecified: Secondary | ICD-10-CM | POA: Diagnosis not present

## 2017-02-24 ENCOUNTER — Other Ambulatory Visit: Payer: Self-pay | Admitting: Family Medicine

## 2017-02-24 ENCOUNTER — Encounter: Payer: Self-pay | Admitting: Family Medicine

## 2017-02-24 DIAGNOSIS — E785 Hyperlipidemia, unspecified: Secondary | ICD-10-CM

## 2017-02-24 DIAGNOSIS — I10 Essential (primary) hypertension: Secondary | ICD-10-CM

## 2017-03-05 ENCOUNTER — Other Ambulatory Visit (INDEPENDENT_AMBULATORY_CARE_PROVIDER_SITE_OTHER): Payer: Medicare HMO

## 2017-03-05 DIAGNOSIS — I10 Essential (primary) hypertension: Secondary | ICD-10-CM | POA: Diagnosis not present

## 2017-03-05 DIAGNOSIS — E785 Hyperlipidemia, unspecified: Secondary | ICD-10-CM

## 2017-03-05 LAB — CBC
HEMATOCRIT: 38.9 % (ref 36.0–46.0)
HEMOGLOBIN: 13 g/dL (ref 12.0–15.0)
MCHC: 33.3 g/dL (ref 30.0–36.0)
MCV: 82.4 fl (ref 78.0–100.0)
Platelets: 167 10*3/uL (ref 150.0–400.0)
RBC: 4.72 Mil/uL (ref 3.87–5.11)
RDW: 13 % (ref 11.5–15.5)
WBC: 5.1 10*3/uL (ref 4.0–10.5)

## 2017-03-05 LAB — COMPREHENSIVE METABOLIC PANEL
ALT: 12 U/L (ref 0–35)
AST: 19 U/L (ref 0–37)
Albumin: 4 g/dL (ref 3.5–5.2)
Alkaline Phosphatase: 74 U/L (ref 39–117)
BUN: 17 mg/dL (ref 6–23)
CHLORIDE: 106 meq/L (ref 96–112)
CO2: 28 meq/L (ref 19–32)
CREATININE: 0.87 mg/dL (ref 0.40–1.20)
Calcium: 9.5 mg/dL (ref 8.4–10.5)
GFR: 68.88 mL/min (ref >60.00)
GLUCOSE: 100 mg/dL — AB (ref 70–99)
Potassium: 4.2 mEq/L (ref 3.5–5.1)
SODIUM: 138 meq/L (ref 135–145)
Total Bilirubin: 0.4 mg/dL (ref 0.2–1.2)
Total Protein: 7.2 g/dL (ref 6.0–8.3)

## 2017-03-05 LAB — TSH: TSH: 0.59 u[IU]/mL (ref 0.35–4.50)

## 2017-03-05 LAB — LIPID PANEL
Cholesterol: 142 mg/dL (ref 0–200)
HDL: 38.2 mg/dL — ABNORMAL LOW (ref >39.00)
LDL Cholesterol: 87 mg/dL (ref 0–99)
NONHDL: 103.9
Total CHOL/HDL Ratio: 4
Triglycerides: 84 mg/dL (ref 0.0–149.0)
VLDL: 16.8 mg/dL (ref 0.0–40.0)

## 2017-03-09 ENCOUNTER — Telehealth: Payer: Self-pay | Admitting: *Deleted

## 2017-03-09 NOTE — Telephone Encounter (Signed)
[facility]  03/10/17

## 2017-03-09 NOTE — Progress Notes (Signed)
Subjective:   Cynthia LintsCheryl Hubbard is a 66 y.o. female who presents for Medicare Annual (Subsequent) preventive examination.  Review of Systems:  No ROS.  Medicare Wellness Visit. Cardiac Risk Factors include: advanced age (>5555men, 38>65 women);dyslipidemia;hypertension Sleep patterns: Pt states no sleep schedule.Sleeps about 6 hrs per now. Feels rested.  Home Safety/Smoke Alarms:  Feels safe in home. Smoke alarms in place.  Living environment; residence and Firearm Safety: Lives with husband and dog. Guns safely stored. Seat Belt Safety/Bike Helmet: Wears seat belt.   Counseling:   Eye Exam- Wearing glasses. Goes to eye doctor as needed. Dental- Dentist every 6 months.  Female:   Pap-  Last 09/04/15: normal but missing some cells recommend repeat in 1-2 years. Will schedule today. Mammo- Last 07/07/16: BI-RADS Category 2: benign     Dexa scan-  Last 07/05/15:   osteopenia CCS- pt declines    Objective:     Vitals: BP 130/82 (BP Location: Right Arm, Patient Position: Sitting, Cuff Size: Normal)   Pulse 60   Ht 5\' 4"  (1.626 m)   Wt 141 lb 9.6 oz (64.2 kg)   SpO2 98%   BMI 24.31 kg/m   Body mass index is 24.31 kg/m.   Tobacco History  Smoking Status  . Current Every Day Smoker  . Packs/day: 0.75  . Years: 40.00  . Types: Cigarettes  Smokeless Tobacco  . Never Used     Ready to quit: No Counseling given: No   Past Medical History:  Diagnosis Date  . Chicken pox 66 yrs old  . H/O measles    3 day measles   . History of chicken pox   . HTN (hypertension) 03/16/2014  . Hyperlipidemia    weighed 30 pounds heavier- used to take crestor  . Hypertension    was 30 pounds heavier  . Measles 6 th grade   3 day measles  . Osteopenia 09/14/2015  . Other and unspecified hyperlipidemia 03/16/2014  . Retinal hemorrhage of right eye 03/16/2014  . Tobacco abuse disorder 03/16/2014   Past Surgical History:  Procedure Laterality Date  . REFRACTIVE SURGERY Right    Family History    Problem Relation Age of Onset  . Adopted: Yes   History  Sexual Activity  . Sexual activity: Yes    Comment: lives with husband, no dietary restrictions, works part time at a furniture store    Outpatient Encounter Prescriptions as of 03/10/2017  Medication Sig  . b complex vitamins tablet Take 1 tablet by mouth daily.  . Cholecalciferol (VITAMIN D3) 5000 UNITS CAPS Take 1 capsule by mouth daily.  Marland Kitchen. lisinopril (PRINIVIL,ZESTRIL) 5 MG tablet Take 1 tablet (5 mg total) by mouth 2 (two) times daily.  Marland Kitchen. OVER THE COUNTER MEDICATION Mega red daily  . Probiotic Product (PROBIOTIC DAILY PO) Take by mouth daily.  . simvastatin (ZOCOR) 10 MG tablet Take 1 tablet (10 mg total) by mouth daily.  . DENTA 5000 PLUS 1.1 % CREA dental cream USE AS DIRECTED TWO TIMES DAILY   No facility-administered encounter medications on file as of 03/10/2017.     Activities of Daily Living In your present state of health, do you have any difficulty performing the following activities: 03/10/2017 09/09/2016  Hearing? N N  Vision? N N  Difficulty concentrating or making decisions? N N  Walking or climbing stairs? N N  Dressing or bathing? 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 N -  Using the  Toilet? N -  In the past six months, have you accidently leaked urine? N -  Do you have problems with loss of bowel control? N -  Managing your Medications? N -  Managing your Finances? N -  Housekeeping or managing your Housekeeping? N -  Some recent data might be hidden    Patient Care Team: Bradd Canary, MD as PCP - General (Family Medicine)    Assessment:    Physical assessment deferred to PCP.  Exercise Activities and Dietary recommendations Current Exercise Habits: The patient does not participate in regular exercise at present, Exercise limited by: None identified   Diet (meal preparation, eat out, water intake, caffeinated beverages, dairy products, fruits and vegetables): in  general, an "unhealthy" diet   Goals    . Increase physical activity          Yoga and walking in park.        Fall Risk Fall Risk  09/09/2016 09/04/2015 06/21/2015 06/22/2014  Falls in the past year? No Yes Yes No  Number falls in past yr: - 1 1 -  Injury with Fall? - No No -  Follow up - Falls prevention discussed;Falls evaluation completed Education provided -   Depression Screen PHQ 2/9 Scores 09/09/2016 09/04/2015 06/21/2015 06/22/2014  PHQ - 2 Score 0 0 0 0     Cognitive Function Ad8 score reviewed for issues:  Issues making decisions:no  Less interest in hobbies / activities:no  Repeats questions, stories (family complaining):no  Trouble using ordinary gadgets (microwave, computer, phone):no  Forgets the month or year: no  Mismanaging finances: no  Remembering appts:no  Daily problems with thinking and/or memory:no Ad8 score is=0     MMSE - Mini Mental State Exam 06/21/2015  Orientation to time 5  Orientation to Place 5  Registration 3  Attention/ Calculation 5  Recall 3  Language- name 2 objects 2  Language- repeat 1  Language- follow 3 step command 3  Language- read & follow direction 1  Write a sentence 1  Copy design 1  Total score 30         There is no immunization history on file for this patient. Screening Tests Health Maintenance  Topic Date Due  . Hepatitis C Screening  Feb 12, 1951  . TETANUS/TDAP  06/12/1968  . COLONOSCOPY  06/13/1999  . PNA vac Low Risk Adult (1 of 2 - PCV13) 06/12/2014  . INFLUENZA VACCINE  10/12/2017 (Originally 05/20/2017)  . MAMMOGRAM  07/07/2018  . DEXA SCAN  Completed      Plan:     Follow up with PCP as scheduled today.  Eat heart healthy diet (full of fruits, vegetables, whole grains, lean protein, water--limit salt, fat, and sugar intake) and increase physical activity as tolerated.  Continue doing brain stimulating activities (puzzles, reading, adult coloring books, staying active) to keep memory sharp.    Consider stopping smoking.  Bring a copy of your advance directives to your next office visit.  I have personally reviewed and noted the following in the patient's chart:   . Medical and social history . Use of alcohol, tobacco or illicit drugs  . Current medications and supplements . Functional ability and status . Nutritional status . Physical activity . Advanced directives . List of other physicians . Hospitalizations, surgeries, and ER visits in previous 12 months . Vitals . Screenings to include cognitive, depression, and falls . Referrals and appointments  In addition, I have reviewed and discussed with patient certain preventive protocols,  quality metrics, and best practice recommendations. A written personalized care plan for preventive services as well as general preventive health recommendations were provided to patient.     Mady Haagensen [place], [place]  03/10/2017

## 2017-03-10 ENCOUNTER — Encounter: Payer: Self-pay | Admitting: Family Medicine

## 2017-03-10 ENCOUNTER — Ambulatory Visit (INDEPENDENT_AMBULATORY_CARE_PROVIDER_SITE_OTHER): Payer: Medicare HMO | Admitting: Family Medicine

## 2017-03-10 VITALS — BP 130/82 | HR 60 | Ht 64.0 in | Wt 141.6 lb

## 2017-03-10 DIAGNOSIS — I10 Essential (primary) hypertension: Secondary | ICD-10-CM | POA: Diagnosis not present

## 2017-03-10 DIAGNOSIS — M8580 Other specified disorders of bone density and structure, unspecified site: Secondary | ICD-10-CM

## 2017-03-10 DIAGNOSIS — E782 Mixed hyperlipidemia: Secondary | ICD-10-CM | POA: Diagnosis not present

## 2017-03-10 DIAGNOSIS — Z720 Tobacco use: Secondary | ICD-10-CM | POA: Diagnosis not present

## 2017-03-10 DIAGNOSIS — Z0000 Encounter for general adult medical examination without abnormal findings: Secondary | ICD-10-CM

## 2017-03-10 NOTE — Progress Notes (Signed)
Pre visit review using our clinic review tool, if applicable. No additional management support is needed unless otherwise documented below in the visit note. 

## 2017-03-10 NOTE — Progress Notes (Signed)
Patient ID: Cynthia Hubbard, female   DOB: [birth year], 66 y.o.   MRN: [id number]   Subjective:  I acted as a Neurosurgeon for Cynthia Edge, MD. [place], [place]   Patient ID: Cynthia Hubbard, female    DOB: 1950-12-17, 66 y.o.   MRN: [id number]  Chief Complaint  Patient presents with  . Medicare Wellness    with RN  . Hypertension    71-month F/U    Hypertension  This is a chronic problem. The problem is controlled. Pertinent negatives include no blurred vision, chest pain, headaches, malaise/fatigue, palpitations or shortness of breath.  Hyperlipidemia  This is a chronic problem. The problem is controlled. Pertinent negatives include no chest pain or shortness of breath.    Patient is in today for an Annual Wellness Visit with the Health Coach to be followed up the Provider. Patient wants to discuss Prevnar 13. Also states that she is due for a Pap and wants to know when she won't have to get Paps done again. Patient has a Hx of HTN, mixed hyperlipidemia, osteopenia. Patient has no additional concerns noted at this time. No recent febrile illness or recent hospitalization. She is still frustrated with her thin finger nails. Unfortunately she continues to struggle with smoking. Has ben trying to cut down but he has recently been creeping back up. Denies CP/palp/SOB/HA/congestion/fevers/GI or GU c/o. Taking meds as prescribed.   Patient Care Team: Bradd Canary, MD as PCP - General (Family Medicine)   Past Medical History:  Diagnosis Date  . Chicken pox 66 yrs old  . H/O measles    3 day measles   . History of chicken pox   . HTN (hypertension) 03/16/2014  . Hyperlipidemia    weighed 30 pounds heavier- used to take crestor  . Hypertension    was 30 pounds heavier  . Measles 6 th grade   3 day measles  . Osteopenia 09/14/2015  . Other and unspecified hyperlipidemia 03/16/2014  . Retinal hemorrhage of right eye 03/16/2014  . Tobacco abuse disorder 03/16/2014    Past Surgical History:    Procedure Laterality Date  . REFRACTIVE SURGERY Right     Family History  Problem Relation Age of Onset  . Adopted: Yes    Social History   Social History  . Marital status: Married    Spouse name: N/A  . Number of children: N/A  . Years of education: N/A   Occupational History  . Not on file.   Social History Main Topics  . Smoking status: Current Every Day Smoker    Packs/day: 0.75    Years: 40.00    Types: Cigarettes  . Smokeless tobacco: Never Used  . Alcohol use Yes     Comment: very seldom  . Drug use: No  . Sexual activity: Yes     Comment: lives with husband, no dietary restrictions, works part time at [employer]   Other Topics Concern  . Not on file   Social History Narrative  . No narrative on file    Outpatient Medications Prior to Visit  Medication Sig Dispense Refill  . b complex vitamins tablet Take 1 tablet by mouth daily.    . Cholecalciferol (VITAMIN D3) 5000 UNITS CAPS Take 1 capsule by mouth daily.    Marland Kitchen lisinopril (PRINIVIL,ZESTRIL) 5 MG tablet Take 1 tablet (5 mg total) by mouth 2 (two) times daily. 180 tablet 1  . OVER THE COUNTER MEDICATION Mega red daily    . Probiotic Product (  PROBIOTIC DAILY PO) Take by mouth daily.    . simvastatin (ZOCOR) 10 MG tablet Take 1 tablet (10 mg total) by mouth daily. 90 tablet 1   No facility-administered medications prior to visit.     No Known Allergies  Review of Systems  Constitutional: Negative for fever and malaise/fatigue.  HENT: Negative for congestion.   Eyes: Negative for blurred vision.  Respiratory: Negative for cough and shortness of breath.   Cardiovascular: Negative for chest pain, palpitations and leg swelling.  Gastrointestinal: Negative for vomiting.  Musculoskeletal: Negative for back pain.  Skin: Negative for rash.  Neurological: Negative for loss of consciousness and headaches.       Objective:    Physical Exam  Constitutional: She is oriented to person, place, and  time. She appears well-developed and well-nourished. No distress.  HENT:  Head: Normocephalic and atraumatic.  Eyes: Conjunctivae are normal.  Neck: Normal range of motion. No thyromegaly present.  Cardiovascular: Normal rate and regular rhythm.   Pulmonary/Chest: Effort normal and breath sounds normal. She has no wheezes.  Abdominal: Soft. Bowel sounds are normal. There is no tenderness.  Musculoskeletal: She exhibits no edema or deformity.  Neurological: She is alert and oriented to person, place, and time.  Skin: Skin is warm and dry. She is not diaphoretic.  Psychiatric: She has a normal mood and affect.    BP 130/82 (BP Location: Right Arm, Patient Position: Sitting, Cuff Size: Normal)   Pulse 60   Ht 5\' 4"  (1.626 m)   Wt 141 lb 9.6 oz (64.2 kg)   SpO2 98%   BMI 24.31 kg/m  Wt Readings from Last 3 Encounters:  03/10/17 141 lb 9.6 oz (64.2 kg)  09/09/16 141 lb 6 oz (64.1 kg)  01/10/16 139 lb 8 oz (63.3 kg)   BP Readings from Last 3 Encounters:  03/10/17 130/82  09/09/16 (!) 132/92  01/10/16 120/84      There is no immunization history on file for this patient.  Health Maintenance  Topic Date Due  . TETANUS/TDAP  06/10/2017 (Originally 06/12/1968)  . COLONOSCOPY  09/10/2017 (Originally 06/13/1999)  . Hepatitis C Screening  09/10/2017 (Originally Mar 27, 1951)  . PNA vac Low Risk Adult (1 of 2 - PCV13) 09/10/2017 (Originally 06/12/2014)  . INFLUENZA VACCINE  10/12/2017 (Originally 05/20/2017)  . MAMMOGRAM  07/07/2018  . DEXA SCAN  Completed    Lab Results  Component Value Date   WBC 5.1 03/05/2017   HGB 13.0 03/05/2017   HCT 38.9 03/05/2017   PLT 167.0 03/05/2017   GLUCOSE 100 (H) 03/05/2017   CHOL 142 03/05/2017   TRIG 84.0 03/05/2017   HDL 38.20 (L) 03/05/2017   LDLCALC 87 03/05/2017   ALT 12 03/05/2017   AST 19 03/05/2017   NA 138 03/05/2017   K 4.2 03/05/2017   CL 106 03/05/2017   CREATININE 0.87 03/05/2017   BUN 17 03/05/2017   CO2 28 03/05/2017   TSH  0.59 03/05/2017    Lab Results  Component Value Date   TSH 0.59 03/05/2017   Lab Results  Component Value Date   WBC 5.1 03/05/2017   HGB 13.0 03/05/2017   HCT 38.9 03/05/2017   MCV 82.4 03/05/2017   PLT 167.0 03/05/2017   Lab Results  Component Value Date   NA 138 03/05/2017   K 4.2 03/05/2017   CO2 28 03/05/2017   GLUCOSE 100 (H) 03/05/2017   BUN 17 03/05/2017   CREATININE 0.87 03/05/2017   BILITOT 0.4 03/05/2017  ALKPHOS 74 03/05/2017   AST 19 03/05/2017   ALT 12 03/05/2017   PROT 7.2 03/05/2017   ALBUMIN 4.0 03/05/2017   CALCIUM 9.5 03/05/2017   GFR 68.88 03/05/2017   Lab Results  Component Value Date   CHOL 142 03/05/2017   Lab Results  Component Value Date   HDL 38.20 (L) 03/05/2017   Lab Results  Component Value Date   LDLCALC 87 03/05/2017   Lab Results  Component Value Date   TRIG 84.0 03/05/2017   Lab Results  Component Value Date   CHOLHDL 4 03/05/2017   No results found for: HGBA1C       Assessment & Plan:   Problem List Items Addressed This Visit    Hyperlipidemia, mixed    Encouraged heart healthy diet, increase exercise, avoid trans fats, consider a krill oil cap daily      HTN (hypertension)    Well controlled, no changes to meds. Encouraged heart healthy diet such as the DASH diet and exercise as tolerated.       Tobacco abuse disorder    Encouraged complete cessation. Discussed need to quit as relates to risk of numerous cancers, cardiac and pulmonary disease as well as neurologic complications. Counseled for greater than 3 minutes      Osteopenia    Encouraged to get adequate exercise, calcium and vitamin d intake       Other Visit Diagnoses    Encounter for Medicare annual wellness exam    -  Primary      I am having Ms. Sinha maintain her OVER THE COUNTER MEDICATION, Vitamin D3, Probiotic Product (PROBIOTIC DAILY PO), b complex vitamins, simvastatin, lisinopril, and DENTA 5000 PLUS.  Meds ordered this encounter   Medications  . DENTA 5000 PLUS 1.1 % CREA dental cream    Sig: USE AS DIRECTED TWO TIMES DAILY    Refill:  3    CMA served as scribe during this visit. History, Physical and Plan performed by medical provider. Documentation and orders reviewed and attested to.  Cynthia Edge, MD

## 2017-03-10 NOTE — Assessment & Plan Note (Signed)
Encouraged complete cessation. Discussed need to quit as relates to risk of numerous cancers, cardiac and pulmonary disease as well as neurologic complications. Counseled for greater than 3 minutes 

## 2017-03-10 NOTE — Assessment & Plan Note (Signed)
Well controlled, no changes to meds. Encouraged heart healthy diet such as the DASH diet and exercise as tolerated.  °

## 2017-03-10 NOTE — Assessment & Plan Note (Signed)
Encouraged heart healthy diet, increase exercise, avoid trans fats, consider a krill oil cap daily 

## 2017-03-10 NOTE — Patient Instructions (Addendum)
Recommend calcium intake of 1200 to 1500 mg daily, divided into roughly 3 doses. Best source is the diet and a single dairy serving is about 500 mg, a supplement of calcium citrate once or twice daily to balance diet is fine if not getting enough in diet. Also need Vitamin D 2000 IU caps, 1 cap daily if not already taking vitamin D. Consider Citracal daily Cynthia Hubbard , Thank you for taking time to come for your Medicare Wellness Visit. I appreciate your ongoing commitment to your health goals. Please review the following plan we discussed and let me know if I can assist you in the future.   These are the goals we discussed: Goals      Patient Stated   . Eat healthier and lose 10lbs  (pt-stated)      Other   . Increase physical activity          Yoga and walking in park.      . Weight (lb) < 130 lb (59 kg)       This is a list of the screening recommended for you and due dates:  Health Maintenance  Topic Date Due  .  Hepatitis C: One time screening is recommended by Center for Disease Control  (CDC) for  adults born from [birth year] through 1967.   1951-04-17  . Tetanus Vaccine  06/12/1968  . Colon Cancer Screening  06/13/1999  . Pneumonia vaccines (1 of 2 - PCV13) 06/12/2014  . Flu Shot  10/12/2017*  . Mammogram  07/07/2018  . DEXA scan (bone density measurement)  Completed  *Topic was postponed. The date shown is not the original due date.    Health Maintenance for Postmenopausal Women Menopause is a normal process in which your reproductive ability comes to an end. This process happens gradually over a span of months to years, usually between the ages of 48 and 76. Menopause is complete when you have missed 12 consecutive menstrual periods. It is important to talk with your health care provider about some of the most common conditions that affect postmenopausal women, such as heart disease, cancer, and bone loss (osteoporosis). Adopting a healthy lifestyle and getting preventive care can  help to promote your health and wellness. Those actions can also lower your chances of developing some of these common conditions. What should I know about menopause? During menopause, you may experience a number of symptoms, such as:  Moderate-to-severe hot flashes.  Night sweats.  Decrease in sex drive.  Mood swings.  Headaches.  Tiredness.  Irritability.  Memory problems.  Insomnia. Choosing to treat or not to treat menopausal changes is an individual decision that you make with your health care provider. What should I know about hormone replacement therapy and supplements? Hormone therapy products are effective for treating symptoms that are associated with menopause, such as hot flashes and night sweats. Hormone replacement carries certain risks, especially as you become older. If you are thinking about using estrogen or estrogen with progestin treatments, discuss the benefits and risks with your health care provider. What should I know about heart disease and stroke? Heart disease, heart attack, and stroke become more likely as you age. This may be due, in part, to the hormonal changes that your body experiences during menopause. These can affect how your body processes dietary fats, triglycerides, and cholesterol. Heart attack and stroke are both medical emergencies. There are many things that you can do to help prevent heart disease and stroke:  Have your blood  pressure checked at least every 1-2 years. High blood pressure causes heart disease and increases the risk of stroke.  If you are 8-62 years old, ask your health care provider if you should take aspirin to prevent a heart attack or a stroke.  Do not use any tobacco products, including cigarettes, chewing tobacco, or electronic cigarettes. If you need help quitting, ask your health care provider.  It is important to eat a healthy diet and maintain a healthy weight.  Be sure to include plenty of vegetables, fruits,  low-fat dairy products, and lean protein.  Avoid eating foods that are high in solid fats, added sugars, or salt (sodium).  Get regular exercise. This is one of the most important things that you can do for your health.  Try to exercise for at least 150 minutes each week. The type of exercise that you do should increase your heart rate and make you sweat. This is known as moderate-intensity exercise.  Try to do strengthening exercises at least twice each week. Do these in addition to the moderate-intensity exercise.  Know your numbers.Ask your health care provider to check your cholesterol and your blood glucose. Continue to have your blood tested as directed by your health care provider. What should I know about cancer screening? There are several types of cancer. Take the following steps to reduce your risk and to catch any cancer development as early as possible. Breast Cancer  Practice breast self-awareness.  This means understanding how your breasts normally appear and feel.  It also means doing regular breast self-exams. Let your health care provider know about any changes, no matter how small.  If you are 84 or older, have a clinician do a breast exam (clinical breast exam or CBE) every year. Depending on your age, family history, and medical history, it may be recommended that you also have a yearly breast X-ray (mammogram).  If you have a family history of breast cancer, talk with your health care provider about genetic screening.  If you are at high risk for breast cancer, talk with your health care provider about having an MRI and a mammogram every year.  Breast cancer (BRCA) gene test is recommended for women who have family members with BRCA-related cancers. Results of the assessment will determine the need for genetic counseling and BRCA1 and for BRCA2 testing. BRCA-related cancers include these types:  Breast. This occurs in males or females.  Ovarian.  Tubal. This  may also be called fallopian tube cancer.  Cancer of the abdominal or pelvic lining (peritoneal cancer).  Prostate.  Pancreatic. Cervical, Uterine, and Ovarian Cancer  Your health care provider may recommend that you be screened regularly for cancer of the pelvic organs. These include your ovaries, uterus, and vagina. This screening involves a pelvic exam, which includes checking for microscopic changes to the surface of your cervix (Pap test).  For women ages 21-65, health care providers may recommend a pelvic exam and a Pap test every three years. For women ages 61-65, they may recommend the Pap test and pelvic exam, combined with testing for human papilloma virus (HPV), every five years. Some types of HPV increase your risk of cervical cancer. Testing for HPV may also be done on women of any age who have unclear Pap test results.  Other health care providers may not recommend any screening for nonpregnant women who are considered low risk for pelvic cancer and have no symptoms. Ask your health care provider if a screening pelvic exam  is right for you.  If you have had past treatment for cervical cancer or a condition that could lead to cancer, you need Pap tests and screening for cancer for at least 20 years after your treatment. If Pap tests have been discontinued for you, your risk factors (such as having a new sexual partner) need to be reassessed to determine if you should start having screenings again. Some women have medical problems that increase the chance of getting cervical cancer. In these cases, your health care provider may recommend that you have screening and Pap tests more often.  If you have a family history of uterine cancer or ovarian cancer, talk with your health care provider about genetic screening.  If you have vaginal bleeding after reaching menopause, tell your health care provider.  There are currently no reliable tests available to screen for ovarian cancer. Lung  Cancer  Lung cancer screening is recommended for adults 26-70 years old who are at high risk for lung cancer because of a history of smoking. A yearly low-dose CT scan of the lungs is recommended if you:  Currently smoke.  Have a history of at least 30 pack-years of smoking and you currently smoke or have quit within the past 15 years. A pack-year is smoking an average of one pack of cigarettes per day for one year. Yearly screening should:  Continue until it has been 15 years since you quit.  Stop if you develop a health problem that would prevent you from having lung cancer treatment. Colorectal Cancer  This type of cancer can be detected and can often be prevented.  Routine colorectal cancer screening usually begins at age 70 and continues through age 9.  If you have risk factors for colon cancer, your health care provider may recommend that you be screened at an earlier age.  If you have a family history of colorectal cancer, talk with your health care provider about genetic screening.  Your health care provider may also recommend using home test kits to check for hidden blood in your stool.  A small camera at the end of a tube can be used to examine your colon directly (sigmoidoscopy or colonoscopy). This is done to check for the earliest forms of colorectal cancer.  Direct examination of the colon should be repeated every 5-10 years until age 53. However, if early forms of precancerous polyps or small growths are found or if you have a family history or genetic risk for colorectal cancer, you may need to be screened more often. Skin Cancer  Check your skin from head to toe regularly.  Monitor any moles. Be sure to tell your health care provider:  About any new moles or changes in moles, especially if there is a change in a mole's shape or color.  If you have a mole that is larger than the size of a pencil eraser.  If any of your family members has a history of skin cancer,  especially at a young age, talk with your health care provider about genetic screening.  Always use sunscreen. Apply sunscreen liberally and repeatedly throughout the day.  Whenever you are outside, protect yourself by wearing long sleeves, pants, a wide-brimmed hat, and sunglasses. What should I know about osteoporosis? Osteoporosis is a condition in which bone destruction happens more quickly than new bone creation. After menopause, you may be at an increased risk for osteoporosis. To help prevent osteoporosis or the bone fractures that can happen because of osteoporosis, the following  is recommended:  If you are 59-40 years old, get at least 1,000 mg of calcium and at least 600 mg of vitamin D per day.  If you are older than age 33 but younger than age 69, get at least 1,200 mg of calcium and at least 600 mg of vitamin D per day.  If you are older than age 41, get at least 1,200 mg of calcium and at least 800 mg of vitamin D per day. Smoking and excessive alcohol intake increase the risk of osteoporosis. Eat foods that are rich in calcium and vitamin D, and do weight-bearing exercises several times each week as directed by your health care provider. What should I know about how menopause affects my mental health? Depression may occur at any age, but it is more common as you become older. Common symptoms of depression include:  Low or sad mood.  Changes in sleep patterns.  Changes in appetite or eating patterns.  Feeling an overall lack of motivation or enjoyment of activities that you previously enjoyed.  Frequent crying spells. Talk with your health care provider if you think that you are experiencing depression. What should I know about immunizations? It is important that you get and maintain your immunizations. These include:  Tetanus, diphtheria, and pertussis (Tdap) booster vaccine.  Influenza every year before the flu season begins.  Pneumonia vaccine.  Shingles  vaccine. Your health care provider may also recommend other immunizations. This information is not intended to replace advice given to you by your health care provider. Make sure you discuss any questions you have with your health care provider. Document Released: 11/28/2005 Document Revised: 04/25/2016 Document Reviewed: 07/10/2015 Elsevier Interactive Patient Education  2017 [place].   Follow up with PCP as scheduled today.  Eat heart healthy diet (full of fruits, vegetables, whole grains, lean protein, water--limit salt, fat, and sugar intake) and increase physical activity as tolerated.  Continue doing brain stimulating activities (puzzles, reading, adult coloring books, staying active) to keep memory sharp.   Consider stopping smoking.  Bring a copy of your advance directive Cholesterol Cholesterol is a white, waxy, fat-like substance that is needed by the human body in small amounts. The liver makes all the cholesterol we need. Cholesterol is carried from the liver by the blood through the blood vessels. Deposits of cholesterol (plaques) may build up on blood vessel (artery) walls. Plaques make the arteries narrower and stiffer. Cholesterol plaques increase the risk for heart attack and stroke. You cannot feel your cholesterol level even if it is very high. The only way to know that it is high is to have a blood test. Once you know your cholesterol levels, you should keep a record of the test results. Work with your health care provider to keep your levels in the desired range. What do the results mean?  Total cholesterol is a rough measure of all the cholesterol in your blood.  LDL (low-density lipoprotein) is the "bad" cholesterol. This is the type that causes plaque to build up on the artery walls. You want this level to be low.  HDL (high-density lipoprotein) is the "good" cholesterol because it cleans the arteries and carries the LDL away. You want this level to be  high.  Triglycerides are fat that the body can either burn for energy or store. High levels are closely linked to heart disease. What are the desired levels of cholesterol?  Total cholesterol below 200.  LDL below 100 for people who are at risk,  below 70 for people at very high risk.  HDL above 40 is good. A level of 60 or higher is considered to be protective against heart disease.  Triglycerides below 150. How can I lower my cholesterol? Diet  Follow your diet program as told by your health care provider.  Choose fish or white meat chicken and [place], roasted or baked. Limit fatty cuts of red meat, fried foods, and processed meats, such as sausage and lunch meats.  Eat lots of fresh fruits and vegetables.  Choose whole grains, beans, pasta, potatoes, and cereals.  Choose olive oil, corn oil, or canola oil, and use only small amounts.  Avoid butter, mayonnaise, shortening, or palm kernel oils.  Avoid foods with trans fats.  Drink skim or nonfat milk and eat low-fat or nonfat yogurt and cheeses. Avoid whole milk, cream, ice cream, egg yolks, and full-fat cheeses.  Healthier desserts include angel food cake, ginger snaps, animal crackers, hard candy, popsicles, and low-fat or nonfat frozen yogurt. Avoid pastries, cakes, pies, and cookies. Exercise  Follow your exercise program as told by your health care provider. A regular program:  Helps to decrease LDL and raise HDL.  Helps with weight control.  Do things that increase your activity level, such as gardening, walking, and taking the stairs.  Ask your health care provider about ways that you can be more active in your daily life. Medicine  Take over-the-counter and prescription medicines only as told by your health care provider.  Medicine may be prescribed by your health care provider to help lower cholesterol and decrease the risk for heart disease. This is usually done if diet and exercise have failed to bring down  cholesterol levels.  If you have several risk factors, you may need medicine even if your levels are normal. This information is not intended to replace advice given to you by your health care provider. Make sure you discuss any questions you have with your health care provider. Document Released: 07/01/2001 Document Revised: 05/03/2016 Document Reviewed: 04/05/2016 Elsevier Interactive Patient Education  2017 [employer]. s to your next office visit.

## 2017-03-10 NOTE — Assessment & Plan Note (Signed)
Encouraged to get adequate exercise, calcium and vitamin d intake 

## 2017-06-23 ENCOUNTER — Other Ambulatory Visit: Payer: Self-pay | Admitting: Family Medicine

## 2017-07-09 ENCOUNTER — Encounter: Payer: Self-pay | Admitting: Family Medicine

## 2017-07-10 ENCOUNTER — Other Ambulatory Visit: Payer: Self-pay | Admitting: Family Medicine

## 2017-07-31 ENCOUNTER — Encounter: Payer: Self-pay | Admitting: Family Medicine

## 2017-07-31 DIAGNOSIS — Z1231 Encounter for screening mammogram for malignant neoplasm of breast: Secondary | ICD-10-CM | POA: Diagnosis not present

## 2017-07-31 LAB — HM MAMMOGRAPHY

## 2017-08-06 ENCOUNTER — Encounter: Payer: Self-pay | Admitting: Family Medicine

## 2017-09-07 ENCOUNTER — Other Ambulatory Visit: Payer: Medicare HMO

## 2017-09-11 ENCOUNTER — Encounter: Payer: Medicare HMO | Admitting: Family Medicine

## 2017-09-29 DIAGNOSIS — R69 Illness, unspecified: Secondary | ICD-10-CM | POA: Diagnosis not present

## 2017-10-16 ENCOUNTER — Other Ambulatory Visit (INDEPENDENT_AMBULATORY_CARE_PROVIDER_SITE_OTHER): Payer: Medicare HMO

## 2017-10-16 DIAGNOSIS — I10 Essential (primary) hypertension: Secondary | ICD-10-CM | POA: Diagnosis not present

## 2017-10-16 DIAGNOSIS — Z0000 Encounter for general adult medical examination without abnormal findings: Secondary | ICD-10-CM

## 2017-10-16 DIAGNOSIS — E782 Mixed hyperlipidemia: Secondary | ICD-10-CM | POA: Diagnosis not present

## 2017-10-16 LAB — CBC
HCT: 40.8 % (ref 36.0–46.0)
Hemoglobin: 13.7 g/dL (ref 12.0–15.0)
MCHC: 33.5 g/dL (ref 30.0–36.0)
MCV: 84.2 fl (ref 78.0–100.0)
PLATELETS: 167 10*3/uL (ref 150.0–400.0)
RBC: 4.85 Mil/uL (ref 3.87–5.11)
RDW: 13 % (ref 11.5–15.5)
WBC: 5.4 10*3/uL (ref 4.0–10.5)

## 2017-10-16 LAB — COMPREHENSIVE METABOLIC PANEL
ALBUMIN: 4.2 g/dL (ref 3.5–5.2)
ALK PHOS: 64 U/L (ref 39–117)
ALT: 11 U/L (ref 0–35)
AST: 16 U/L (ref 0–37)
BILIRUBIN TOTAL: 0.6 mg/dL (ref 0.2–1.2)
BUN: 17 mg/dL (ref 6–23)
CALCIUM: 9.8 mg/dL (ref 8.4–10.5)
CO2: 30 meq/L (ref 19–32)
CREATININE: 0.9 mg/dL (ref 0.40–1.20)
Chloride: 106 mEq/L (ref 96–112)
GFR: 66.11 mL/min (ref >60.00)
Glucose, Bld: 95 mg/dL (ref 70–99)
Potassium: 5.4 mEq/L — ABNORMAL HIGH (ref 3.5–5.1)
Sodium: 141 mEq/L (ref 135–145)
Total Protein: 7.5 g/dL (ref 6.0–8.3)

## 2017-10-16 LAB — LIPID PANEL
CHOLESTEROL: 155 mg/dL (ref 0–200)
HDL: 36.9 mg/dL — AB (ref >39.00)
LDL Cholesterol: 97 mg/dL (ref 0–99)
NonHDL: 117.63
TRIGLYCERIDES: 104 mg/dL (ref 0.0–149.0)
Total CHOL/HDL Ratio: 4
VLDL: 20.8 mg/dL (ref 0.0–40.0)

## 2017-10-16 LAB — TSH: TSH: 1.03 u[IU]/mL (ref 0.35–4.50)

## 2017-10-23 ENCOUNTER — Telehealth: Payer: Self-pay | Admitting: *Deleted

## 2017-10-23 ENCOUNTER — Encounter: Payer: Self-pay | Admitting: Family Medicine

## 2017-10-23 ENCOUNTER — Other Ambulatory Visit: Payer: Self-pay | Admitting: Family Medicine

## 2017-10-23 ENCOUNTER — Ambulatory Visit (INDEPENDENT_AMBULATORY_CARE_PROVIDER_SITE_OTHER): Payer: Medicare HMO

## 2017-10-23 ENCOUNTER — Ambulatory Visit (INDEPENDENT_AMBULATORY_CARE_PROVIDER_SITE_OTHER): Payer: Medicare HMO | Admitting: Family Medicine

## 2017-10-23 ENCOUNTER — Other Ambulatory Visit: Payer: Medicare HMO

## 2017-10-23 VITALS — BP 134/90 | HR 65 | Temp 98.0°F | Resp 16 | Ht 63.78 in | Wt 141.2 lb

## 2017-10-23 DIAGNOSIS — Z0001 Encounter for general adult medical examination with abnormal findings: Secondary | ICD-10-CM

## 2017-10-23 DIAGNOSIS — I10 Essential (primary) hypertension: Secondary | ICD-10-CM

## 2017-10-23 DIAGNOSIS — Z720 Tobacco use: Secondary | ICD-10-CM | POA: Diagnosis not present

## 2017-10-23 DIAGNOSIS — J329 Chronic sinusitis, unspecified: Secondary | ICD-10-CM | POA: Insufficient documentation

## 2017-10-23 DIAGNOSIS — E875 Hyperkalemia: Secondary | ICD-10-CM

## 2017-10-23 DIAGNOSIS — J069 Acute upper respiratory infection, unspecified: Secondary | ICD-10-CM

## 2017-10-23 DIAGNOSIS — M8580 Other specified disorders of bone density and structure, unspecified site: Secondary | ICD-10-CM | POA: Diagnosis not present

## 2017-10-23 DIAGNOSIS — E782 Mixed hyperlipidemia: Secondary | ICD-10-CM | POA: Diagnosis not present

## 2017-10-23 DIAGNOSIS — Z0000 Encounter for general adult medical examination without abnormal findings: Secondary | ICD-10-CM

## 2017-10-23 DIAGNOSIS — B0189 Other varicella complications: Secondary | ICD-10-CM

## 2017-10-23 DIAGNOSIS — R251 Tremor, unspecified: Secondary | ICD-10-CM | POA: Diagnosis not present

## 2017-10-23 LAB — COMPREHENSIVE METABOLIC PANEL
AG RATIO: 1.6 (calc) (ref 1.0–2.5)
ALBUMIN MSPROF: 4.2 g/dL (ref 3.6–5.1)
ALBUMIN: 4.2 g/dL (ref 3.5–5.2)
ALKALINE PHOSPHATASE (APISO): 73 U/L (ref 33–130)
ALT: 11 U/L (ref 0–35)
ALT: 10 U/L (ref 6–29)
AST: 18 U/L (ref 0–37)
AST: 16 U/L (ref 10–35)
Alkaline Phosphatase: 72 U/L (ref 39–117)
BILIRUBIN TOTAL: 0.4 mg/dL (ref 0.2–1.2)
BUN: 17 mg/dL (ref 6–23)
BUN: 16 mg/dL (ref 7–25)
CALCIUM: 9.9 mg/dL (ref 8.4–10.5)
CALCIUM: 9.4 mg/dL (ref 8.6–10.4)
CHLORIDE: 106 meq/L (ref 96–112)
CO2: 30 meq/L (ref 19–32)
CO2: 30 mmol/L (ref 20–32)
Chloride: 104 mmol/L (ref 98–110)
Creat: 0.92 mg/dL (ref 0.50–0.99)
Creatinine, Ser: 0.89 mg/dL (ref 0.40–1.20)
GFR: 66.97 mL/min (ref >60.00)
Globulin: 2.7 g/dL (calc) (ref 1.9–3.7)
Glucose, Bld: 92 mg/dL (ref 70–99)
Glucose, Bld: 106 mg/dL — ABNORMAL HIGH (ref 65–99)
POTASSIUM: 6.2 meq/L — AB (ref 3.5–5.1)
POTASSIUM: 5.4 mmol/L — AB (ref 3.5–5.3)
SODIUM: 140 mmol/L (ref 135–146)
Sodium: 143 mEq/L (ref 135–145)
TOTAL PROTEIN: 6.9 g/dL (ref 6.1–8.1)
Total Bilirubin: 0.6 mg/dL (ref 0.2–1.2)
Total Protein: 7.5 g/dL (ref 6.0–8.3)

## 2017-10-23 MED ORDER — AMOXICILLIN 500 MG PO CAPS: 500.0000 mg | ORAL_CAPSULE | Freq: Three times a day (TID) | ORAL | 0 refills | Status: DC

## 2017-10-23 NOTE — Assessment & Plan Note (Signed)
Well controlled, no changes to meds. Encouraged heart healthy diet such as the DASH diet and exercise as tolerated.  °

## 2017-10-23 NOTE — Patient Instructions (Addendum)
Encouraged increased rest and hydration, add probiotics, zinc such as Coldeze or Xicam. Treat fevers as needed. Muinex/Guaifenasin twice daily Vitamin C 500 to 1034m, elderberry liquid or caps, aged or black garlic.  NOW probiotic 1 cap daily, or another multistrain one. 64 oz of clear fluids daily  Consider the new shingles shot, called Shingrix, 2 shots over 2-6 months  2 pneumonia shots called Prevnar and Pneumovax Tdap at pharmacy [id number], [place] Preventive care refers to lifestyle choices and visits with your health care provider that can promote health and wellness. What does preventive care include?  A yearly physical exam. This is also called an annual well check.  Dental exams once or twice a year.  Routine eye exams. Ask your health care provider how often you should have your eyes checked.  Personal lifestyle choices, including: ? Daily care of your teeth and gums. ? Regular physical activity. ? Eating a healthy diet. ? Avoiding tobacco and drug use. ? Limiting alcohol use. ? Practicing safe sex. ? Taking low-dose aspirin every day. ? Taking vitamin and mineral supplements as recommended by your health care provider. What happens during an annual well check? The services and screenings done by your health care provider during your annual well check will depend on your age, overall health, lifestyle risk factors, and family history of disease. Counseling Your health care provider may ask you questions about your:  Alcohol use.  Tobacco use.  Drug use.  Emotional well-being.  Home and relationship well-being.  Sexual activity.  Eating habits.  History of falls.  Memory and ability to understand (cognition).  Work and work eStatistician  Reproductive health.  Screening You may have the following tests or measurements:  Height, weight, and BMI.  Blood pressure.  Lipid and cholesterol levels.  These may be checked every 5 years, or more frequently if you are over 511years old.  Skin check.  Lung cancer screening. You may have this screening every year starting at age 3452if you have a 30-pack-year history of smoking and currently smoke or have quit within the past 15 years.  Fecal occult blood test (FOBT) of the stool. You may have this test every year starting at age 69  Flexible sigmoidoscopy or colonoscopy. You may have a sigmoidoscopy every 5 years or a colonoscopy every 10 years starting at age 69  Hepatitis C blood test.  Hepatitis B blood test.  Sexually transmitted disease (STD) testing.  Diabetes screening. This is done by checking your blood sugar (glucose) after you have not eaten for a while (fasting). You may have this done every 1-3 years.  Bone density scan. This is done to screen for osteoporosis. You may have this done starting at age 42592  Mammogram. This may be done every 1-2 years. Talk to your health care provider about how often you should have regular mammograms.  Talk with your health care provider about your test results, treatment options, and if necessary, the need for more tests. Vaccines Your health care provider may recommend certain vaccines, such as:  Influenza vaccine. This is recommended every year.  Tetanus, diphtheria, and acellular pertussis (Tdap, Td) vaccine. You may need a Td booster every 10 years.  Varicella vaccine. You may need this if you have not been vaccinated.  Zoster vaccine. You may need this after age 42553  Measles, mumps, and rubella (MMR) vaccine. You may need at least one dose of MMR  if you were born in 1957 or later. You may also need a second dose.  Pneumococcal 13-valent conjugate (PCV13) vaccine. One dose is recommended after age 47.  Pneumococcal polysaccharide (PPSV23) vaccine. One dose is recommended after age 63.  Meningococcal vaccine. You may need this if you have certain conditions.  Hepatitis A  vaccine. You may need this if you have certain conditions or if you travel or work in places where you may be exposed to hepatitis A.  Hepatitis B vaccine. You may need this if you have certain conditions or if you travel or work in places where you may be exposed to hepatitis B.  Haemophilus influenzae type b (Hib) vaccine. You may need this if you have certain conditions.  Talk to your health care provider about which screenings and vaccines you need and how often you need them. This information is not intended to replace advice given to you by your health care provider. Make sure you discuss any questions you have with your health care provider. Document Released: 11/02/2015 Document Revised: 06/25/2016 Document Reviewed: 08/07/2015 Elsevier Interactive Patient Education  [employer].

## 2017-10-23 NOTE — Telephone Encounter (Addendum)
CRITICAL VALUE STICKER  CRITICAL VALUE:Potassium:6.2  RECEIVER (on-site recipient of call):Angie  DATE & TIME NOTIFIED:10/23/17 @ 1:01pm  MESSENGER (representative from lab):Saa-Elam  MD NOTIFIED:Blyth-The results were reported [facility],CMA to report to Dr. Abner GreenspanBlyth.  TIME OF NOTIFICATION:Reported to [facility],CMA @ 1:04pm.  RESPONSE:Dr. Abner GreenspanBlyth called the patient.

## 2017-10-23 NOTE — Assessment & Plan Note (Signed)
Declines bone density today. Encouraged to get adequate exercise, calcium and vitamin d intake

## 2017-10-23 NOTE — Addendum Note (Signed)
Addended by: Verdie ShireBAYNES, ANGELA M on: 10/23/2017 03:48 PM   Modules accepted: Orders

## 2017-10-23 NOTE — Assessment & Plan Note (Addendum)
Recheck cmp today, asymptomatic but recheck higher. She agreed to return and repeat showed K of 5.4. EKG unremarkable. Will monitor and minimize potassium in diet.

## 2017-10-23 NOTE — Telephone Encounter (Signed)
She is on her way back in for a repeat stat CMP and an EKG

## 2017-10-23 NOTE — Assessment & Plan Note (Signed)
Worse with resting and in am. Consider beta blockers consider referral to neurology

## 2017-10-23 NOTE — Assessment & Plan Note (Signed)
Patient encouraged to maintain heart healthy diet, regular exercise, adequate sleep. Consider daily probiotics. Take medications as prescribed. Given and reviewed copy of ACP documents from  Secretary of State and encouraged to complete and return 

## 2017-10-23 NOTE — Progress Notes (Signed)
Subjective:  I acted as Hubbard Neurosurgeonscribe for Textron IncDr.Inanna Hubbard. Cynthia Hubbard, RMA   Patient ID: Cynthia LintsCheryl Hubbard, female    DOB: 1951-04-24, 67 y.o.   MRN: [id number]  Chief Complaint  Patient presents with  . Annual Exam    HPI  Patient is in today for annual exam and follow up on chronic medical concerns such as Hypertension, hyperlipidemia and tobacco use. She continues to smoke. Had Hubbard 24 stomach bug last month with one day of diarrhea, nausea and vomiting. This resolved but her appetite has remained suppressed some and her taste for carbs has increased. She started having respiratory symptoms this week. Her husband had Hubbard cold last week. She has started on some Coricidan HBP but only gets mild relief. She notes nasal congestion with PND and Hubbard cough and mild throat irritation. No fevers, chilss, chest congestion. She is otherwise doing well with ADLs. Is not eating as well and eating more carbohydrates. Denies CP/palp/SOB/HA/congestion/fevers/GI or GU c/o. Taking meds as prescribed. Is smoking 12 to 16 cigarettes Hubbard day  Patient Care Team: Cynthia Hubbard, Cynthia Gough A, MD as PCP - General (Family Medicine)   Past Medical History:  Diagnosis Date  . Chicken pox 67 yrs old  . H/O measles    3 day measles   . History of chicken pox   . HTN (hypertension) 03/16/2014  . Hyperlipidemia    weighed 30 pounds heavier- used to take crestor  . Hypertension    was 30 pounds heavier  . Measles 6 th grade   3 day measles  . Osteopenia 09/14/2015  . Other and unspecified hyperlipidemia 03/16/2014  . Retinal hemorrhage of right eye 03/16/2014  . Tobacco abuse disorder 03/16/2014    Past Surgical History:  Procedure Laterality Date  . REFRACTIVE SURGERY Right     Family History  Adopted: Yes    Social History   Socioeconomic History  . Marital status: Married    Spouse name: Not on file  . Number of children: Not on file  . Years of education: Not on file  . Highest education level: Not on file  Social Needs  .  Financial resource strain: Not on file  . Food insecurity - worry: Not on file  . Food insecurity - inability: Not on file  . Transportation needs - medical: Not on file  . Transportation needs - non-medical: Not on file  Occupational History  . Not on file  Tobacco Use  . Smoking status: Current Every Day Smoker    Packs/day: 0.75    Years: 40.00    Pack years: 30.00    Types: Cigarettes  . Smokeless tobacco: Never Used  Substance and Sexual Activity  . Alcohol use: Yes    Comment: very seldom  . Drug use: No  . Sexual activity: Yes    Comment: lives with husband, no dietary restrictions, works part time at [employer]  Other Topics Concern  . Not on file  Social History Narrative  . Not on file    Outpatient Medications Prior to Visit  Medication Sig Dispense Refill  . DENTA 5000 PLUS 1.1 % CREA dental cream USE AS DIRECTED TWO TIMES DAILY  3  . lisinopril (PRINIVIL,ZESTRIL) 5 MG tablet TAKE 1 TABLET BY MOUTH TWICE Hubbard DAY 180 tablet 1  . OVER THE COUNTER MEDICATION Mega red daily    . Probiotic Product (PROBIOTIC DAILY PO) Take by mouth daily.    . simvastatin (ZOCOR) 10 MG tablet TAKE 1 TABLET BY  MOUTH EVERY DAY 90 tablet 1  . b complex vitamins tablet Take 1 tablet by mouth daily.    . Cholecalciferol (VITAMIN D3) 5000 UNITS CAPS Take 1 capsule by mouth daily.     No facility-administered medications prior to visit.     No Known Allergies  Review of Systems  Constitutional: Positive for malaise/fatigue. Negative for chills and fever.  HENT: Positive for congestion and sore throat. Negative for hearing loss.   Eyes: Negative for discharge.  Respiratory: Positive for cough. Negative for sputum production and shortness of breath.   Cardiovascular: Negative for chest pain, palpitations and leg swelling.  Gastrointestinal: Positive for nausea. Negative for abdominal pain, blood in stool, constipation, diarrhea, heartburn and vomiting.  Genitourinary: Negative for  dysuria, frequency, hematuria and urgency.  Musculoskeletal: Negative for back pain, falls and myalgias.  Skin: Negative for rash.  Neurological: Negative for dizziness, sensory change, loss of consciousness, weakness and headaches.  Endo/Heme/Allergies: Negative for environmental allergies. Does not bruise/bleed easily.  Psychiatric/Behavioral: Negative for depression and suicidal ideas. The patient is not nervous/anxious and does not have insomnia.        Objective:    Physical Exam  Constitutional: She is oriented to person, place, and time. She appears well-developed and well-nourished. No distress.  HENT:  Head: Normocephalic and atraumatic.  Nose: Nose normal.  Eyes: Right eye exhibits no discharge. Left eye exhibits no discharge.  Neck: Normal range of motion. Neck supple.  Cardiovascular: Normal rate and regular rhythm.  No murmur heard. Pulmonary/Chest: Effort normal and breath sounds normal.  Abdominal: Soft. Bowel sounds are normal. There is no tenderness.  Musculoskeletal: She exhibits no edema.  Neurological: She is alert and oriented to person, place, and time.  Skin: Skin is warm and dry.  Psychiatric: She has Hubbard normal mood and affect.  Nursing note and vitals reviewed.   BP 134/90 (BP Location: Left Arm, Patient Position: Sitting, Cuff Size: Normal)   Pulse 65   Temp 98 F (36.7 C) (Oral)   Resp 16   Ht 5' 3.78" (1.62 m)   Wt 141 lb 3.2 oz (64 kg)   SpO2 98%   BMI 24.40 kg/m  Wt Readings from Last 3 Encounters:  10/23/17 141 lb 3.2 oz (64 kg)  03/10/17 141 lb 9.6 oz (64.2 kg)  09/09/16 141 lb 6 oz (64.1 kg)   BP Readings from Last 3 Encounters:  10/23/17 134/90  03/10/17 130/82  09/09/16 (!) 132/92      There is no immunization history on file for this patient.  Health Maintenance  Topic Date Due  . Hepatitis C Screening  [birth year]  . TETANUS/TDAP  06/12/1968  . COLONOSCOPY  06/13/1999  . PNA vac Low Risk Adult (1 of 2 - PCV13) 06/12/2014  .  INFLUENZA VACCINE  05/20/2017  . MAMMOGRAM  08/01/2019  . DEXA SCAN  Completed    Lab Results  Component Value Date   WBC 5.4 10/16/2017   HGB 13.7 10/16/2017   HCT 40.8 10/16/2017   PLT 167.0 10/16/2017   GLUCOSE 106 (H) 10/23/2017   CHOL 155 10/16/2017   TRIG 104.0 10/16/2017   HDL 36.90 (L) 10/16/2017   LDLCALC 97 10/16/2017   ALT 10 10/23/2017   AST 16 10/23/2017   NA 140 10/23/2017   K 5.4 (H) 10/23/2017   CL 104 10/23/2017   CREATININE 0.92 10/23/2017   BUN 16 10/23/2017   CO2 30 10/23/2017   TSH 1.03 10/16/2017    Lab Results  Component Value Date   TSH 1.03 10/16/2017   Lab Results  Component Value Date   WBC 5.4 10/16/2017   HGB 13.7 10/16/2017   HCT 40.8 10/16/2017   MCV 84.2 10/16/2017   PLT 167.0 10/16/2017   Lab Results  Component Value Date   NA 140 10/23/2017   K 5.4 (H) 10/23/2017   CO2 30 10/23/2017   GLUCOSE 106 (H) 10/23/2017   BUN 16 10/23/2017   CREATININE 0.92 10/23/2017   BILITOT 0.4 10/23/2017   ALKPHOS 72 10/23/2017   AST 16 10/23/2017   ALT 10 10/23/2017   PROT 6.9 10/23/2017   ALBUMIN 4.2 10/23/2017   CALCIUM 9.4 10/23/2017   GFR 66.97 10/23/2017   Lab Results  Component Value Date   CHOL 155 10/16/2017   Lab Results  Component Value Date   HDL 36.90 (L) 10/16/2017   Lab Results  Component Value Date   LDLCALC 97 10/16/2017   Lab Results  Component Value Date   TRIG 104.0 10/16/2017   Lab Results  Component Value Date   CHOLHDL 4 10/16/2017   No results found for: HGBA1C       Assessment & Plan:   Problem List Items Addressed This Visit    RESOLVED: Chicken pox   Hyperlipidemia, mixed    Tolerating statin, encouraged heart healthy diet, avoid trans fats, minimize simple carbs and saturated fats. Increase exercise as tolerated      Relevant Orders   Lipid panel   HTN (hypertension)    Well controlled, no changes to meds. Encouraged heart healthy diet such as the DASH diet and exercise as tolerated.        Relevant Orders   CBC   Comprehensive metabolic panel   TSH   Tobacco abuse disorder    Encouraged complete cessation. Discussed need to quit as relates to risk of numerous cancers, cardiac and pulmonary disease as well as neurologic complications. Counseled for greater than 3 minutes. Smoking 12-16 Hubbard day, declines patches or meds today      Preventative health care    Patient encouraged to maintain heart healthy diet, regular exercise, adequate sleep. Consider daily probiotics. Take medications as prescribed. Given and reviewed copy of ACP documents from [facility] Secretary of State and encouraged to complete and return      Osteopenia    Declines bone density today. Encouraged to get adequate exercise, calcium and vitamin d intake      Hyperkalemia    Recheck cmp today, asymptomatic but recheck higher. She agreed to return and repeat showed K of 5.4. EKG unremarkable. Will monitor and minimize potassium in diet.       Relevant Orders   Comprehensive metabolic panel (Completed)   Upper respiratory infection    Encouraged increased rest and hydration, add probiotics, zinc such as Coldeze or Xicam. Treat fevers as needed. Vitamin C 500 to 1000 mg daily, elderberry dialy report worsening symptoms      Tremor of both hands    Worse with resting and in am. Consider beta blockers consider referral to neurology         I am having Cynthia Hubbard start on amoxicillin. I am also having her maintain her OVER THE COUNTER MEDICATION, Vitamin D3, Probiotic Product (PROBIOTIC DAILY PO), b complex vitamins, DENTA 5000 PLUS, lisinopril, and simvastatin.  Meds ordered this encounter  Medications  . amoxicillin (AMOXIL) 500 MG capsule    Sig: Take 1 capsule (500 mg total) by mouth 3 (three) times daily.  Dispense:  30 capsule    Refill:  0    CMA served as scribe during this visit. History, Physical and Plan performed by medical provider. Documentation and orders reviewed and attested to.    Danise Edge, MD

## 2017-10-23 NOTE — Assessment & Plan Note (Signed)
Encouraged increased rest and hydration, add probiotics, zinc such as Coldeze or Xicam. Treat fevers as needed. Vitamin C 500 to 1000 mg daily, elderberry dialy report worsening symptoms

## 2017-10-23 NOTE — Assessment & Plan Note (Signed)
Tolerating statin, encouraged heart healthy diet, avoid trans fats, minimize simple carbs and saturated fats. Increase exercise as tolerated 

## 2017-10-23 NOTE — Assessment & Plan Note (Addendum)
Encouraged complete cessation. Discussed need to quit as relates to risk of numerous cancers, cardiac and pulmonary disease as well as neurologic complications. Counseled for greater than 3 minutes. Smoking 12-16 a day, declines patches or meds today

## 2017-12-17 ENCOUNTER — Other Ambulatory Visit: Payer: Self-pay | Admitting: Family Medicine

## 2017-12-28 ENCOUNTER — Telehealth: Payer: Self-pay | Admitting: Family Medicine

## 2017-12-28 NOTE — Telephone Encounter (Signed)
Copied from CRM [id number]. Topic: Quick Communication - Rx Refill/Question >> Dec 28, 2017  9:57 AM Guinevere FerrariMorris, Brooks Stotz E, NT wrote: Medication: lisinopril (PRINIVIL,ZESTRIL) 5 MG tablet   Has the patient contacted their pharmacy? Yes    (Agent: If no, request that the patient contact the pharmacy for the refill.)   Preferred Pharmacy (with phone number or street name): [id number] [place], [place] - [address] [phone] (Phone) [phone] (Fax)    Agent: Please be advised that RX refills may take up to 3 business days. We ask that you follow-up with your pharmacy.

## 2017-12-28 NOTE — Telephone Encounter (Signed)
Pt states she was on the phone with the pharmacy prior to receiving call from the office and was told that prescription was ready for pick up.

## 2018-01-09 ENCOUNTER — Other Ambulatory Visit: Payer: Self-pay | Admitting: Family Medicine

## 2018-01-13 ENCOUNTER — Other Ambulatory Visit: Payer: Self-pay

## 2018-01-13 MED ORDER — LISINOPRIL 5 MG PO TABS: 5.0000 mg | ORAL_TABLET | Freq: Two times a day (BID) | ORAL | 1 refills | Status: DC

## 2018-03-04 NOTE — Progress Notes (Deleted)
Subjective:   Cynthia Hubbard is a 67 y.o. female who presents for Medicare Annual (Subsequent) preventive examination.  Review of Systems: No ROS.  Medicare Wellness Visit. Additional risk factors are reflected in the social history.   Sleep patterns: Home Safety/Smoke Alarms: Feels safe in home. Smoke alarms in place.  Living environment; residence and Firearm Safety:    Female:   Pap-  09/04/15     Mammo- utd      Dexa scan-        CCS-    Objective:     Vitals: There were no vitals taken for this visit.  There is no height or weight on file to calculate BMI.  Advanced Directives 03/10/2017 06/21/2015 06/22/2014 06/22/2014  Does Patient Have a Medical Advance Directive? No No No No  Would patient like information on creating a medical advance directive? No - Patient declined No - patient declined information Yes - Transport planner given -    Tobacco Social History   Tobacco Use  Smoking Status Current Every Day Smoker  . Packs/day: 0.75  . Years: 40.00  . Pack years: 30.00  . Types: Cigarettes  Smokeless Tobacco Never Used     Ready to quit: Not Answered Counseling given: Not Answered   Clinical Intake:                       Past Medical History:  Diagnosis Date  . Chicken pox 67 yrs old  . H/O measles    3 day measles   . History of chicken pox   . HTN (hypertension) 03/16/2014  . Hyperlipidemia    weighed 30 pounds heavier- used to take crestor  . Hypertension    was 30 pounds heavier  . Measles 6 th grade   3 day measles  . Osteopenia 09/14/2015  . Other and unspecified hyperlipidemia 03/16/2014  . Retinal hemorrhage of right eye 03/16/2014  . Tobacco abuse disorder 03/16/2014   Past Surgical History:  Procedure Laterality Date  . REFRACTIVE SURGERY Right    Family History  Adopted: Yes   Social History   Socioeconomic History  . Marital status: Married    Spouse name: Not on file  . Number of children: Not on file  . Years of  education: Not on file  . Highest education level: Not on file  Occupational History  . Not on file  Social Needs  . Financial resource strain: Not on file  . Food insecurity:    Worry: Not on file    Inability: Not on file  . Transportation needs:    Medical: Not on file    Non-medical: Not on file  Tobacco Use  . Smoking status: Current Every Day Smoker    Packs/day: 0.75    Years: 40.00    Pack years: 30.00    Types: Cigarettes  . Smokeless tobacco: Never Used  Substance and Sexual Activity  . Alcohol use: Yes    Comment: very seldom  . Drug use: No  . Sexual activity: Yes    Comment: lives with husband, no dietary restrictions, works part time at a furniture store  Lifestyle  . Physical activity:    Days per week: Not on file    Minutes per session: Not on file  . Stress: Not on file  Relationships  . Social connections:    Talks on phone: Not on file    Gets together: Not on file    Attends religious  service: Not on file    Active member of club or organization: Not on file    Attends meetings of clubs or organizations: Not on file    Relationship status: Not on file  Other Topics Concern  . Not on file  Social History Narrative  . Not on file    Outpatient Encounter Medications as of 03/11/2018  Medication Sig  . amoxicillin (AMOXIL) 500 MG capsule Take 1 capsule (500 mg total) by mouth 3 (three) times daily.  Marland Kitchen b complex vitamins tablet Take 1 tablet by mouth daily.  . Cholecalciferol (VITAMIN D3) 5000 UNITS CAPS Take 1 capsule by mouth daily.  . DENTA 5000 PLUS 1.1 % CREA dental cream USE AS DIRECTED TWO TIMES DAILY  . lisinopril (PRINIVIL,ZESTRIL) 5 MG tablet Take 1 tablet (5 mg total) by mouth 2 (two) times daily.  Marland Kitchen OVER THE COUNTER MEDICATION Mega red daily  . Probiotic Product (PROBIOTIC DAILY PO) Take by mouth daily.  . simvastatin (ZOCOR) 10 MG tablet TAKE 1 TABLET BY MOUTH EVERY DAY   No facility-administered encounter medications on file as of  03/11/2018.     Activities of Daily Living In your present state of health, do you have any difficulty performing the following activities: 03/10/2017  Hearing? N  Vision? N  Difficulty concentrating or making decisions? N  Walking or climbing stairs? N  Dressing or bathing? N  Preparing Food and eating ? N  Using the Toilet? N  In the past six months, have you accidently leaked urine? N  Do you have problems with loss of bowel control? N  Managing your Medications? N  Managing your Finances? N  Housekeeping or managing your Housekeeping? N  Some recent data might be hidden    Patient Care Team: Bradd Canary, MD as PCP - General (Family Medicine)    Assessment:   This is a routine wellness examination for [place]. Physical assessment deferred to PCP.  Exercise Activities and Dietary recommendations   Diet (meal preparation, eat out, water intake, caffeinated beverages, dairy products, fruits and vegetables): {Desc; diets:16563} Breakfast: Lunch:  Dinner:      Goals    . Increase physical activity     Yoga and walking in park.      . Weight (lb) < 130 lb (59 kg)       Fall Risk Fall Risk  03/10/2017 09/09/2016 09/04/2015 06/21/2015 06/22/2014  Falls in the past year? No No Yes Yes No  Number falls in past yr: - - 1 1 -  Comment - - - At work 2 weeks ago---on an area.   -  Injury with Fall? - - No No -  Follow up - - Falls prevention discussed;Falls evaluation completed Education provided -    Depression Screen PHQ 2/9 Scores 03/10/2017 09/09/2016 09/04/2015 06/21/2015  PHQ - 2 Score 0 0 0 0     Cognitive Function MMSE - Mini Mental State Exam 06/21/2015  Orientation to time 5  Orientation to Place 5  Registration 3  Attention/ Calculation 5  Recall 3  Language- name 2 objects 2  Language- repeat 1  Language- follow 3 step command 3  Language- read & follow direction 1  Write a sentence 1  Copy design 1  Total score 30         There is no immunization  history on file for this patient.  Screening Tests Health Maintenance  Topic Date Due  . Hepatitis C Screening  02/21/1951  .  TETANUS/TDAP  06/12/1968  . COLONOSCOPY  06/13/1999  . PNA vac Low Risk Adult (1 of 2 - PCV13) 06/12/2014  . INFLUENZA VACCINE  05/20/2018  . MAMMOGRAM  08/01/2019  . DEXA SCAN  Completed      Plan:   ***   I have personally reviewed and noted the following in the patient's chart:   . Medical and social history . Use of alcohol, tobacco or illicit drugs  . Current medications and supplements . Functional ability and status . Nutritional status . Physical activity . Advanced directives . List of other physicians . Hospitalizations, surgeries, and ER visits in previous 12 months . Vitals . Screenings to include cognitive, depression, and falls . Referrals and appointments  In addition, I have reviewed and discussed with patient certain preventive protocols, quality metrics, and best practice recommendations. A written personalized care plan for preventive services as well as general preventive health recommendations were provided to patient.     Avon Gully, [place]  03/04/2018

## 2018-03-11 ENCOUNTER — Ambulatory Visit: Payer: Medicare HMO | Admitting: *Deleted

## 2018-03-31 DIAGNOSIS — R69 Illness, unspecified: Secondary | ICD-10-CM | POA: Diagnosis not present

## 2018-04-21 NOTE — Progress Notes (Addendum)
Subjective:   Cynthia Hubbard is a 67 y.o. female who presents for Medicare Annual (Subsequent) preventive examination.  Review of Systems: No ROS.  Medicare Wellness Visit. Additional risk factors are reflected in the social history.  Cardiac Risk Factors include: advanced age (>61men, >70 women);dyslipidemia;hypertension Sleep patterns: sleep varies. Reports feeling rested most of the time.  Home Safety/Smoke Alarms: Feels safe in home. Smoke alarms in place.  Living environment; residence and Firearm Safety: Lives with husband. 2 story home.    Female:   Pap-09/04/15       Mammo- utd      Dexa scan-  ordered      CCS- declines Eye- pt states she has not been to eye doctor in a couple of yrs, but will schedule appt soon.  Pt declines pneumonia vaccine today.     Objective:     Vitals: BP 122/80 (BP Location: Left Arm, Patient Position: Sitting, Cuff Size: Normal)   Pulse 65   Ht 5\' 4"  (1.626 m)   Wt 135 lb 9.6 oz (61.5 kg)   SpO2 96%   BMI 23.28 kg/m   Body mass index is 23.28 kg/m.  Advanced Directives 04/26/2018 03/10/2017 06/21/2015 06/22/2014 06/22/2014  Does Patient Have a Medical Advance Directive? No No No No No  Would patient like information on creating a medical advance directive? Yes (MAU/Ambulatory/Procedural Areas - Information given) No - Patient declined No - patient declined information Yes - Educational materials given -    Tobacco Social History   Tobacco Use  Smoking Status Current Every Day Smoker  . Packs/day: 0.75  . Years: 40.00  . Pack years: 30.00  . Types: Cigarettes  Smokeless Tobacco Never Used  Tobacco Comment   pt declines cessation material     Ready to quit: No Counseling given: No Comment: pt declines cessation material   Clinical Intake:  Pain : No/denies pain     Past Medical History:  Diagnosis Date  . Chicken pox 67 yrs old  . H/O measles    3 day measles   . History of chicken pox   . HTN (hypertension) 03/16/2014  .  Hyperlipidemia    weighed 30 pounds heavier- used to take crestor  . Hypertension    was 30 pounds heavier  . Measles 6 th grade   3 day measles  . Osteopenia 09/14/2015  . Other and unspecified hyperlipidemia 03/16/2014  . Retinal hemorrhage of right eye 03/16/2014  . Tobacco abuse disorder 03/16/2014   Past Surgical History:  Procedure Laterality Date  . REFRACTIVE SURGERY Right    Family History  Adopted: Yes   Social History   Socioeconomic History  . Marital status: Married    Spouse name: Not on file  . Number of children: Not on file  . Years of education: Not on file  . Highest education level: Not on file  Occupational History  . Not on file  Social Needs  . Financial resource strain: Not on file  . Food insecurity:    Worry: Not on file    Inability: Not on file  . Transportation needs:    Medical: Not on file    Non-medical: Not on file  Tobacco Use  . Smoking status: Current Every Day Smoker    Packs/day: 0.75    Years: 40.00    Pack years: 30.00    Types: Cigarettes  . Smokeless tobacco: Never Used  . Tobacco comment: pt declines cessation material  Substance and Sexual Activity  .  Alcohol use: Yes    Comment: very seldom  . Drug use: No  . Sexual activity: Yes    Comment: lives with husband, no dietary restrictions, works part time at a furniture store  Lifestyle  . Physical activity:    Days per week: Not on file    Minutes per session: Not on file  . Stress: Not on file  Relationships  . Social connections:    Talks on phone: Not on file    Gets together: Not on file    Attends religious service: Not on file    Active member of club or organization: Not on file    Attends meetings of clubs or organizations: Not on file    Relationship status: Not on file  Other Topics Concern  . Not on file  Social History Narrative  . Not on file    Outpatient Encounter Medications as of 04/26/2018  Medication Sig  . Cholecalciferol (VITAMIN D3) 5000  UNITS CAPS Take 1 capsule by mouth daily.  . DENTA 5000 PLUS 1.1 % CREA dental cream USE AS DIRECTED TWO TIMES DAILY  . lisinopril (PRINIVIL,ZESTRIL) 5 MG tablet Take 1 tablet (5 mg total) by mouth 2 (two) times daily.  Marland Kitchen OVER THE COUNTER MEDICATION Mega red daily  . Probiotic Product (PROBIOTIC DAILY PO) Take by mouth daily.  . simvastatin (ZOCOR) 10 MG tablet TAKE 1 TABLET BY MOUTH EVERY DAY  . [DISCONTINUED] amoxicillin (AMOXIL) 500 MG capsule Take 1 capsule (500 mg total) by mouth 3 (three) times daily.  . [DISCONTINUED] b complex vitamins tablet Take 1 tablet by mouth daily.   No facility-administered encounter medications on file as of 04/26/2018.     Activities of Daily Living In your present state of health, do you have any difficulty performing the following activities: 04/26/2018  Hearing? N  Vision? N  Comment wearing glasses.   Difficulty concentrating or making decisions? N  Walking or climbing stairs? N  Dressing or bathing? N  Doing errands, shopping? N  Preparing Food and eating ? N  Using the Toilet? N  In the past six months, have you accidently leaked urine? N  Do you have problems with loss of bowel control? N  Managing your Medications? N  Managing your Finances? N  Housekeeping or managing your Housekeeping? N  Some recent data might be hidden    Patient Care Team: Bradd Canary, MD as PCP - General (Family Medicine)    Assessment:   This is a routine wellness examination for Cynthia Hubbard. Physical assessment deferred to PCP.  Exercise Activities and Dietary recommendations Current Exercise Habits: The patient does not participate in regular exercise at present, Exercise limited by: None identified Diet (meal preparation, eat out, water intake, caffeinated beverages, dairy products, fruits and vegetables):  Breakfast: Coffee and smoke. Skips food Lunch: varies Dinner: baked chicken, field peas, butter beans, and corn.  Goals    . Increase physical activity       Yoga and walking in park.      . Weight (lb) < 130 lb (59 kg)       Fall Risk Fall Risk  04/26/2018 03/10/2017 09/09/2016 09/04/2015 06/21/2015  Falls in the past year? No No No Yes Yes  Number falls in past yr: - - - 1 1  Comment - - - - At work 2 weeks ago---on an area.    Injury with Fall? - - - No No  Follow up - - - Falls prevention discussed;Falls  evaluation completed Education provided    Depression Screen PHQ 2/9 Scores 04/26/2018 03/10/2017 09/09/2016 09/04/2015  PHQ - 2 Score 0 0 0 0     Cognitive Function MMSE - Mini Mental State Exam 04/26/2018 06/21/2015  Orientation to time 5 5  Orientation to Place 5 5  Registration 3 3  Attention/ Calculation 5 5  Recall 2 3  Language- name 2 objects 2 2  Language- repeat 1 1  Language- follow 3 step command 3 3  Language- read & follow direction 1 1  Write a sentence 1 1  Copy design 1 1  Total score 29 30         There is no immunization history on file for this patient.   Screening Tests Health Maintenance  Topic Date Due  . Hepatitis C Screening  07/03/1951  . TETANUS/TDAP  06/12/1968  . COLONOSCOPY  04/27/2019 (Originally 06/13/1999)  . PNA vac Low Risk Adult (1 of 2 - PCV13) 04/27/2019 (Originally 06/12/2014)  . INFLUENZA VACCINE  05/20/2018  . MAMMOGRAM  08/01/2019  . DEXA SCAN  Completed       Plan:    Please schedule your next medicare wellness visit with me in 1 yr.  Continue to eat heart healthy diet (full of fruits, vegetables, whole grains, lean protein, water--limit salt, fat, and sugar intake) and increase physical activity as tolerated.  Continue doing brain stimulating activities (puzzles, reading, adult coloring books, staying active) to keep memory sharp.   Bring a copy of your living will and/or healthcare power of attorney to your next office visit.  I have ordered your bone density scan. Please schedule.    I have personally reviewed and noted the following in the patient's chart:    . Medical and social history . Use of alcohol, tobacco or illicit drugs  . Current medications and supplements . Functional ability and status . Nutritional status . Physical activity . Advanced directives . List of other physicians . Hospitalizations, surgeries, and ER visits in previous 12 months . Vitals . Screenings to include cognitive, depression, and falls . Referrals and appointments  In addition, I have reviewed and discussed with patient certain preventive protocols, quality metrics, and best practice recommendations. A written personalized care plan for preventive services as well as general preventive health recommendations were provided to patient.     Avon GullyBritt, Yolandra Habig Angel, [place]  04/26/2018   Medical screening examination/treatment was performed by qualified clinical staff member and as supervising physician I was immediately available for consultation/collaboration. I have reviewed documentation and agree with assessment and plan.  Danise EdgeStacey Blyth, MD

## 2018-04-23 ENCOUNTER — Other Ambulatory Visit (INDEPENDENT_AMBULATORY_CARE_PROVIDER_SITE_OTHER): Payer: Medicare HMO

## 2018-04-23 DIAGNOSIS — I10 Essential (primary) hypertension: Secondary | ICD-10-CM | POA: Diagnosis not present

## 2018-04-23 DIAGNOSIS — E782 Mixed hyperlipidemia: Secondary | ICD-10-CM | POA: Diagnosis not present

## 2018-04-23 LAB — COMPREHENSIVE METABOLIC PANEL
ALBUMIN: 4.2 g/dL (ref 3.5–5.2)
ALK PHOS: 77 U/L (ref 39–117)
ALT: 10 U/L (ref 0–35)
AST: 16 U/L (ref 0–37)
BUN: 15 mg/dL (ref 6–23)
CALCIUM: 9.7 mg/dL (ref 8.4–10.5)
CHLORIDE: 105 meq/L (ref 96–112)
CO2: 28 mEq/L (ref 19–32)
CREATININE: 0.88 mg/dL (ref 0.40–1.20)
GFR: 67.74 mL/min (ref >60.00)
Glucose, Bld: 98 mg/dL (ref 70–99)
POTASSIUM: 5.3 meq/L — AB (ref 3.5–5.1)
Sodium: 139 mEq/L (ref 135–145)
TOTAL PROTEIN: 7.1 g/dL (ref 6.0–8.3)
Total Bilirubin: 0.4 mg/dL (ref 0.2–1.2)

## 2018-04-23 LAB — TSH: TSH: 0.91 u[IU]/mL (ref 0.35–4.50)

## 2018-04-23 LAB — LIPID PANEL
CHOLESTEROL: 151 mg/dL (ref 0–200)
HDL: 32.8 mg/dL — ABNORMAL LOW (ref >39.00)
LDL CALC: 98 mg/dL (ref 0–99)
NonHDL: 118.09
TRIGLYCERIDES: 102 mg/dL (ref 0.0–149.0)
Total CHOL/HDL Ratio: 5
VLDL: 20.4 mg/dL (ref 0.0–40.0)

## 2018-04-23 LAB — CBC
HEMATOCRIT: 39.6 % (ref 36.0–46.0)
Hemoglobin: 13.5 g/dL (ref 12.0–15.0)
MCHC: 34.2 g/dL (ref 30.0–36.0)
MCV: 81.5 fl (ref 78.0–100.0)
Platelets: 175 10*3/uL (ref 150.0–400.0)
RBC: 4.86 Mil/uL (ref 3.87–5.11)
RDW: 13.3 % (ref 11.5–15.5)
WBC: 6.1 10*3/uL (ref 4.0–10.5)

## 2018-04-26 ENCOUNTER — Ambulatory Visit (INDEPENDENT_AMBULATORY_CARE_PROVIDER_SITE_OTHER): Payer: Medicare HMO | Admitting: Family Medicine

## 2018-04-26 ENCOUNTER — Ambulatory Visit: Payer: Medicare HMO | Admitting: *Deleted

## 2018-04-26 ENCOUNTER — Encounter: Payer: Self-pay | Admitting: *Deleted

## 2018-04-26 ENCOUNTER — Ambulatory Visit: Payer: Medicare HMO | Admitting: Family Medicine

## 2018-04-26 ENCOUNTER — Ambulatory Visit (INDEPENDENT_AMBULATORY_CARE_PROVIDER_SITE_OTHER): Payer: Medicare HMO | Admitting: *Deleted

## 2018-04-26 VITALS — BP 122/80 | HR 65 | Temp 98.6°F | Resp 18 | Wt 135.0 lb

## 2018-04-26 VITALS — BP 122/80 | HR 65 | Ht 64.0 in | Wt 135.6 lb

## 2018-04-26 DIAGNOSIS — R739 Hyperglycemia, unspecified: Secondary | ICD-10-CM | POA: Diagnosis not present

## 2018-04-26 DIAGNOSIS — E782 Mixed hyperlipidemia: Secondary | ICD-10-CM

## 2018-04-26 DIAGNOSIS — Z0000 Encounter for general adult medical examination without abnormal findings: Secondary | ICD-10-CM

## 2018-04-26 DIAGNOSIS — M8580 Other specified disorders of bone density and structure, unspecified site: Secondary | ICD-10-CM | POA: Diagnosis not present

## 2018-04-26 DIAGNOSIS — Z720 Tobacco use: Secondary | ICD-10-CM | POA: Diagnosis not present

## 2018-04-26 DIAGNOSIS — Z780 Asymptomatic menopausal state: Secondary | ICD-10-CM

## 2018-04-26 DIAGNOSIS — I10 Essential (primary) hypertension: Secondary | ICD-10-CM

## 2018-04-26 NOTE — Patient Instructions (Signed)
Please schedule your next medicare wellness visit with me in 1 yr.  Continue to eat heart healthy diet (full of fruits, vegetables, whole grains, lean protein, water--limit salt, fat, and sugar intake) and increase physical activity as tolerated.  Continue doing brain stimulating activities (puzzles, reading, adult coloring books, staying active) to keep memory sharp.   Bring a copy of your living will and/or healthcare power of attorney to your next office visit.  I have ordered your bone density scan. Please schedule.    Cynthia Hubbard , Thank you for taking time to come for your Medicare Wellness Visit. I appreciate your ongoing commitment to your health goals. Please review the following plan we discussed and let me know if I can assist you in the future.   These are the goals we discussed: Goals    . Increase physical activity     Yoga and walking in park.      . Weight (lb) < 130 lb (59 kg)       This is a list of the screening recommended for you and due dates:  Health Maintenance  Topic Date Due  .  Hepatitis C: One time screening is recommended by Center for Disease Control  (CDC) for  adults born from [birth year] through 1967.   [birth year]  . Tetanus Vaccine  06/12/1968  . Colon Cancer Screening  04/27/2019*  . Pneumonia vaccines (1 of 2 - PCV13) 04/27/2019*  . Flu Shot  05/20/2018  . Mammogram  08/01/2019  . DEXA scan (bone density measurement)  Completed  *Topic was postponed. The date shown is not the original due date.    Health Maintenance for Postmenopausal Women Menopause is a normal process in which your reproductive ability comes to an end. This process happens gradually over a span of months to years, usually between the ages of 64 and 66. Menopause is complete when you have missed 12 consecutive menstrual periods. It is important to talk with your health care provider about some of the most common conditions that affect postmenopausal women, such as heart disease,  cancer, and bone loss (osteoporosis). Adopting a healthy lifestyle and getting preventive care can help to promote your health and wellness. Those actions can also lower your chances of developing some of these common conditions. What should I know about menopause? During menopause, you may experience a number of symptoms, such as:  Moderate-to-severe hot flashes.  Night sweats.  Decrease in sex drive.  Mood swings.  Headaches.  Tiredness.  Irritability.  Memory problems.  Insomnia.  Choosing to treat or not to treat menopausal changes is an individual decision that you make with your health care provider. What should I know about hormone replacement therapy and supplements? Hormone therapy products are effective for treating symptoms that are associated with menopause, such as hot flashes and night sweats. Hormone replacement carries certain risks, especially as you become older. If you are thinking about using estrogen or estrogen with progestin treatments, discuss the benefits and risks with your health care provider. What should I know about heart disease and stroke? Heart disease, heart attack, and stroke become more likely as you age. This may be due, in part, to the hormonal changes that your body experiences during menopause. These can affect how your body processes dietary fats, triglycerides, and cholesterol. Heart attack and stroke are both medical emergencies. There are many things that you can do to help prevent heart disease and stroke:  Have your blood pressure checked at least every  1-2 years. High blood pressure causes heart disease and increases the risk of stroke.  If you are 106-66 years old, ask your health care provider if you should take aspirin to prevent a heart attack or a stroke.  Do not use any tobacco products, including cigarettes, chewing tobacco, or electronic cigarettes. If you need help quitting, ask your health care provider.  It is important to  eat a healthy diet and maintain a healthy weight. ? Be sure to include plenty of vegetables, fruits, low-fat dairy products, and lean protein. ? Avoid eating foods that are high in solid fats, added sugars, or salt (sodium).  Get regular exercise. This is one of the most important things that you can do for your health. ? Try to exercise for at least 150 minutes each week. The type of exercise that you do should increase your heart rate and make you sweat. This is known as moderate-intensity exercise. ? Try to do strengthening exercises at least twice each week. Do these in addition to the moderate-intensity exercise.  Know your numbers.Ask your health care provider to check your cholesterol and your blood glucose. Continue to have your blood tested as directed by your health care provider.  What should I know about cancer screening? There are several types of cancer. Take the following steps to reduce your risk and to catch any cancer development as early as possible. Breast Cancer  Practice breast self-awareness. ? This means understanding how your breasts normally appear and feel. ? It also means doing regular breast self-exams. Let your health care provider know about any changes, no matter how small.  If you are 29 or older, have a clinician do a breast exam (clinical breast exam or CBE) every year. Depending on your age, family history, and medical history, it may be recommended that you also have a yearly breast X-ray (mammogram).  If you have a family history of breast cancer, talk with your health care provider about genetic screening.  If you are at high risk for breast cancer, talk with your health care provider about having an MRI and a mammogram every year.  Breast cancer (BRCA) gene test is recommended for women who have family members with BRCA-related cancers. Results of the assessment will determine the need for genetic counseling and BRCA1 and for BRCA2 testing.  BRCA-related cancers include these types: ? Breast. This occurs in males or females. ? Ovarian. ? Tubal. This may also be called fallopian tube cancer. ? Cancer of the abdominal or pelvic lining (peritoneal cancer). ? Prostate. ? Pancreatic.  Cervical, Uterine, and Ovarian Cancer Your health care provider may recommend that you be screened regularly for cancer of the pelvic organs. These include your ovaries, uterus, and vagina. This screening involves a pelvic exam, which includes checking for microscopic changes to the surface of your cervix (Pap test).  For women ages 21-65, health care providers may recommend a pelvic exam and a Pap test every three years. For women ages 39-65, they may recommend the Pap test and pelvic exam, combined with testing for human papilloma virus (HPV), every five years. Some types of HPV increase your risk of cervical cancer. Testing for HPV may also be done on women of any age who have unclear Pap test results.  Other health care providers may not recommend any screening for nonpregnant women who are considered low risk for pelvic cancer and have no symptoms. Ask your health care provider if a screening pelvic exam is right for you.  If you have had past treatment for cervical cancer or a condition that could lead to cancer, you need Pap tests and screening for cancer for at least 20 years after your treatment. If Pap tests have been discontinued for you, your risk factors (such as having a new sexual partner) need to be reassessed to determine if you should start having screenings again. Some women have medical problems that increase the chance of getting cervical cancer. In these cases, your health care provider may recommend that you have screening and Pap tests more often.  If you have a family history of uterine cancer or ovarian cancer, talk with your health care provider about genetic screening.  If you have vaginal bleeding after reaching menopause, tell  your health care provider.  There are currently no reliable tests available to screen for ovarian cancer.  Lung Cancer Lung cancer screening is recommended for adults 44-64 years old who are at high risk for lung cancer because of a history of smoking. A yearly low-dose CT scan of the lungs is recommended if you:  Currently smoke.  Have a history of at least 30 pack-years of smoking and you currently smoke or have quit within the past 15 years. A pack-year is smoking an average of one pack of cigarettes per day for one year.  Yearly screening should:  Continue until it has been 15 years since you quit.  Stop if you develop a health problem that would prevent you from having lung cancer treatment.  Colorectal Cancer  This type of cancer can be detected and can often be prevented.  Routine colorectal cancer screening usually begins at age 52 and continues through age 35.  If you have risk factors for colon cancer, your health care provider may recommend that you be screened at an earlier age.  If you have a family history of colorectal cancer, talk with your health care provider about genetic screening.  Your health care provider may also recommend using home test kits to check for hidden blood in your stool.  A small camera at the end of a tube can be used to examine your colon directly (sigmoidoscopy or colonoscopy). This is done to check for the earliest forms of colorectal cancer.  Direct examination of the colon should be repeated every 5-10 years until age 72. However, if early forms of precancerous polyps or small growths are found or if you have a family history or genetic risk for colorectal cancer, you may need to be screened more often.  Skin Cancer  Check your skin from head to toe regularly.  Monitor any moles. Be sure to tell your health care provider: ? About any new moles or changes in moles, especially if there is a change in a mole's shape or color. ? If you  have a mole that is larger than the size of a pencil eraser.  If any of your family members has a history of skin cancer, especially at a young age, talk with your health care provider about genetic screening.  Always use sunscreen. Apply sunscreen liberally and repeatedly throughout the day.  Whenever you are outside, protect yourself by wearing long sleeves, pants, a wide-brimmed hat, and sunglasses.  What should I know about osteoporosis? Osteoporosis is a condition in which bone destruction happens more quickly than new bone creation. After menopause, you may be at an increased risk for osteoporosis. To help prevent osteoporosis or the bone fractures that can happen because of osteoporosis, the following is  recommended:  If you are 61-65 years old, get at least 1,000 mg of calcium and at least 600 mg of vitamin D per day.  If you are older than age 89 but younger than age 87, get at least 1,200 mg of calcium and at least 600 mg of vitamin D per day.  If you are older than age 47, get at least 1,200 mg of calcium and at least 800 mg of vitamin D per day.  Smoking and excessive alcohol intake increase the risk of osteoporosis. Eat foods that are rich in calcium and vitamin D, and do weight-bearing exercises several times each week as directed by your health care provider. What should I know about how menopause affects my mental health? Depression may occur at any age, but it is more common as you become older. Common symptoms of depression include:  Low or sad mood.  Changes in sleep patterns.  Changes in appetite or eating patterns.  Feeling an overall lack of motivation or enjoyment of activities that you previously enjoyed.  Frequent crying spells.  Talk with your health care provider if you think that you are experiencing depression. What should I know about immunizations? It is important that you get and maintain your immunizations. These include:  Tetanus, diphtheria, and  pertussis (Tdap) booster vaccine.  Influenza every year before the flu season begins.  Pneumonia vaccine.  Shingles vaccine.  Your health care provider may also recommend other immunizations. This information is not intended to replace advice given to you by your health care provider. Make sure you discuss any questions you have with your health care provider. Document Released: 11/28/2005 Document Revised: 04/25/2016 Document Reviewed: 07/10/2015 Elsevier Interactive Patient Education  2018 [employer].

## 2018-04-26 NOTE — Progress Notes (Signed)
Subjective:  I acted as a Neurosurgeon for Dr. Abner Greenspan. [place], [place]  Patient ID: Cynthia Hubbard, female    DOB: 11/20/1950, 67 y.o.   MRN: [id number]  No chief complaint on file.   HPI  Patient is in today for 6 month follow up and over all she is doing well. No recent febrile illness or hospitalizations. She has just completed her AWV. No concerns identified. She is trying to stay active. Denies CP/palp/SOB/HA/congestion/fevers/GI or GU c/o. Taking meds as prescribed  Patient Care Team: Bradd Canary, MD as PCP - General (Family Medicine)   Past Medical History:  Diagnosis Date  . Chicken pox 67 yrs old  . H/O measles    3 day measles   . History of chicken pox   . HTN (hypertension) 03/16/2014  . Hyperlipidemia    weighed 30 pounds heavier- used to take crestor  . Hypertension    was 30 pounds heavier  . Measles 6 th grade   3 day measles  . Osteopenia 09/14/2015  . Other and unspecified hyperlipidemia 03/16/2014  . Retinal hemorrhage of right eye 03/16/2014  . Tobacco abuse disorder 03/16/2014    Past Surgical History:  Procedure Laterality Date  . REFRACTIVE SURGERY Right     Family History  Adopted: Yes    Social History   Socioeconomic History  . Marital status: Married    Spouse name: Not on file  . Number of children: Not on file  . Years of education: Not on file  . Highest education level: Not on file  Occupational History  . Not on file  Social Needs  . Financial resource strain: Not on file  . Food insecurity:    Worry: Not on file    Inability: Not on file  . Transportation needs:    Medical: Not on file    Non-medical: Not on file  Tobacco Use  . Smoking status: Current Every Day Smoker    Packs/day: 0.75    Years: 40.00    Pack years: 30.00    Types: Cigarettes  . Smokeless tobacco: Never Used  . Tobacco comment: pt declines cessation material  Substance and Sexual Activity  . Alcohol use: Yes    Comment: very seldom  . Drug use: No  .  Sexual activity: Yes    Comment: lives with husband, no dietary restrictions, works part time at a furniture store  Lifestyle  . Physical activity:    Days per week: Not on file    Minutes per session: Not on file  . Stress: Not on file  Relationships  . Social connections:    Talks on phone: Not on file    Gets together: Not on file    Attends religious service: Not on file    Active member of club or organization: Not on file    Attends meetings of clubs or organizations: Not on file    Relationship status: Not on file  . Intimate partner violence:    Fear of current or ex partner: Not on file    Emotionally abused: Not on file    Physically abused: Not on file    Forced sexual activity: Not on file  Other Topics Concern  . Not on file  Social History Narrative  . Not on file    Outpatient Medications Prior to Visit  Medication Sig Dispense Refill  . Cholecalciferol (VITAMIN D3) 5000 UNITS CAPS Take 1 capsule by mouth daily.    . DENTA 5000  PLUS 1.1 % CREA dental cream USE AS DIRECTED TWO TIMES DAILY  3  . lisinopril (PRINIVIL,ZESTRIL) 5 MG tablet Take 1 tablet (5 mg total) by mouth 2 (two) times daily. 180 tablet 1  . OVER THE COUNTER MEDICATION Mega red daily    . Probiotic Product (PROBIOTIC DAILY PO) Take by mouth daily.    . simvastatin (ZOCOR) 10 MG tablet TAKE 1 TABLET BY MOUTH EVERY DAY 90 tablet 1   No facility-administered medications prior to visit.     No Known Allergies  Review of Systems  Constitutional: Negative for fever and malaise/fatigue.  HENT: Negative for congestion.   Eyes: Negative for blurred vision.  Respiratory: Negative for shortness of breath.   Cardiovascular: Negative for chest pain, palpitations and leg swelling.  Gastrointestinal: Negative for abdominal pain, blood in stool and nausea.  Genitourinary: Negative for dysuria and frequency.  Musculoskeletal: Negative for falls.  Skin: Negative for rash.  Neurological: Negative for  dizziness, loss of consciousness and headaches.  Endo/Heme/Allergies: Negative for environmental allergies.  Psychiatric/Behavioral: Negative for depression. The patient is not nervous/anxious.        Objective:    Physical Exam  Constitutional: No distress.  HENT:  Left Ear: External ear normal.  Mouth/Throat: No oropharyngeal exudate.  Eyes: EOM are normal. Left eye exhibits no discharge. No scleral icterus.  Neck: No JVD present. No tracheal deviation present.  Cardiovascular: Normal heart sounds and intact distal pulses.  Pulmonary/Chest: No respiratory distress. She has no rales.  Abdominal: She exhibits no distension and no mass. There is no tenderness. There is no guarding.  Musculoskeletal: She exhibits no edema or tenderness.  Lymphadenopathy:    She has no cervical adenopathy.  Skin: No rash noted. No erythema.    BP 122/80 (BP Location: Left Arm, Patient Position: Sitting, Cuff Size: Normal)   Pulse 65   Temp 98.6 F (37 C) (Oral)   Resp 18   Wt 135 lb (61.2 kg)   SpO2 96%   BMI 23.17 kg/m  Wt Readings from Last 3 Encounters:  04/26/18 135 lb (61.2 kg)  04/26/18 135 lb 9.6 oz (61.5 kg)  10/23/17 141 lb 3.2 oz (64 kg)   BP Readings from Last 3 Encounters:  04/26/18 122/80  04/26/18 122/80  10/23/17 134/90      There is no immunization history on file for this patient.  Health Maintenance  Topic Date Due  . Hepatitis C Screening  June 24, 1951  . TETANUS/TDAP  06/12/1968  . COLONOSCOPY  04/27/2019 (Originally 06/13/1999)  . PNA vac Low Risk Adult (1 of 2 - PCV13) 04/27/2019 (Originally 06/12/2014)  . INFLUENZA VACCINE  05/20/2018  . MAMMOGRAM  08/01/2019  . DEXA SCAN  Completed    Lab Results  Component Value Date   WBC 6.1 04/23/2018   HGB 13.5 04/23/2018   HCT 39.6 04/23/2018   PLT 175.0 04/23/2018   GLUCOSE 98 04/23/2018   CHOL 151 04/23/2018   TRIG 102.0 04/23/2018   HDL 32.80 (L) 04/23/2018   LDLCALC 98 04/23/2018   ALT 10 04/23/2018   AST  16 04/23/2018   NA 139 04/23/2018   K 5.3 (H) 04/23/2018   CL 105 04/23/2018   CREATININE 0.88 04/23/2018   BUN 15 04/23/2018   CO2 28 04/23/2018   TSH 0.91 04/23/2018    Lab Results  Component Value Date   TSH 0.91 04/23/2018   Lab Results  Component Value Date   WBC 6.1 04/23/2018   HGB 13.5 04/23/2018  HCT 39.6 04/23/2018   MCV 81.5 04/23/2018   PLT 175.0 04/23/2018   Lab Results  Component Value Date   NA 139 04/23/2018   K 5.3 (H) 04/23/2018   CO2 28 04/23/2018   GLUCOSE 98 04/23/2018   BUN 15 04/23/2018   CREATININE 0.88 04/23/2018   BILITOT 0.4 04/23/2018   ALKPHOS 77 04/23/2018   AST 16 04/23/2018   ALT 10 04/23/2018   PROT 7.1 04/23/2018   ALBUMIN 4.2 04/23/2018   CALCIUM 9.7 04/23/2018   GFR 67.74 04/23/2018   Lab Results  Component Value Date   CHOL 151 04/23/2018   Lab Results  Component Value Date   HDL 32.80 (L) 04/23/2018   Lab Results  Component Value Date   LDLCALC 98 04/23/2018   Lab Results  Component Value Date   TRIG 102.0 04/23/2018   Lab Results  Component Value Date   CHOLHDL 5 04/23/2018   No results found for: HGBA1C       Assessment & Plan:   Problem List Items Addressed This Visit    Hyperlipidemia, mixed    Encouraged heart healthy diet, increase exercise, avoid trans fats, consider a krill oil cap daily      Relevant Orders   Lipid panel   TSH   HTN (hypertension)    Well controlled, no changes to meds. Encouraged heart healthy diet such as the DASH diet and exercise as tolerated.       Relevant Orders   CBC   Comprehensive metabolic panel   TSH   Tobacco abuse disorder    Encouraged complete cessation. Discussed need to quit as relates to risk of numerous cancers, cardiac and pulmonary disease as well as neurologic complications. Counseled for greater than 3 minutes      Osteopenia    Encouraged to get adequate exercise, calcium and vitamin d intake      Hyperglycemia - Primary    hgba1c  acceptable, minimize simple carbs. Increase exercise as tolerated.       Relevant Orders   Hemoglobin A1c      I am having Rosamaria Lintsheryl Encina maintain her OVER THE COUNTER MEDICATION, Vitamin D3, Probiotic Product (PROBIOTIC DAILY PO), DENTA 5000 PLUS, simvastatin, and lisinopril.  No orders of the defined types were placed in this encounter.   .team Danise EdgeStacey Blyth, MD

## 2018-04-26 NOTE — Assessment & Plan Note (Signed)
Encouraged heart healthy diet, increase exercise, avoid trans fats, consider a krill oil cap daily 

## 2018-04-26 NOTE — Assessment & Plan Note (Signed)
Encouraged complete cessation. Discussed need to quit as relates to risk of numerous cancers, cardiac and pulmonary disease as well as neurologic complications. Counseled for greater than 3 minutes 

## 2018-04-26 NOTE — Patient Instructions (Signed)

## 2018-04-26 NOTE — Assessment & Plan Note (Signed)
Well controlled, no changes to meds. Encouraged heart healthy diet such as the DASH diet and exercise as tolerated.  °

## 2018-04-28 DIAGNOSIS — R739 Hyperglycemia, unspecified: Secondary | ICD-10-CM | POA: Insufficient documentation

## 2018-04-28 NOTE — Assessment & Plan Note (Signed)
Encouraged to get adequate exercise, calcium and vitamin d intake 

## 2018-04-28 NOTE — Assessment & Plan Note (Signed)
hgba1c acceptable, minimize simple carbs. Increase exercise as tolerated.  

## 2018-07-22 ENCOUNTER — Other Ambulatory Visit: Payer: Self-pay | Admitting: Family Medicine

## 2018-07-22 MED ORDER — SIMVASTATIN 10 MG PO TABS: 10.0000 mg | ORAL_TABLET | Freq: Every day | ORAL | 1 refills | Status: DC

## 2018-07-22 NOTE — Telephone Encounter (Signed)
Rx sent 

## 2018-07-22 NOTE — Telephone Encounter (Signed)
Copied from CRM [id number]. Topic: Quick Communication - Rx Refill/Question >> Jul 22, 2018  9:02 AM Baldo Daub L wrote: Medication: simvastatin (ZOCOR) 10 MG tablet  Has the patient contacted their pharmacy? Yes - states they haven't heard from [place]:  pt states she only has one pill left (Agent: If no, request that the patient contact the pharmacy for the refill.) (Agent: If yes, when and what did the pharmacy advise?)  Preferred Pharmacy (with phone number or street name): [id number] [place], [place] - [address] [phone] (Phone) [phone] (Fax)  Agent: Please be advised that RX refills may take up to 3 business days. We ask that you follow-up with your pharmacy.

## 2018-07-23 ENCOUNTER — Telehealth: Payer: Self-pay | Admitting: *Deleted

## 2018-07-23 NOTE — Telephone Encounter (Signed)
Received Physician Orders from [facility]; forwarded to provider/SLS 10/04

## 2018-07-28 ENCOUNTER — Telehealth: Payer: Self-pay | Admitting: *Deleted

## 2018-07-28 NOTE — Telephone Encounter (Signed)
Received Physician Orders from [facility]; forwarded to provider/SLS 10/09

## 2018-08-03 ENCOUNTER — Encounter: Payer: Self-pay | Admitting: Family Medicine

## 2018-08-03 DIAGNOSIS — Z1231 Encounter for screening mammogram for malignant neoplasm of breast: Secondary | ICD-10-CM | POA: Diagnosis not present

## 2018-08-03 DIAGNOSIS — M8589 Other specified disorders of bone density and structure, multiple sites: Secondary | ICD-10-CM | POA: Diagnosis not present

## 2018-08-03 LAB — HM MAMMOGRAPHY

## 2018-08-09 ENCOUNTER — Telehealth: Payer: Self-pay | Admitting: *Deleted

## 2018-08-09 NOTE — Telephone Encounter (Addendum)
Received Bone Density results from [facility], received pg 1 of 2 [pg 2 missing]; forwarded to provider/SLS 10/21

## 2018-08-10 ENCOUNTER — Telehealth: Payer: Self-pay | Admitting: *Deleted

## 2018-08-10 NOTE — Telephone Encounter (Signed)
Received Bone Density results from [facility]; forwarded to provider/SLS 10/22

## 2018-08-13 ENCOUNTER — Encounter: Payer: Self-pay | Admitting: Family Medicine

## 2018-10-25 ENCOUNTER — Other Ambulatory Visit (INDEPENDENT_AMBULATORY_CARE_PROVIDER_SITE_OTHER): Payer: Medicare HMO

## 2018-10-25 DIAGNOSIS — E782 Mixed hyperlipidemia: Secondary | ICD-10-CM | POA: Diagnosis not present

## 2018-10-25 DIAGNOSIS — I10 Essential (primary) hypertension: Secondary | ICD-10-CM

## 2018-10-25 DIAGNOSIS — R739 Hyperglycemia, unspecified: Secondary | ICD-10-CM | POA: Diagnosis not present

## 2018-10-25 LAB — COMPREHENSIVE METABOLIC PANEL
ALBUMIN: 4.3 g/dL (ref 3.5–5.2)
ALK PHOS: 78 U/L (ref 39–117)
ALT: 10 U/L (ref 0–35)
AST: 16 U/L (ref 0–37)
BILIRUBIN TOTAL: 0.7 mg/dL (ref 0.2–1.2)
BUN: 19 mg/dL (ref 6–23)
CO2: 28 mEq/L (ref 19–32)
Calcium: 9.9 mg/dL (ref 8.4–10.5)
Chloride: 102 mEq/L (ref 96–112)
Creatinine, Ser: 0.94 mg/dL (ref 0.40–1.20)
GFR: 62.69 mL/min (ref >60.00)
GLUCOSE: 116 mg/dL — AB (ref 70–99)
Potassium: 5.4 mEq/L — ABNORMAL HIGH (ref 3.5–5.1)
Sodium: 137 mEq/L (ref 135–145)
TOTAL PROTEIN: 7.1 g/dL (ref 6.0–8.3)

## 2018-10-25 LAB — LIPID PANEL
CHOLESTEROL: 187 mg/dL (ref 0–200)
HDL: 41.7 mg/dL (ref >39.00)
LDL Cholesterol: 119 mg/dL — ABNORMAL HIGH (ref 0–99)
NONHDL: 145.39
Total CHOL/HDL Ratio: 4
Triglycerides: 133 mg/dL (ref 0.0–149.0)
VLDL: 26.6 mg/dL (ref 0.0–40.0)

## 2018-10-25 LAB — CBC
HCT: 40.1 % (ref 36.0–46.0)
Hemoglobin: 13.5 g/dL (ref 12.0–15.0)
MCHC: 33.7 g/dL (ref 30.0–36.0)
MCV: 81.9 fl (ref 78.0–100.0)
PLATELETS: 172 10*3/uL (ref 150.0–400.0)
RBC: 4.9 Mil/uL (ref 3.87–5.11)
RDW: 13.4 % (ref 11.5–15.5)
WBC: 6.8 10*3/uL (ref 4.0–10.5)

## 2018-10-25 LAB — TSH: TSH: 0.93 u[IU]/mL (ref 0.35–4.50)

## 2018-10-25 LAB — HEMOGLOBIN A1C: HEMOGLOBIN A1C: 5.6 % (ref 4.6–6.5)

## 2018-10-29 ENCOUNTER — Ambulatory Visit (INDEPENDENT_AMBULATORY_CARE_PROVIDER_SITE_OTHER): Payer: Medicare HMO | Admitting: Family Medicine

## 2018-10-29 ENCOUNTER — Other Ambulatory Visit (HOSPITAL_COMMUNITY)
Admission: RE | Admit: 2018-10-29 | Discharge: 2018-10-29 | Disposition: A | Discharge status: Home / Self Care | Payer: Medicare HMO | Source: Ambulatory Visit | Attending: Family Medicine | Admitting: Family Medicine

## 2018-10-29 ENCOUNTER — Encounter: Payer: Self-pay | Admitting: Family Medicine

## 2018-10-29 VITALS — BP 164/79 | HR 65 | Temp 97.6°F | Resp 18 | Ht 64.0 in | Wt 137.4 lb

## 2018-10-29 DIAGNOSIS — J0101 Acute recurrent maxillary sinusitis: Secondary | ICD-10-CM | POA: Diagnosis not present

## 2018-10-29 DIAGNOSIS — E875 Hyperkalemia: Secondary | ICD-10-CM | POA: Diagnosis not present

## 2018-10-29 DIAGNOSIS — Z0000 Encounter for general adult medical examination without abnormal findings: Secondary | ICD-10-CM | POA: Diagnosis not present

## 2018-10-29 DIAGNOSIS — J3489 Other specified disorders of nose and nasal sinuses: Secondary | ICD-10-CM | POA: Diagnosis not present

## 2018-10-29 DIAGNOSIS — M8580 Other specified disorders of bone density and structure, unspecified site: Secondary | ICD-10-CM

## 2018-10-29 DIAGNOSIS — R739 Hyperglycemia, unspecified: Secondary | ICD-10-CM

## 2018-10-29 DIAGNOSIS — R0981 Nasal congestion: Secondary | ICD-10-CM

## 2018-10-29 DIAGNOSIS — I10 Essential (primary) hypertension: Secondary | ICD-10-CM | POA: Diagnosis not present

## 2018-10-29 DIAGNOSIS — R69 Illness, unspecified: Secondary | ICD-10-CM | POA: Diagnosis not present

## 2018-10-29 DIAGNOSIS — Z124 Encounter for screening for malignant neoplasm of cervix: Secondary | ICD-10-CM

## 2018-10-29 DIAGNOSIS — F17200 Nicotine dependence, unspecified, uncomplicated: Secondary | ICD-10-CM

## 2018-10-29 LAB — COMPREHENSIVE METABOLIC PANEL
ALBUMIN: 4.5 g/dL (ref 3.5–5.2)
ALT: 11 U/L (ref 0–35)
AST: 17 U/L (ref 0–37)
Alkaline Phosphatase: 86 U/L (ref 39–117)
BUN: 19 mg/dL (ref 6–23)
CHLORIDE: 102 meq/L (ref 96–112)
CO2: 30 mEq/L (ref 19–32)
Calcium: 10.1 mg/dL (ref 8.4–10.5)
Creatinine, Ser: 0.84 mg/dL (ref 0.40–1.20)
GFR: 71.37 mL/min (ref >60.00)
Glucose, Bld: 87 mg/dL (ref 70–99)
POTASSIUM: 5.4 meq/L — AB (ref 3.5–5.1)
SODIUM: 138 meq/L (ref 135–145)
Total Bilirubin: 0.5 mg/dL (ref 0.2–1.2)
Total Protein: 7.5 g/dL (ref 6.0–8.3)

## 2018-10-29 MED ORDER — DOXYCYCLINE HYCLATE 100 MG PO TABS
100.0000 mg | ORAL_TABLET | Freq: Two times a day (BID) | ORAL | 0 refills | Status: DC
Start: 2018-10-29 — End: 2018-11-12

## 2018-10-29 NOTE — Assessment & Plan Note (Signed)
Has been mildly elevated over past couple of days has been struggling with some degree of congestion and PND off and on since Thanksgiving.

## 2018-10-29 NOTE — Assessment & Plan Note (Signed)
Patient encouraged to maintain heart healthy diet, regular exercise, adequate sleep. Consider daily probiotics. Take medications as prescribed. Continues to decline referral for colonoscopy, immunizations. Given and reviewed copy of ACP documents from [employer] and encouraged to complete and return

## 2018-10-29 NOTE — Assessment & Plan Note (Signed)
hgba1c acceptable, minimize simple carbs. Increase exercise as tolerated.  

## 2018-10-29 NOTE — Assessment & Plan Note (Signed)
Doxycycline 100 mg po bid

## 2018-10-29 NOTE — Assessment & Plan Note (Signed)
Encouraged to get adequate exercise, calcium and vitamin d intake. Quit smoking

## 2018-10-29 NOTE — Patient Instructions (Addendum)
Unclear allergic vs infectious. Continue Cetirizine daily can increase to bid as needed. Consider Flonase and nasal saline. Encouraged increased rest and hydration, add probiotics, zinc such as Coldeze or Xicam. Treat fevers as needed, elderberry, Vitamin C 500 to 1000 mg , Mucinex twice daily  Shingrix is the new shot 2 shots over 2-6 months. At pharmacy Prevnar and Pneumovax are the two shots for pneumonia. Preventive Care 53 Years and Older, Female Preventive care refers to lifestyle choices and visits with your health care provider that can promote health and wellness. What does preventive care include?  A yearly physical exam. This is also called an annual well check.  Dental exams once or twice a year.  Routine eye exams. Ask your health care provider how often you should have your eyes checked.  Personal lifestyle choices, including: ? Daily care of your teeth and gums. ? Regular physical activity. ? Eating a healthy diet. ? Avoiding tobacco and drug use. ? Limiting alcohol use. ? Practicing safe sex. ? Taking low-dose aspirin every day. ? Taking vitamin and mineral supplements as recommended by your health care provider. What happens during an annual well check? The services and screenings done by your health care provider during your annual well check will depend on your age, overall health, lifestyle risk factors, and family history of disease. Counseling Your health care provider may ask you questions about your:  Alcohol use.  Tobacco use.  Drug use.  Emotional well-being.  Home and relationship well-being.  Sexual activity.  Eating habits.  History of falls.  Memory and ability to understand (cognition).  Work and work Statistician.  Reproductive health.  Screening You may have the following tests or measurements:  Height, weight, and BMI.  Blood pressure.  Lipid and cholesterol levels. These may be checked every 5 years, or more frequently if you  are over 70 years old.  Skin check.  Lung cancer screening. You may have this screening every year starting at age 45 if you have a 30-pack-year history of smoking and currently smoke or have quit within the past 15 years.  Colorectal cancer screening. All adults should have this screening starting at age 42 and continuing until age 58. You will have tests every 1-10 years, depending on your results and the type of screening test. People at increased risk should start screening at an earlier age. Screening tests may include: ? Guaiac-based fecal occult blood testing. ? Fecal immunochemical test (FIT). ? Stool DNA test. ? Virtual colonoscopy. ? Sigmoidoscopy. During this test, a flexible tube with a tiny camera (sigmoidoscope) is used to examine your rectum and lower colon. The sigmoidoscope is inserted through your anus into your rectum and lower colon. ? Colonoscopy. During this test, a long, thin, flexible tube with a tiny camera (colonoscope) is used to examine your entire colon and rectum.  Hepatitis C blood test.  Hepatitis B blood test.  Sexually transmitted disease (STD) testing.  Diabetes screening. This is done by checking your blood sugar (glucose) after you have not eaten for a while (fasting). You may have this done every 1-3 years.  Bone density scan. This is done to screen for osteoporosis. You may have this done starting at age 60.  Mammogram. This may be done every 1-2 years. Talk to your health care provider about how often you should have regular mammograms. Talk with your health care provider about your test results, treatment options, and if necessary, the need for more tests. Vaccines Your health care  provider may recommend certain vaccines, such as:  Influenza vaccine. This is recommended every year.  Tetanus, diphtheria, and acellular pertussis (Tdap, Td) vaccine. You may need a Td booster every 10 years.  Varicella vaccine. You may need this if you have not  been vaccinated.  Zoster vaccine. You may need this after age 59.  Measles, mumps, and rubella (MMR) vaccine. You may need at least one dose of MMR if you were born in 1957 or later. You may also need a second dose.  Pneumococcal 13-valent conjugate (PCV13) vaccine. One dose is recommended after age 12.  Pneumococcal polysaccharide (PPSV23) vaccine. One dose is recommended after age 61.  Meningococcal vaccine. You may need this if you have certain conditions.  Hepatitis A vaccine. You may need this if you have certain conditions or if you travel or work in places where you may be exposed to hepatitis A.  Hepatitis B vaccine. You may need this if you have certain conditions or if you travel or work in places where you may be exposed to hepatitis B.  Haemophilus influenzae type b (Hib) vaccine. You may need this if you have certain conditions. Talk to your health care provider about which screenings and vaccines you need and how often you need them. This information is not intended to replace advice given to you by your health care provider. Make sure you discuss any questions you have with your health care provider. Document Released: 11/02/2015 Document Revised: 11/26/2017 Document Reviewed: 08/07/2015 Elsevier Interactive Patient Education  2019 [employer].

## 2018-10-29 NOTE — Progress Notes (Signed)
Subjective:    Patient ID: Cynthia LintsCheryl Hubbard, female    DOB: 09-Mar-1951, 68 y.o.   MRN: [id number]  No chief complaint on file.   HPI Patient is in today for follow up and annual preventative exam. No recent febrile illness or hospitalizations. No polyuria or polydipsia. Notes feeling jittery when she does not eat.  Past Medical History:  Diagnosis Date  . Chicken pox 68 yrs old  . H/O measles    3 day measles   . History of chicken pox   . HTN (hypertension) 03/16/2014  . Hyperlipidemia    weighed 30 pounds heavier- used to take crestor  . Hypertension    was 30 pounds heavier  . Measles 6 th grade   3 day measles  . Osteopenia 09/14/2015  . Other and unspecified hyperlipidemia 03/16/2014  . Retinal hemorrhage of right eye 03/16/2014  . Tobacco abuse disorder 03/16/2014    Past Surgical History:  Procedure Laterality Date  . REFRACTIVE SURGERY Right     Family History  Adopted: Yes    Social History   Socioeconomic History  . Marital status: Married    Spouse name: Not on file  . Number of children: Not on file  . Years of education: Not on file  . Highest education level: Not on file  Occupational History  . Not on file  Social Needs  . Financial resource strain: Not on file  . Food insecurity:    Worry: Not on file    Inability: Not on file  . Transportation needs:    Medical: Not on file    Non-medical: Not on file  Tobacco Use  . Smoking status: Current Every Day Smoker    Packs/day: 0.75    Years: 40.00    Pack years: 30.00    Types: Cigarettes  . Smokeless tobacco: Never Used  . Tobacco comment: pt declines cessation material  Substance and Sexual Activity  . Alcohol use: Yes    Comment: very seldom  . Drug use: No  . Sexual activity: Yes    Comment: lives with husband, no dietary restrictions, works part time at a furniture store  Lifestyle  . Physical activity:    Days per week: Not on file    Minutes per session: Not on file  . Stress: Not  on file  Relationships  . Social connections:    Talks on phone: Not on file    Gets together: Not on file    Attends religious service: Not on file    Active member of club or organization: Not on file    Attends meetings of clubs or organizations: Not on file    Relationship status: Not on file  . Intimate partner violence:    Fear of current or ex partner: Not on file    Emotionally abused: Not on file    Physically abused: Not on file    Forced sexual activity: Not on file  Other Topics Concern  . Not on file  Social History Narrative  . Not on file    Outpatient Medications Prior to Visit  Medication Sig Dispense Refill  . Cholecalciferol (VITAMIN D3) 5000 UNITS CAPS Take 1 capsule by mouth daily.    . DENTA 5000 PLUS 1.1 % CREA dental cream USE AS DIRECTED TWO TIMES DAILY  3  . lisinopril (PRINIVIL,ZESTRIL) 5 MG tablet Take 1 tablet (5 mg total) by mouth 2 (two) times daily. 180 tablet 1  . OVER THE COUNTER MEDICATION  Mega red daily    . Probiotic Product (PROBIOTIC DAILY PO) Take by mouth daily.    . simvastatin (ZOCOR) 10 MG tablet Take 1 tablet (10 mg total) by mouth daily. 90 tablet 1   No facility-administered medications prior to visit.     No Known Allergies  Review of Systems  Constitutional: Negative for chills, fever and malaise/fatigue.  HENT: Negative for congestion and hearing loss.   Eyes: Negative for discharge.  Respiratory: Negative for cough, sputum production and shortness of breath.   Cardiovascular: Negative for chest pain, palpitations and leg swelling.  Gastrointestinal: Negative for abdominal pain, blood in stool, constipation, diarrhea, heartburn, nausea and vomiting.  Genitourinary: Negative for dysuria, frequency, hematuria and urgency.  Musculoskeletal: Negative for back pain, falls and myalgias.  Skin: Negative for rash.  Neurological: Negative for dizziness, sensory change, loss of consciousness, weakness and headaches.    Endo/Heme/Allergies: Negative for environmental allergies. Does not bruise/bleed easily.  Psychiatric/Behavioral: Negative for depression and suicidal ideas. The patient is not nervous/anxious and does not have insomnia.        Objective:    Physical Exam Constitutional:      General: She is not in acute distress.    Appearance: She is well-developed.  HENT:     Head: Normocephalic and atraumatic.  Eyes:     Conjunctiva/sclera: Conjunctivae normal.  Neck:     Musculoskeletal: Neck supple.     Thyroid: No thyromegaly.  Cardiovascular:     Rate and Rhythm: Normal rate and regular rhythm.     Heart sounds: Normal heart sounds. No murmur.  Pulmonary:     Effort: Pulmonary effort is normal. No respiratory distress.     Breath sounds: Normal breath sounds.  Abdominal:     General: Bowel sounds are normal. There is no distension.     Palpations: Abdomen is soft. There is no mass.     Tenderness: There is no abdominal tenderness.  Lymphadenopathy:     Cervical: No cervical adenopathy.  Skin:    General: Skin is warm and dry.  Neurological:     Mental Status: She is alert and oriented to person, place, and time.  Psychiatric:        Behavior: Behavior normal.     BP (!) 164/79 (BP Location: Left Arm, Patient Position: Sitting, Cuff Size: Normal)   Pulse 65   Temp 97.6 F (36.4 C) (Oral)   Resp 18   Ht 5\' 4"  (1.626 m)   Wt 137 lb 6.4 oz (62.3 kg)   SpO2 99%   BMI 23.58 kg/m  Wt Readings from Last 3 Encounters:  10/29/18 137 lb 6.4 oz (62.3 kg)  04/26/18 135 lb (61.2 kg)  04/26/18 135 lb 9.6 oz (61.5 kg)     Lab Results  Component Value Date   WBC 6.8 10/25/2018   HGB 13.5 10/25/2018   HCT 40.1 10/25/2018   PLT 172.0 10/25/2018   GLUCOSE 116 (H) 10/25/2018   CHOL 187 10/25/2018   TRIG 133.0 10/25/2018   HDL 41.70 10/25/2018   LDLCALC 119 (H) 10/25/2018   ALT 10 10/25/2018   AST 16 10/25/2018   NA 137 10/25/2018   K 5.4 (H) 10/25/2018   CL 102 10/25/2018    CREATININE 0.94 10/25/2018   BUN 19 10/25/2018   CO2 28 10/25/2018   TSH 0.93 10/25/2018   HGBA1C 5.6 10/25/2018    Lab Results  Component Value Date   TSH 0.93 10/25/2018   Lab Results  Component Value Date   WBC 6.8 10/25/2018   HGB 13.5 10/25/2018   HCT 40.1 10/25/2018   MCV 81.9 10/25/2018   PLT 172.0 10/25/2018   Lab Results  Component Value Date   NA 137 10/25/2018   K 5.4 (H) 10/25/2018   CO2 28 10/25/2018   GLUCOSE 116 (H) 10/25/2018   BUN 19 10/25/2018   CREATININE 0.94 10/25/2018   BILITOT 0.7 10/25/2018   ALKPHOS 78 10/25/2018   AST 16 10/25/2018   ALT 10 10/25/2018   PROT 7.1 10/25/2018   ALBUMIN 4.3 10/25/2018   CALCIUM 9.9 10/25/2018   GFR 62.69 10/25/2018   Lab Results  Component Value Date   CHOL 187 10/25/2018   Lab Results  Component Value Date   HDL 41.70 10/25/2018   Lab Results  Component Value Date   LDLCALC 119 (H) 10/25/2018   Lab Results  Component Value Date   TRIG 133.0 10/25/2018   Lab Results  Component Value Date   CHOLHDL 4 10/25/2018   Lab Results  Component Value Date   HGBA1C 5.6 10/25/2018       Assessment & Plan:   Problem List Items Addressed This Visit    HTN (hypertension)    Has been mildly elevated over past couple of days has been struggling with some degree of congestion and PND off and on since Thanksgiving.       Tobacco use disorder    Has cut down from nearly a PPD to only 5 cigarettes yesterday and she has cut down on her caffeine       Preventative health care    Patient encouraged to maintain heart healthy diet, regular exercise, adequate sleep. Consider daily probiotics. Take medications as prescribed. Continues to decline referral for colonoscopy, immunizations. Given and reviewed copy of ACP documents from [facility] Secretary of State and encouraged to complete and return      Nasal congestion with rhinorrhea    Unclear allergic vs infectious. Continue Cetirizine daily can increase to bid as  needed. Consider Flonase and nasal saline. Encouraged increased rest and hydration, add probiotics, zinc such as Coldeze or Xicam. Treat fevers as needed, elderberry, Vitamin C 500 to 1000 mg , Mucinex twice daily      Encounter for screening for cervical cancer     Pap today, no concerns on exam.       Hyperkalemia - Primary    Asymptomatic. Recheck cmp      Relevant Orders   Comprehensive metabolic panel   Hyperglycemia    hgba1c acceptable, minimize simple carbs. Increase exercise as tolerated.          I am having Cynthia Hubbard maintain her OVER THE COUNTER MEDICATION, Vitamin D3, Probiotic Product (PROBIOTIC DAILY PO), DENTA 5000 PLUS, lisinopril, and simvastatin.  No orders of the defined types were placed in this encounter.    Danise Edge, MD

## 2018-10-29 NOTE — Assessment & Plan Note (Signed)
Pap today, no concerns on exam.  

## 2018-10-29 NOTE — Assessment & Plan Note (Signed)
Asymptomatic  Recheck cmp

## 2018-10-29 NOTE — Assessment & Plan Note (Signed)
Has cut down from nearly a PPD to only 5 cigarettes yesterday and she has cut down on her caffeine

## 2018-10-29 NOTE — Assessment & Plan Note (Signed)
Unclear allergic vs infectious. Continue Cetirizine daily can increase to bid as needed. Consider Flonase and nasal saline. Encouraged increased rest and hydration, add probiotics, zinc such as Coldeze or Xicam. Treat fevers as needed, elderberry, Vitamin C 500 to 1000 mg , Mucinex twice daily

## 2018-11-01 LAB — CYTOLOGY - PAP: DIAGNOSIS: NEGATIVE

## 2018-11-02 DIAGNOSIS — R69 Illness, unspecified: Secondary | ICD-10-CM | POA: Diagnosis not present

## 2018-11-12 ENCOUNTER — Telehealth: Payer: Self-pay

## 2018-11-12 ENCOUNTER — Other Ambulatory Visit: Payer: Self-pay | Admitting: Family Medicine

## 2018-11-12 MED ORDER — CEFDINIR 300 MG PO CAPS: 300.0000 mg | ORAL_CAPSULE | Freq: Two times a day (BID) | ORAL | 0 refills | Status: AC

## 2018-11-12 NOTE — Telephone Encounter (Signed)
Copied from CRM [id number]. Topic: General - Inquiry >> Nov 10, 2018 12:15 PM Lynne Logan D wrote: Reason for CRM: Pt wanted to speak with Dr. Abner Greenspan about an issue she is already privy to. Would like to know if she can give her a call. Please advise. CB# [phone]   Dr. Abner Greenspan- Do you know what she could be speaking of or do I need to give her a call to find out more  Please advise

## 2018-11-12 NOTE — Telephone Encounter (Signed)
Spoke with patient she continues to struggle with nasal congestion, facial pressure, rhinorrhea, malaise and fatigue. Doxycycline has been completed it seemed to help initially and then her symptoms recurred again. Cefdinir sent in and Encouraged increased rest and hydration, add probiotics, zinc such as Coldeze or Xicam. Treat fevers as needed. Add nasal saline and flonase.

## 2018-11-19 ENCOUNTER — Ambulatory Visit (INDEPENDENT_AMBULATORY_CARE_PROVIDER_SITE_OTHER): Payer: Medicare HMO | Admitting: Family Medicine

## 2018-11-19 ENCOUNTER — Encounter: Payer: Self-pay | Admitting: *Deleted

## 2018-11-19 VITALS — BP 130/78 | HR 71

## 2018-11-19 DIAGNOSIS — I10 Essential (primary) hypertension: Secondary | ICD-10-CM | POA: Diagnosis not present

## 2018-11-19 NOTE — Progress Notes (Signed)
Patient here today for blood pressure follow up per Dr. Abner Greenspan  Patient is currently taking lisinopril.5mg  bid.  She has taken medication today.  Blood pressure is 130/78 today  Next appointment with Dr. Abner Greenspan is on 05/03/19  Per Dr. Abner Greenspan ok to stay on same dose, let her know if she has any concerns at home, and she will see her at her next visit on 05/03/19.  Sent patient mychart message per patient request due to the wait on the provider.  Nursing blood pressure check note reviewed. Agree with documention and plan.

## 2018-12-15 ENCOUNTER — Other Ambulatory Visit: Payer: Self-pay | Admitting: Family Medicine

## 2019-01-10 ENCOUNTER — Other Ambulatory Visit: Payer: Self-pay | Admitting: Family Medicine

## 2019-01-17 ENCOUNTER — Encounter: Payer: Self-pay | Admitting: Family Medicine

## 2019-04-26 ENCOUNTER — Telehealth: Payer: Self-pay | Admitting: Family Medicine

## 2019-04-26 NOTE — Telephone Encounter (Addendum)
Called pt and AWV and f/u with Dr. Charlett Blake rescheduled to 06/09/2019 for now. Pt asking if she needs to complete any lab work for Dr. Charlett Blake now or prior to 8/20 appt. Please advise pt via mychart.

## 2019-05-02 NOTE — Telephone Encounter (Signed)
Sent message to patient via mychart.

## 2019-05-03 ENCOUNTER — Ambulatory Visit: Payer: Medicare HMO | Admitting: *Deleted

## 2019-05-03 ENCOUNTER — Ambulatory Visit: Payer: Medicare HMO | Admitting: Family Medicine

## 2019-05-11 DIAGNOSIS — R69 Illness, unspecified: Secondary | ICD-10-CM | POA: Diagnosis not present

## 2019-06-09 ENCOUNTER — Ambulatory Visit (INDEPENDENT_AMBULATORY_CARE_PROVIDER_SITE_OTHER): Payer: Medicare HMO | Admitting: *Deleted

## 2019-06-09 ENCOUNTER — Ambulatory Visit (INDEPENDENT_AMBULATORY_CARE_PROVIDER_SITE_OTHER): Payer: Medicare HMO | Admitting: Family Medicine

## 2019-06-09 ENCOUNTER — Other Ambulatory Visit: Payer: Self-pay

## 2019-06-09 ENCOUNTER — Encounter: Payer: Self-pay | Admitting: *Deleted

## 2019-06-09 ENCOUNTER — Encounter: Payer: Self-pay | Admitting: Family Medicine

## 2019-06-09 VITALS — BP 110/70 | HR 57 | Temp 97.5°F | Resp 16 | Ht 64.0 in | Wt 144.4 lb

## 2019-06-09 DIAGNOSIS — E782 Mixed hyperlipidemia: Secondary | ICD-10-CM

## 2019-06-09 DIAGNOSIS — B070 Plantar wart: Secondary | ICD-10-CM | POA: Diagnosis not present

## 2019-06-09 DIAGNOSIS — R739 Hyperglycemia, unspecified: Secondary | ICD-10-CM

## 2019-06-09 DIAGNOSIS — F17200 Nicotine dependence, unspecified, uncomplicated: Secondary | ICD-10-CM | POA: Diagnosis not present

## 2019-06-09 DIAGNOSIS — Z0000 Encounter for general adult medical examination without abnormal findings: Secondary | ICD-10-CM

## 2019-06-09 DIAGNOSIS — I10 Essential (primary) hypertension: Secondary | ICD-10-CM | POA: Diagnosis not present

## 2019-06-09 DIAGNOSIS — B079 Viral wart, unspecified: Secondary | ICD-10-CM | POA: Diagnosis not present

## 2019-06-09 DIAGNOSIS — M25552 Pain in left hip: Secondary | ICD-10-CM | POA: Diagnosis not present

## 2019-06-09 DIAGNOSIS — R69 Illness, unspecified: Secondary | ICD-10-CM | POA: Diagnosis not present

## 2019-06-09 LAB — COMPREHENSIVE METABOLIC PANEL
ALT: 12 U/L (ref 0–35)
AST: 18 U/L (ref 0–37)
Albumin: 4.7 g/dL (ref 3.5–5.2)
Alkaline Phosphatase: 90 U/L (ref 39–117)
BUN: 13 mg/dL (ref 6–23)
CO2: 27 mEq/L (ref 19–32)
Calcium: 9.7 mg/dL (ref 8.4–10.5)
Chloride: 101 mEq/L (ref 96–112)
Creatinine, Ser: 0.81 mg/dL (ref 0.40–1.20)
GFR: 69.91 mL/min (ref >60.00)
Glucose, Bld: 92 mg/dL (ref 70–99)
Potassium: 4.5 mEq/L (ref 3.5–5.1)
Sodium: 136 mEq/L (ref 135–145)
Total Bilirubin: 0.5 mg/dL (ref 0.2–1.2)
Total Protein: 7.6 g/dL (ref 6.0–8.3)

## 2019-06-09 LAB — CBC
HCT: 41.3 % (ref 36.0–46.0)
Hemoglobin: 13.8 g/dL (ref 12.0–15.0)
MCHC: 33.5 g/dL (ref 30.0–36.0)
MCV: 83 fl (ref 78.0–100.0)
Platelets: 178 10*3/uL (ref 150.0–400.0)
RBC: 4.98 Mil/uL (ref 3.87–5.11)
RDW: 13.2 % (ref 11.5–15.5)
WBC: 7 10*3/uL (ref 4.0–10.5)

## 2019-06-09 LAB — LIPID PANEL
Cholesterol: 195 mg/dL (ref 0–200)
HDL: 39 mg/dL — ABNORMAL LOW (ref >39.00)
LDL Cholesterol: 129 mg/dL — ABNORMAL HIGH (ref 0–99)
NonHDL: 156.3
Total CHOL/HDL Ratio: 5
Triglycerides: 138 mg/dL (ref 0.0–149.0)
VLDL: 27.6 mg/dL (ref 0.0–40.0)

## 2019-06-09 LAB — TSH: TSH: 0.69 u[IU]/mL (ref 0.35–4.50)

## 2019-06-09 LAB — HEMOGLOBIN A1C: Hgb A1c MFr Bld: 5.8 % (ref 4.6–6.5)

## 2019-06-09 NOTE — Assessment & Plan Note (Signed)
Well controlled, no changes to meds. Encouraged heart healthy diet such as the DASH diet and exercise as tolerated.  °

## 2019-06-09 NOTE — Assessment & Plan Note (Signed)
Encouraged heart healthy diet, increase exercise, avoid trans fats and carb counting.

## 2019-06-09 NOTE — Assessment & Plan Note (Signed)
hgba1c acceptable, minimize simple carbs. Increase exercise as tolerated.  

## 2019-06-09 NOTE — Patient Instructions (Signed)
Please schedule your next medicare wellness visit with me in 1 yr.  Continue to eat heart healthy diet (full of fruits, vegetables, whole grains, lean protein, water--limit salt, fat, and sugar intake) and increase physical activity as tolerated.  Continue doing brain stimulating activities (puzzles, reading, adult coloring books, staying active) to keep memory sharp.   Bring a copy of your living will and/or healthcare power of attorney to your next office visit.   Cynthia Hubbard , Thank you for taking time to come for your Medicare Wellness Visit. I appreciate your ongoing commitment to your health goals. Please review the following plan we discussed and let me know if I can assist you in the future.   These are the goals we discussed: Goals    . DIET - INCREASE WATER INTAKE     Cut back on Pepsi and drink more water.    . Eat healthier and lose 10lbs  (pt-stated)    . Increase physical activity     Yoga and walking in park.      . Quit Smoking    . Weight (lb) < 130 lb (59 kg)       This is a list of the screening recommended for you and due dates:  Health Maintenance  Topic Date Due  .  Hepatitis C: One time screening is recommended by Center for Disease Control  (CDC) for  adults born from [birth year] through 1967.   04/28/1951  . Tetanus Vaccine  06/12/1968  . Colon Cancer Screening  06/13/1999  . Pneumonia vaccines (1 of 2 - PCV13) 06/12/2014  . Flu Shot  05/21/2019  . Mammogram  08/03/2020  . DEXA scan (bone density measurement)  Completed    Health Maintenance After Age 70 After age 565, you are at a higher risk for certain long-term diseases and infections as well as injuries from falls. Falls are a major cause of broken bones and head injuries in people who are older than age 70. Getting regular preventive care can help to keep you healthy and well. Preventive care includes getting regular testing and making lifestyle changes as recommended by your health care provider. Talk  with your health care provider about:  Which screenings and tests you should have. A screening is a test that checks for a disease when you have no symptoms.  A diet and exercise plan that is right for you. What should I know about screenings and tests to prevent falls? Screening and testing are the best ways to find a health problem early. Early diagnosis and treatment give you the best chance of managing medical conditions that are common after age 70. Certain conditions and lifestyle choices may make you more likely to have a fall. Your health care provider may recommend:  Regular vision checks. Poor vision and conditions such as cataracts can make you more likely to have a fall. If you wear glasses, make sure to get your prescription updated if your vision changes.  Medicine review. Work with your health care provider to regularly review all of the medicines you are taking, including over-the-counter medicines. Ask your health care provider about any side effects that may make you more likely to have a fall. Tell your health care provider if any medicines that you take make you feel dizzy or sleepy.  Osteoporosis screening. Osteoporosis is a condition that causes the bones to get weaker. This can make the bones weak and cause them to break more easily.  Blood pressure screening. Blood  pressure changes and medicines to control blood pressure can make you feel dizzy.  Strength and balance checks. Your health care provider may recommend certain tests to check your strength and balance while standing, walking, or changing positions.  Foot health exam. Foot pain and numbness, as well as not wearing proper footwear, can make you more likely to have a fall.  Depression screening. You may be more likely to have a fall if you have a fear of falling, feel emotionally low, or feel unable to do activities that you used to do.  Alcohol use screening. Using too much alcohol can affect your balance and  may make you more likely to have a fall. What actions can I take to lower my risk of falls? General instructions  Talk with your health care provider about your risks for falling. Tell your health care provider if: ? You fall. Be sure to tell your health care provider about all falls, even ones that seem minor. ? You feel dizzy, sleepy, or off-balance.  Take over-the-counter and prescription medicines only as told by your health care provider. These include any supplements.  Eat a healthy diet and maintain a healthy weight. A healthy diet includes low-fat dairy products, low-fat (lean) meats, and fiber from whole grains, beans, and lots of fruits and vegetables. Home safety  Remove any tripping hazards, such as rugs, cords, and clutter.  Install safety equipment such as grab bars in bathrooms and safety rails on stairs.  Keep rooms and walkways well-lit. Activity   Follow a regular exercise program to stay fit. This will help you maintain your balance. Ask your health care provider what types of exercise are appropriate for you.  If you need a cane or walker, use it as recommended by your health care provider.  Wear supportive shoes that have nonskid soles. Lifestyle  Do not drink alcohol if your health care provider tells you not to drink.  If you drink alcohol, limit how much you have: ? 0-1 drink a day for women. ? 0-2 drinks a day for men.  Be aware of how much alcohol is in your drink. In the [place] one drink equals one typical bottle of beer (12 oz), one-half glass of wine (5 oz), or one shot of hard liquor (1 oz).  Do not use any products that contain nicotine or tobacco, such as cigarettes and e-cigarettes. If you need help quitting, ask your health care provider. Summary  Having a healthy lifestyle and getting preventive care can help to protect your health and wellness after age 69.  Screening and testing are the best way to find a health problem early and help you  avoid having a fall. Early diagnosis and treatment give you the best chance for managing medical conditions that are more common for people who are older than age 31.  Falls are a major cause of broken bones and head injuries in people who are older than age 37. Take precautions to prevent a fall at home.  Work with your health care provider to learn what changes you can make to improve your health and wellness and to prevent falls. This information is not intended to replace advice given to you by your health care provider. Make sure you discuss any questions you have with your health care provider. Document Released: 08/19/2017 Document Revised: 01/27/2019 Document Reviewed: 08/19/2017 Elsevier Patient Education  2020 [employer].

## 2019-06-09 NOTE — Patient Instructions (Addendum)
Lidocaine patch to hip  Start Folic Acid 1 mg daily  Vitamin C 500 mg twice daily  Zinc 30-50 mg daily  Pulse oximeter   Check vitals weekly  Plantar Warts Plantar warts are small growths on the bottom of the foot (sole). Warts are caused by a type of germ (virus). Most warts are not painful, and they usually do not cause problems. Sometimes, plantar warts can cause pain when you walk. Warts often go away on their own in time. They can also spread to other areas of the body. Treatments may be done if needed. What are the causes?  Plantar warts are caused by a germ that is called human papillomavirus (HPV). ? Walking barefoot can cause exposure to the germ, especially if your feet are wet. ? Warts happen when HPV attacks a break in the skin of the foot. What increases the risk?  Being between 8510-70 years of age.  Using public showers or locker rooms.  Having a weakened body defense system (immune system). What are the signs or symptoms?   Flat or slightly raised growths that have a rough surface and look like a callus.  Pain when you use your foot to support your body weight. How is this treated? In many cases, warts do not need treatment. Without treatment, they often go away with time. If treatment is needed or wanted, options may include:  Applying medicated solutions, creams, or patches to the wart. These make the skin soft so that layers will slowly shed away.  Freezing the wart with liquid nitrogen (cryotherapy).  Burning the wart with: ? Laser treatment. ? An electrified probe (electrocautery).  Injecting a medicine (Candida antigen) into the wart to help the body's defense system fight off the wart.  Having surgery to remove the wart.  Putting duct tape over the top of the wart (occlusion). You will leave the tape in place for as long as told by your doctor. Then you will replace it with a new strip of tape. This is done until the wart goes away. Repeat treatment  may be needed if you choose to remove warts. Warts sometimes go away and come back again. Follow these instructions at home: General instructions  Apply creams or solutions only as told by your doctor. Follow these steps if your doctor tells you to do so: ? Soak your foot in warm water. ? Remove the top layer of softened skin before you apply the medicine. You can use a pumice stone to remove the skin. ? After you apply the medicine, put a bandage over the area of the wart. ? Repeat the process every day or as told by your doctor.  Do not scratch or pick at a wart.  Wash your hands after you touch a wart.  If a wart hurts, try covering it with a bandage that has a hole in the middle.  Keep all follow-up visits as told by your doctor. This is important. How is this prevented?   Wear shoes and socks. Change your socks every day.  Keep your feet clean and dry.  Check your feet often.  Do not walk barefoot in: ? Shared locker rooms. ? Shower areas. ? Swimming pools.  Avoid direct contact with warts on other people. Contact a doctor if:  Your warts do not improve after treatment.  You have redness, swelling, or pain at the site of a wart.  You have bleeding from a wart, and the bleeding does not stop when you  put light pressure on the wart.  You have diabetes and you get a wart. Summary  Warts are small growths on the skin.  When warts happen on the bottom of the foot (sole), they are called plantar warts.  In many cases, warts do not need treatment.  Apply creams or solutions only as told by your doctor.  Do not scratch or pick at a wart. Wash your hands after you touch a wart. This information is not intended to replace advice given to you by your health care provider. Make sure you discuss any questions you have with your health care provider. Document Released: 11/08/2010 Document Revised: 07/15/2018 Document Reviewed: 07/15/2018 Elsevier Patient Education  2020  [employer].   Hip Pain  The hip is the joint between the upper legs and the lower pelvis. The bones, cartilage, tendons, and muscles of your hip joint support your body and allow you to move around. Hip pain can range from a minor ache to severe pain in one or both of your hips. The pain may be felt on the inside of the hip joint near the groin, or the outside near the buttocks and upper thigh. You may also have swelling or stiffness. Follow these instructions at home: Managing pain, stiffness, and swelling  If directed, apply ice to the injured area. ? Put ice in a plastic bag. ? Place a towel between your skin and the bag. ? Leave the ice on for 20 minutes, 2-3 times a day  Sleep with a pillow between your legs on your most comfortable side.  Avoid any activities that cause pain. General instructions  Take over-the-counter and prescription medicines only as told by your health care provider.  Do any exercises as told by your health care provider.  Record the following: ? How often you have hip pain. ? The location of your pain. ? What the pain feels like. ? What makes the pain worse.  Keep all follow-up visits as told by your health care provider. This is important. Contact a health care provider if:  You cannot put weight on your leg.  Your pain or swelling continues or gets worse after one week.  It gets harder to walk.  You have a fever. Get help right away if:  You fall.  You have a sudden increase in pain and swelling in your hip.  Your hip is red or swollen or very tender to touch. Summary  Hip pain can range from a minor ache to severe pain in one or both of your hips.  The pain may be felt on the inside of the hip joint near the groin, or the outside near the buttocks and upper thigh.  Avoid any activities that cause pain.  Record how often you have hip pain, the location of the pain, what makes it worse and what it feels like. This information is  not intended to replace advice given to you by your health care provider. Make sure you discuss any questions you have with your health care provider. Document Released: 03/26/2010 Document Revised: 09/18/2017 Document Reviewed: 09/08/2016 Elsevier Patient Education  2020 [employer].

## 2019-06-09 NOTE — Assessment & Plan Note (Signed)
Encouraged complete cessation. Discussed need to quit as relates to risk of numerous cancers, cardiac and pulmonary disease as well as neurologic complications. Counseled for greater than 3 minutes 

## 2019-06-09 NOTE — Progress Notes (Signed)
Virtual Visit via Video Note  I connected with patient on 06/09/19 at 10:00 AM EDT by audio enabled telemedicine application and verified that I am speaking with the correct person using two identifiers.   THIS ENCOUNTER IS A VIRTUAL VISIT DUE TO COVID-19 - PATIENT WAS NOT SEEN IN THE OFFICE. PATIENT HAS CONSENTED TO VIRTUAL VISIT / TELEMEDICINE VISIT   Location of patient: home  Location of provider: office  I discussed the limitations of evaluation and management by telemedicine and the availability of in person appointments. The patient expressed understanding and agreed to proceed.   Subjective:   Cynthia Hubbard is a 68 y.o. female who presents for Medicare Annual (Subsequent) preventive examination.  Review of Systems:  Home Safety/Smoke Alarms: Feels safe in home. Smoke alarms in place.  Lives with husband in 2 story home. Does okay with stairs.  Female:       Mammo- 08/03/18      Dexa scan- 08/03/18       CCS- declines     Objective:    Advanced Directives 06/09/2019 04/26/2018 03/10/2017 06/21/2015 06/22/2014 06/22/2014  Does Patient Have a Medical Advance Directive? Yes No No No No No  Type of Estate agentAdvance Directive Healthcare Power of [place];Living will - - - - -  Does patient want to make changes to medical advance directive? No - Patient declined - - - - -  Copy of Healthcare Power of Attorney in Chart? No - copy requested - - - - -  Would patient like information on creating a medical advance directive? - Yes (MAU/Ambulatory/Procedural Areas - Information given) No - Patient declined No - patient declined information Yes - Educational materials given -    Tobacco Social History   Tobacco Use  Smoking Status Current Every Day Smoker  . Packs/day: 0.75  . Years: 40.00  . Pack years: 30.00  . Types: Cigarettes  Smokeless Tobacco Never Used  Tobacco Comment   pt declines cessation material     Ready to quit: No Counseling given: No Comment: pt declines cessation  material   Clinical Intake:     Pain : No/denies pain                 Past Medical History:  Diagnosis Date  . Chicken pox 68 yrs old  . H/O measles    3 day measles   . History of chicken pox   . HTN (hypertension) 03/16/2014  . Hyperlipidemia    weighed 30 pounds heavier- used to take crestor  . Hypertension    was 30 pounds heavier  . Measles 6 th grade   3 day measles  . Osteopenia 09/14/2015  . Other and unspecified hyperlipidemia 03/16/2014  . Retinal hemorrhage of right eye 03/16/2014  . Tobacco abuse disorder 03/16/2014   Past Surgical History:  Procedure Laterality Date  . REFRACTIVE SURGERY Right    Family History  Adopted: Yes   Social History   Socioeconomic History  . Marital status: Married    Spouse name: Not on file  . Number of children: Not on file  . Years of education: Not on file  . Highest education level: Not on file  Occupational History  . Not on file  Social Needs  . Financial resource strain: Not on file  . Food insecurity    Worry: Not on file    Inability: Not on file  . Transportation needs    Medical: Not on file    Non-medical: Not on file  Tobacco Use  . Smoking status: Current Every Day Smoker    Packs/day: 0.75    Years: 40.00    Pack years: 30.00    Types: Cigarettes  . Smokeless tobacco: Never Used  . Tobacco comment: pt declines cessation material  Substance and Sexual Activity  . Alcohol use: Yes    Comment: very seldom  . Drug use: No  . Sexual activity: Yes    Comment: lives with husband, no dietary restrictions, works part time at a furniture store  Lifestyle  . Physical activity    Days per week: Not on file    Minutes per session: Not on file  . Stress: Not on file  Relationships  . Social Musicianconnections    Talks on phone: Not on file    Gets together: Not on file    Attends religious service: Not on file    Active member of club or organization: Not on file    Attends meetings of clubs or  organizations: Not on file    Relationship status: Not on file  Other Topics Concern  . Not on file  Social History Narrative  . Not on file    Outpatient Encounter Medications as of 06/09/2019  Medication Sig  . Cholecalciferol (VITAMIN D3) 5000 UNITS CAPS Take 1 capsule by mouth daily.  Marland Kitchen. lisinopril (PRINIVIL,ZESTRIL) 5 MG tablet TAKE 1 TABLET BY MOUTH TWICE A DAY  . OVER THE COUNTER MEDICATION Mega red daily  . Probiotic Product (PROBIOTIC DAILY PO) Take by mouth daily.  . simvastatin (ZOCOR) 10 MG tablet TAKE 1 TABLET BY MOUTH EVERY DAY  . DENTA 5000 PLUS 1.1 % CREA dental cream USE AS DIRECTED TWO TIMES DAILY   No facility-administered encounter medications on file as of 06/09/2019.     Activities of Daily Living In your present state of health, do you have any difficulty performing the following activities: 06/09/2019  Hearing? N  Vision? N  Difficulty concentrating or making decisions? N  Walking or climbing stairs? N  Dressing or bathing? N  Doing errands, shopping? N  Preparing Food and eating ? N  Using the Toilet? N  In the past six months, have you accidently leaked urine? N  Do you have problems with loss of bowel control? N  Managing your Medications? N  Managing your Finances? N  Housekeeping or managing your Housekeeping? N  Some recent data might be hidden    Patient Care Team: Bradd CanaryBlyth, Stacey A, MD as PCP - General (Family Medicine)    Assessment:   This is a routine wellness examination for Cynthia Hubbard. Physical assessment deferred to PCP.  Exercise Activities and Dietary recommendations Current Exercise Habits: The patient does not participate in regular exercise at present, Exercise limited by: None identified      Goals    . DIET - INCREASE WATER INTAKE     Cut back on Pepsi and drink more water.    . Eat healthier and lose 10lbs  (pt-stated)    . Increase physical activity     Yoga and walking in park.      . Quit Smoking    . Weight (lb) < 130 lb  (59 kg)       Fall Risk Fall Risk  06/09/2019 04/26/2018 03/10/2017 09/09/2016 09/04/2015  Falls in the past year? 0 No No No Yes  Number falls in past yr: - - - - 1  Comment - - - - -  Injury with Fall? - - - -  No  Follow up - - - - Falls prevention discussed;Falls evaluation completed    Depression Screen PHQ 2/9 Scores 06/09/2019 04/26/2018 03/10/2017 09/09/2016  PHQ - 2 Score 0 0 0 0     Cognitive Function Ad8 score reviewed for issues:  Issues making decisions:no  Less interest in hobbies / activities:no  Repeats questions, stories (family complaining):no  Trouble using ordinary gadgets (microwave, computer, phone):no  Forgets the month or year: no  Mismanaging finances: no  Remembering appts:no  Daily problems with thinking and/or memory:no Ad8 score is=0     MMSE - Mini Mental State Exam 04/26/2018 06/21/2015  Orientation to time 5 5  Orientation to Place 5 5  Registration 3 3  Attention/ Calculation 5 5  Recall 2 3  Language- name 2 objects 2 2  Language- repeat 1 1  Language- follow 3 step command 3 3  Language- read & follow direction 1 1  Write a sentence 1 1  Copy design 1 1  Total score 29 30         There is no immunization history on file for this patient.  Screening Tests Health Maintenance  Topic Date Due  . Hepatitis C Screening  04-23-1951  . TETANUS/TDAP  06/12/1968  . COLONOSCOPY  06/13/1999  . PNA vac Low Risk Adult (1 of 2 - PCV13) 06/12/2014  . INFLUENZA VACCINE  05/21/2019  . MAMMOGRAM  08/03/2020  . DEXA SCAN  Completed        Plan:    Please schedule your next medicare wellness visit with me in 1 yr.  Continue to eat heart healthy diet (full of fruits, vegetables, whole grains, lean protein, water--limit salt, fat, and sugar intake) and increase physical activity as tolerated.  Continue doing brain stimulating activities (puzzles, reading, adult coloring books, staying active) to keep memory sharp.   Bring a copy of your  living will and/or healthcare power of attorney to your next office visit.    I have personally reviewed and noted the following in the patient's chart:   . Medical and social history . Use of alcohol, tobacco or illicit drugs  . Current medications and supplements . Functional ability and status . Nutritional status . Physical activity . Advanced directives . List of other physicians . Hospitalizations, surgeries, and ER visits in previous 12 months . Vitals . Screenings to include cognitive, depression, and falls . Referrals and appointments  In addition, I have reviewed and discussed with patient certain preventive protocols, quality metrics, and best practice recommendations. A written personalized care plan for preventive services as well as general preventive health recommendations were provided to patient.     Shela Nevin, [place]  06/09/2019

## 2019-06-12 DIAGNOSIS — M25552 Pain in left hip: Secondary | ICD-10-CM | POA: Insufficient documentation

## 2019-06-12 DIAGNOSIS — B070 Plantar wart: Secondary | ICD-10-CM | POA: Insufficient documentation

## 2019-06-12 NOTE — Assessment & Plan Note (Signed)
Encouraged moist heat and gentle stretching as tolerated. May try NSAIDs and prescription meds as directed and report if symptoms worsen or seek immediate care. She twisted it but she feels it is improving. Report if persists.

## 2019-06-12 NOTE — Assessment & Plan Note (Signed)
Two on right foot. Referred to podiatry.

## 2019-06-12 NOTE — Progress Notes (Signed)
Subjective:    Patient ID: Cynthia Hubbard, female    DOB: Jul 04, 1951, 68 y.o.   MRN: [id number]  No chief complaint on file.   HPI Patient is in today for follow up on chronic medical concerns including hyperglycemia, hypertension, and hyperlipidemia. She notes some tender lesions on the plantar surface of her right foot for months. She lso notes some pain in her left hip after twisting it some. No incontinence or radicular symptoms. Denies CP/palp/SOB/HA/congestion/fevers/GI or GU c/o. Taking meds as prescribed  Past Medical History:  Diagnosis Date  . Chicken pox 68 yrs old  . H/O measles    3 day measles   . History of chicken pox   . HTN (hypertension) 03/16/2014  . Hyperlipidemia    weighed 30 pounds heavier- used to take crestor  . Hypertension    was 30 pounds heavier  . Measles 6 th grade   3 day measles  . Osteopenia 09/14/2015  . Other and unspecified hyperlipidemia 03/16/2014  . Retinal hemorrhage of right eye 03/16/2014  . Tobacco abuse disorder 03/16/2014    Past Surgical History:  Procedure Laterality Date  . REFRACTIVE SURGERY Right     Family History  Adopted: Yes    Social History   Socioeconomic History  . Marital status: Married    Spouse name: Not on file  . Number of children: Not on file  . Years of education: Not on file  . Highest education level: Not on file  Occupational History  . Not on file  Social Needs  . Financial resource strain: Not on file  . Food insecurity    Worry: Not on file    Inability: Not on file  . Transportation needs    Medical: Not on file    Non-medical: Not on file  Tobacco Use  . Smoking status: Current Every Day Smoker    Packs/day: 0.75    Years: 40.00    Pack years: 30.00    Types: Cigarettes  . Smokeless tobacco: Never Used  . Tobacco comment: pt declines cessation material  Substance and Sexual Activity  . Alcohol use: Yes    Comment: very seldom  . Drug use: No  . Sexual activity: Yes    Comment:  lives with husband, no dietary restrictions, works part time at a furniture store  Lifestyle  . Physical activity    Days per week: Not on file    Minutes per session: Not on file  . Stress: Not on file  Relationships  . Social Herbalist on phone: Not on file    Gets together: Not on file    Attends religious service: Not on file    Active member of club or organization: Not on file    Attends meetings of clubs or organizations: Not on file    Relationship status: Not on file  . Intimate partner violence    Fear of current or ex partner: Not on file    Emotionally abused: Not on file    Physically abused: Not on file    Forced sexual activity: Not on file  Other Topics Concern  . Not on file  Social History Narrative  . Not on file    Outpatient Medications Prior to Visit  Medication Sig Dispense Refill  . Cholecalciferol (VITAMIN D3) 5000 UNITS CAPS Take 1 capsule by mouth daily.    . DENTA 5000 PLUS 1.1 % CREA dental cream USE AS DIRECTED TWO TIMES DAILY  3  . lisinopril (PRINIVIL,ZESTRIL) 5 MG tablet TAKE 1 TABLET BY MOUTH TWICE A DAY 180 tablet 1  . OVER THE COUNTER MEDICATION Mega red daily    . Probiotic Product (PROBIOTIC DAILY PO) Take by mouth daily.    . simvastatin (ZOCOR) 10 MG tablet TAKE 1 TABLET BY MOUTH EVERY DAY 90 tablet 1   No facility-administered medications prior to visit.     No Known Allergies  Review of Systems  Constitutional: Negative for fever and malaise/fatigue.  HENT: Negative for congestion.   Eyes: Negative for blurred vision.  Respiratory: Negative for shortness of breath.   Cardiovascular: Negative for chest pain, palpitations and leg swelling.  Gastrointestinal: Negative for abdominal pain, blood in stool and nausea.  Genitourinary: Negative for dysuria and frequency.  Musculoskeletal: Positive for joint pain. Negative for falls.  Skin: Positive for rash.  Neurological: Negative for dizziness, loss of consciousness and  headaches.  Endo/Heme/Allergies: Negative for environmental allergies.  Psychiatric/Behavioral: Negative for depression. The patient is not nervous/anxious.        Objective:    Physical Exam Vitals signs and nursing note reviewed.  Constitutional:      General: She is not in acute distress.    Appearance: She is well-developed.  HENT:     Head: Normocephalic and atraumatic.     Nose: Nose normal.  Eyes:     General:        Right eye: No discharge.        Left eye: No discharge.  Neck:     Musculoskeletal: Normal range of motion and neck supple.  Cardiovascular:     Rate and Rhythm: Normal rate and regular rhythm.     Heart sounds: No murmur.  Pulmonary:     Effort: Pulmonary effort is normal.     Breath sounds: Normal breath sounds.  Abdominal:     General: Bowel sounds are normal.     Palpations: Abdomen is soft.     Tenderness: There is no abdominal tenderness.  Skin:    General: Skin is warm and dry.     Comments: Plantar wart at heel and another at base of third toe on right foot.   Neurological:     Mental Status: She is alert and oriented to person, place, and time.     BP 110/70   Pulse (!) 57   Temp (!) 97.5 F (36.4 C) (Temporal)   Resp 16   Ht 5\' 4"  (1.626 m)   Wt 144 lb 6.4 oz (65.5 kg)   SpO2 97%   BMI 24.79 kg/m  Wt Readings from Last 3 Encounters:  06/09/19 144 lb 6.4 oz (65.5 kg)  10/29/18 137 lb 6.4 oz (62.3 kg)  04/26/18 135 lb (61.2 kg)    Diabetic Foot Exam - Simple   No data filed     Lab Results  Component Value Date   WBC 7.0 06/09/2019   HGB 13.8 06/09/2019   HCT 41.3 06/09/2019   PLT 178.0 06/09/2019   GLUCOSE 92 06/09/2019   CHOL 195 06/09/2019   TRIG 138.0 06/09/2019   HDL 39.00 (L) 06/09/2019   LDLCALC 129 (H) 06/09/2019   ALT 12 06/09/2019   AST 18 06/09/2019   NA 136 06/09/2019   K 4.5 06/09/2019   CL 101 06/09/2019   CREATININE 0.81 06/09/2019   BUN 13 06/09/2019   CO2 27 06/09/2019   TSH 0.69 06/09/2019    HGBA1C 5.8 06/09/2019    Lab Results  Component Value Date   TSH 0.69 06/09/2019   Lab Results  Component Value Date   WBC 7.0 06/09/2019   HGB 13.8 06/09/2019   HCT 41.3 06/09/2019   MCV 83.0 06/09/2019   PLT 178.0 06/09/2019   Lab Results  Component Value Date   NA 136 06/09/2019   K 4.5 06/09/2019   CO2 27 06/09/2019   GLUCOSE 92 06/09/2019   BUN 13 06/09/2019   CREATININE 0.81 06/09/2019   BILITOT 0.5 06/09/2019   ALKPHOS 90 06/09/2019   AST 18 06/09/2019   ALT 12 06/09/2019   PROT 7.6 06/09/2019   ALBUMIN 4.7 06/09/2019   CALCIUM 9.7 06/09/2019   GFR 69.91 06/09/2019   Lab Results  Component Value Date   CHOL 195 06/09/2019   Lab Results  Component Value Date   HDL 39.00 (L) 06/09/2019   Lab Results  Component Value Date   LDLCALC 129 (H) 06/09/2019   Lab Results  Component Value Date   TRIG 138.0 06/09/2019   Lab Results  Component Value Date   CHOLHDL 5 06/09/2019   Lab Results  Component Value Date   HGBA1C 5.8 06/09/2019       Assessment & Plan:   Problem List Items Addressed This Visit    HTN (hypertension) (Chronic)    Well controlled, no changes to meds. Encouraged heart healthy diet such as the DASH diet and exercise as tolerated.       Relevant Orders   CBC (Completed)   Comprehensive metabolic panel (Completed)   TSH (Completed)   Hyperlipidemia, mixed    Encouraged heart healthy diet, increase exercise, avoid trans fats and carb counting.       Relevant Orders   Lipid panel (Completed)   Tobacco use disorder    Encouraged complete cessation. Discussed need to quit as relates to risk of numerous cancers, cardiac and pulmonary disease as well as neurologic complications. Counseled for greater than 3 minutes      Hyperglycemia    hgba1c acceptable, minimize simple carbs. Increase exercise as tolerated.      Relevant Orders   Hemoglobin A1c (Completed)   Plantar warts    Two on right foot. Referred to podiatry.       Hip pain, left    Encouraged moist heat and gentle stretching as tolerated. May try NSAIDs and prescription meds as directed and report if symptoms worsen or seek immediate care. She twisted it but she feels it is improving. Report if persists.        Other Visit Diagnoses    Viral warts, unspecified type    -  Primary   Relevant Orders   Ambulatory referral to Podiatry      I am having Cynthia Hubbard maintain her OVER THE COUNTER MEDICATION, Vitamin D3, Probiotic Product (PROBIOTIC DAILY PO), Denta 5000 Plus, lisinopril, and simvastatin.  No orders of the defined types were placed in this encounter.    Cynthia EdgeStacey Victorina Kable, MD

## 2019-06-17 ENCOUNTER — Encounter: Payer: Self-pay | Admitting: Podiatry

## 2019-06-17 ENCOUNTER — Ambulatory Visit: Payer: Medicare HMO | Admitting: Podiatry

## 2019-06-17 ENCOUNTER — Other Ambulatory Visit: Payer: Self-pay

## 2019-06-17 ENCOUNTER — Ambulatory Visit (INDEPENDENT_AMBULATORY_CARE_PROVIDER_SITE_OTHER): Payer: Medicare HMO

## 2019-06-17 VITALS — BP 140/84 | HR 65 | Resp 16

## 2019-06-17 DIAGNOSIS — M2041 Other hammer toe(s) (acquired), right foot: Secondary | ICD-10-CM

## 2019-06-17 DIAGNOSIS — Q828 Other specified congenital malformations of skin: Secondary | ICD-10-CM

## 2019-06-17 DIAGNOSIS — B079 Viral wart, unspecified: Secondary | ICD-10-CM

## 2019-06-17 DIAGNOSIS — B078 Other viral warts: Secondary | ICD-10-CM | POA: Diagnosis not present

## 2019-06-17 DIAGNOSIS — M216X9 Other acquired deformities of unspecified foot: Secondary | ICD-10-CM

## 2019-06-17 DIAGNOSIS — M779 Enthesopathy, unspecified: Secondary | ICD-10-CM | POA: Diagnosis not present

## 2019-06-17 DIAGNOSIS — M778 Other enthesopathies, not elsewhere classified: Secondary | ICD-10-CM

## 2019-06-17 NOTE — Progress Notes (Signed)
Subjective:   Patient ID: Cynthia Hubbard, female   DOB: 68 y.o.   MRN: [id number]   HPI 68 year old female presents the office today for concerns of a painful skin lesion on the ball of her right foot as well as her left heel.  She states this been ongoing for 10 years on her right side and longer on her left side.  She was told that she had a likely wart.  Also she has noticed that her right second toe started to contract but not causing any pain.  No recent injury.  She has no other concerns today.   Review of Systems  All other systems reviewed and are negative.  Past Medical History:  Diagnosis Date  . Chicken pox 68 yrs old  . H/O measles    3 day measles   . History of chicken pox   . HTN (hypertension) 03/16/2014  . Hyperlipidemia    weighed 30 pounds heavier- used to take crestor  . Hypertension    was 30 pounds heavier  . Measles 6 th grade   3 day measles  . Osteopenia 09/14/2015  . Other and unspecified hyperlipidemia 03/16/2014  . Retinal hemorrhage of right eye 03/16/2014  . Tobacco abuse disorder 03/16/2014    Past Surgical History:  Procedure Laterality Date  . REFRACTIVE SURGERY Right      Current Outpatient Medications:  .  Ascorbic Acid (VITAMIN C PO), Take by mouth., Disp: , Rfl:  .  FOLIC ACID PO, Take by mouth., Disp: , Rfl:  .  Multiple Vitamins-Minerals (ZINC PO), Take by mouth., Disp: , Rfl:  .  Cholecalciferol (VITAMIN D3) 5000 UNITS CAPS, Take 1 capsule by mouth daily., Disp: , Rfl:  .  lisinopril (PRINIVIL,ZESTRIL) 5 MG tablet, TAKE 1 TABLET BY MOUTH TWICE A DAY, Disp: 180 tablet, Rfl: 1 .  OVER THE COUNTER MEDICATION, Mega red daily, Disp: , Rfl:  .  Probiotic Product (PROBIOTIC DAILY PO), Take by mouth daily., Disp: , Rfl:  .  simvastatin (ZOCOR) 10 MG tablet, TAKE 1 TABLET BY MOUTH EVERY DAY, Disp: 90 tablet, Rfl: 1  No Known Allergies       Objective:  Physical Exam  General: AAO x3, NAD  Dermatological: Thick hyperkeratotic tissue right  foot submetatarsal 2.  Upon debridement appears to be a deep porokeratosis possible wart.  There is no surrounding erythema, edema, drainage or pus or any signs of infection.  Smaller lesion on the left heel with evidence of verruca upon debridement.  No other skin lesions are identified.  Vascular: Dorsalis Pedis artery and Posterior Tibial artery pedal pulses are 2/4 bilateral with immedate capillary fill time.  There is no pain with calf compression, swelling, warmth, erythema.   Neruologic: Grossly intact via light touch bilateral.Protective threshold with Semmes Wienstein monofilament intact to all pedal sites bilateral  Musculoskeletal: Hammertoe contractures present of the right second toe.  Muscular strength 5/5 in all groups tested bilateral.  Gait: Unassisted, Nonantalgic.       Assessment:   Bilateral foot porokeratosis, wart     Plan:  -Treatment options discussed including all alternatives, risks, and complications -Etiology of symptoms were discussed -X-rays were obtained and reviewed with the patient. Elongated second metatarsal with hammertoe contractures present.  No evidence of acute fracture. -Debrided lesions x2 without any complications or bleeding.  Area skin with alcohol and a pad was placed followed by salicylic acid and a bandage.  Post procedure instructions discussed.  Monitor for any signs or  symptoms of infection.  Return in about 3 weeks (around 07/08/2019).  Vivi BarrackMatthew R  DPM

## 2019-06-17 NOTE — Patient Instructions (Signed)
Keep the bandage on for 24 hours. At that time, remove and clean with soap and water. If it hurts or burns before 24 hours go ahead and remove the bandage and wash with soap and water. Keep the area clean. If there is any blistering cover with antibiotic ointment and a bandage. Monitor for any redness, drainage, or other signs of infection. Call the office if any are to occur. If you have any questions, please call the office at [phone].  

## 2019-06-26 ENCOUNTER — Other Ambulatory Visit: Payer: Self-pay | Admitting: Family Medicine

## 2019-07-08 ENCOUNTER — Other Ambulatory Visit: Payer: Self-pay

## 2019-07-08 ENCOUNTER — Ambulatory Visit: Payer: Medicare HMO | Admitting: Podiatry

## 2019-07-08 ENCOUNTER — Encounter: Payer: Self-pay | Admitting: Podiatry

## 2019-07-08 DIAGNOSIS — Q828 Other specified congenital malformations of skin: Secondary | ICD-10-CM

## 2019-07-13 ENCOUNTER — Other Ambulatory Visit: Payer: Self-pay | Admitting: Family Medicine

## 2019-07-20 NOTE — Progress Notes (Signed)
Subjective: 68 year old female presents the office today for follow-up evaluation of skin lesions of both her right foot as well as her left heel.  She says overall she is getting a whole lot better.  No open sores and denies any redness or drainage or any swelling. Denies any systemic complaints such as fevers, chills, nausea, vomiting. No acute changes since last appointment, and no other complaints at this time.   Objective: AAO x3, NAD DP/PT pulses palpable bilaterally, CRT less than 3 seconds Minimal hyperkeratotic tissue right foot metatarsal to the left heel.  Duration drainage or signs of infection.  No evidence of verruca. No pain with calf compression, swelling, warmth, erythema  Assessment: Improved skin lesions bilaterally  Plan: -All treatment options discussed with the patient including all alternatives, risks, complications.  -I debrided some of the hyperkeratotic tissue today without any complications or bleeding.  Continue with moisturizer daily.  Offloading. -Patient encouraged to call the office with any questions, concerns, change in symptoms.   Trula Slade DPM

## 2019-08-09 ENCOUNTER — Encounter: Payer: Self-pay | Admitting: Family Medicine

## 2019-08-09 DIAGNOSIS — Z1231 Encounter for screening mammogram for malignant neoplasm of breast: Secondary | ICD-10-CM | POA: Diagnosis not present

## 2019-08-09 DIAGNOSIS — R69 Illness, unspecified: Secondary | ICD-10-CM | POA: Diagnosis not present

## 2019-08-19 ENCOUNTER — Ambulatory Visit: Payer: Medicare HMO | Admitting: Podiatry

## 2019-12-15 ENCOUNTER — Ambulatory Visit: Payer: Medicare HMO | Admitting: Family Medicine

## 2020-01-10 ENCOUNTER — Other Ambulatory Visit: Payer: Self-pay

## 2020-01-10 ENCOUNTER — Encounter: Payer: Self-pay | Admitting: Family Medicine

## 2020-01-10 ENCOUNTER — Ambulatory Visit (INDEPENDENT_AMBULATORY_CARE_PROVIDER_SITE_OTHER): Payer: Medicare HMO | Admitting: Family Medicine

## 2020-01-10 ENCOUNTER — Other Ambulatory Visit: Payer: Self-pay | Admitting: Family Medicine

## 2020-01-10 DIAGNOSIS — R0981 Nasal congestion: Secondary | ICD-10-CM

## 2020-01-10 DIAGNOSIS — M8580 Other specified disorders of bone density and structure, unspecified site: Secondary | ICD-10-CM

## 2020-01-10 DIAGNOSIS — R739 Hyperglycemia, unspecified: Secondary | ICD-10-CM | POA: Diagnosis not present

## 2020-01-10 DIAGNOSIS — J3489 Other specified disorders of nose and nasal sinuses: Secondary | ICD-10-CM | POA: Diagnosis not present

## 2020-01-10 DIAGNOSIS — E782 Mixed hyperlipidemia: Secondary | ICD-10-CM

## 2020-01-10 DIAGNOSIS — F17200 Nicotine dependence, unspecified, uncomplicated: Secondary | ICD-10-CM

## 2020-01-10 DIAGNOSIS — I10 Essential (primary) hypertension: Secondary | ICD-10-CM

## 2020-01-10 DIAGNOSIS — R69 Illness, unspecified: Secondary | ICD-10-CM | POA: Diagnosis not present

## 2020-01-10 NOTE — Assessment & Plan Note (Signed)
Encouraged heart healthy diet, increase exercise, avoid trans fats, consider a krill oil cap daily 

## 2020-01-10 NOTE — Assessment & Plan Note (Signed)
Encouraged complete cessation. Discussed need to quit as relates to risk of numerous cancers, cardiac and pulmonary disease as well as neurologic complications. Counseled for greater than 3 minutes. Is trying to quit more seriously. Her husband has now been diagnosed with COPD. She is now below 3/4 ppd. Encouraged to attempt complete cessation

## 2020-01-10 NOTE — Progress Notes (Signed)
Virtual Visit via phone Note  I connected with [name] on 01/10/20 at 11:00 AM EDT by a phone enabled telemedicine application and verified that I am speaking with the correct person using two identifiers.  Location: Patient: home Provider: office   I discussed the limitations of evaluation and management by telemedicine and the availability of in person appointments. The patient expressed understanding and agreed to proceed. Kem Boroughs, CMA was able to get the patient set up on a visit, phone after being unable to set up a video visit   Subjective:    Patient ID: Cynthia Hubbard, female    DOB: 15-May-1951, 69 y.o.   MRN: [id number]  Chief Complaint  Patient presents with  . 6 month follow up    HPI Patient is in today for follow up on chronic medical concerns. No recent febrile illness or hospitalizations. Is trying to eat better and decrease carb intake. Has not been exercising but she is trying to improve that. She continues to smoke but has decreased to just under 3/4 PPD. Denies CP/palp/SOB/HA/congestion/fevers/GI or GU c/o. Taking meds as prescribed  Past Medical History:  Diagnosis Date  . Chicken pox 69 yrs old  . H/O measles    3 day measles   . History of chicken pox   . HTN (hypertension) 03/16/2014  . Hyperlipidemia    weighed 30 pounds heavier- used to take crestor  . Hypertension    was 30 pounds heavier  . Measles 6 th grade   3 day measles  . Osteopenia 09/14/2015  . Other and unspecified hyperlipidemia 03/16/2014  . Retinal hemorrhage of right eye 03/16/2014  . Tobacco abuse disorder 03/16/2014    Past Surgical History:  Procedure Laterality Date  . REFRACTIVE SURGERY Right     Family History  Adopted: Yes    Social History   Socioeconomic History  . Marital status: Married    Spouse name: Not on file  . Number of children: Not on file  . Years of education: Not on file  . Highest education level: Not on file  Occupational History  .  Not on file  Tobacco Use  . Smoking status: Current Every Day Smoker    Packs/day: 0.75    Years: 40.00    Pack years: 30.00    Types: Cigarettes  . Smokeless tobacco: Never Used  . Tobacco comment: pt declines cessation material  Substance and Sexual Activity  . Alcohol use: Yes    Comment: very seldom  . Drug use: No  . Sexual activity: Yes    Comment: lives with husband, no dietary restrictions, works part time at [employer]  Other Topics Concern  . Not on file  Social History Narrative  . Not on file   Social Determinants of Health   Financial Resource Strain:   . Difficulty of Paying Living Expenses:   Food Insecurity:   . Worried About Charity fundraiser in the Last Year:   . Arboriculturist in the Last Year:   Transportation Needs:   . Film/video editor (Medical):   Marland Kitchen Lack of Transportation (Non-Medical):   Physical Activity:   . Days of Exercise per Week:   . Minutes of Exercise per Session:   Stress:   . Feeling of Stress :   Social Connections:   . Frequency of Communication with Friends and Family:   . Frequency of Social Gatherings with Friends and Family:   . Attends Religious Services:   .  Active Member of Clubs or Organizations:   . Attends Banker Meetings:   Marland Kitchen Marital Status:   Intimate Partner Violence:   . Fear of Current or Ex-Partner:   . Emotionally Abused:   Marland Kitchen Physically Abused:   . Sexually Abused:     Outpatient Medications Prior to Visit  Medication Sig Dispense Refill  . Cholecalciferol (VITAMIN D3) 5000 UNITS CAPS Take 1 capsule by mouth daily.    Marland Kitchen FOLIC ACID PO Take by mouth.    Marland Kitchen lisinopril (ZESTRIL) 5 MG tablet TAKE 1 TABLET BY MOUTH TWICE A DAY 180 tablet 1  . Multiple Vitamins-Minerals (ZINC PO) Take by mouth.    Marland Kitchen OVER THE COUNTER MEDICATION Mega red daily    . Probiotic Product (PROBIOTIC DAILY PO) Take by mouth daily.    . simvastatin (ZOCOR) 10 MG tablet TAKE 1 TABLET BY MOUTH EVERY DAY 90 tablet  1  . Ascorbic Acid (VITAMIN C PO) Take by mouth.     No facility-administered medications prior to visit.    No Known Allergies  Review of Systems  Constitutional: Negative for fever and malaise/fatigue.  HENT: Negative for congestion.   Eyes: Negative for blurred vision.  Respiratory: Negative for shortness of breath.   Cardiovascular: Negative for chest pain, palpitations and leg swelling.  Gastrointestinal: Negative for abdominal pain, blood in stool and nausea.  Genitourinary: Negative for dysuria and frequency.  Musculoskeletal: Negative for falls.  Skin: Negative for rash.  Neurological: Negative for dizziness, loss of consciousness and headaches.  Endo/Heme/Allergies: Negative for environmental allergies.  Psychiatric/Behavioral: Negative for depression. The patient is not nervous/anxious.        Objective:    Physical Exam unable to perform via phone visit  BP 135/78   Pulse 61   Temp (!) 97.3 F (36.3 C)   Wt 143 lb (64.9 kg)   SpO2 97%   BMI 24.55 kg/m  Wt Readings from Last 3 Encounters:  01/10/20 143 lb (64.9 kg)  06/09/19 144 lb 6.4 oz (65.5 kg)  10/29/18 137 lb 6.4 oz (62.3 kg)    Diabetic Foot Exam - Simple   No data filed     Lab Results  Component Value Date   WBC 7.0 06/09/2019   HGB 13.8 06/09/2019   HCT 41.3 06/09/2019   PLT 178.0 06/09/2019   GLUCOSE 92 06/09/2019   CHOL 195 06/09/2019   TRIG 138.0 06/09/2019   HDL 39.00 (L) 06/09/2019   LDLCALC 129 (H) 06/09/2019   ALT 12 06/09/2019   AST 18 06/09/2019   NA 136 06/09/2019   K 4.5 06/09/2019   CL 101 06/09/2019   CREATININE 0.81 06/09/2019   BUN 13 06/09/2019   CO2 27 06/09/2019   TSH 0.69 06/09/2019   HGBA1C 5.8 06/09/2019    Lab Results  Component Value Date   TSH 0.69 06/09/2019   Lab Results  Component Value Date   WBC 7.0 06/09/2019   HGB 13.8 06/09/2019   HCT 41.3 06/09/2019   MCV 83.0 06/09/2019   PLT 178.0 06/09/2019   Lab Results  Component Value Date    NA 136 06/09/2019   K 4.5 06/09/2019   CO2 27 06/09/2019   GLUCOSE 92 06/09/2019   BUN 13 06/09/2019   CREATININE 0.81 06/09/2019   BILITOT 0.5 06/09/2019   ALKPHOS 90 06/09/2019   AST 18 06/09/2019   ALT 12 06/09/2019   PROT 7.6 06/09/2019   ALBUMIN 4.7 06/09/2019   CALCIUM 9.7 06/09/2019  GFR 69.91 06/09/2019   Lab Results  Component Value Date   CHOL 195 06/09/2019   Lab Results  Component Value Date   HDL 39.00 (L) 06/09/2019   Lab Results  Component Value Date   LDLCALC 129 (H) 06/09/2019   Lab Results  Component Value Date   TRIG 138.0 06/09/2019   Lab Results  Component Value Date   CHOLHDL 5 06/09/2019   Lab Results  Component Value Date   HGBA1C 5.8 06/09/2019       Assessment & Plan:   Problem List Items Addressed This Visit    HTN (hypertension) (Chronic)    Monitor and report any concerns, no changes to meds. Encouraged heart healthy diet such as the DASH diet and exercise as tolerated.       Hyperlipidemia, mixed    Encouraged heart healthy diet, increase exercise, avoid trans fats, consider a krill oil cap daily      Tobacco use disorder    Encouraged complete cessation. Discussed need to quit as relates to risk of numerous cancers, cardiac and pulmonary disease as well as neurologic complications. Counseled for greater than 3 minutes. Is trying to quit more seriously. Her husband has now been diagnosed with COPD. She is now below 3/4 ppd. Encouraged to attempt complete cessation      Nasal congestion with rhinorrhea    Has been in mostly and her symptoms have been better, no sinus infection this winter like she usually gets      Osteopenia    Encouraged to get adequate exercise, calcium and vitamin d intake      Hyperglycemia    hgba1c acceptable, minimize simple carbs. Increase exercise as tolerated         I have discontinued Murvin Natal Ascorbic Acid (VITAMIN C PO). I am also having her maintain her OVER THE COUNTER  MEDICATION, Vitamin D3, Probiotic Product (PROBIOTIC DAILY PO), Multiple Vitamins-Minerals (ZINC PO), FOLIC ACID PO, simvastatin, and lisinopril.  No orders of the defined types were placed in this encounter.     I discussed the assessment and treatment plan with the patient. The patient was provided an opportunity to ask questions and all were answered. The patient agreed with the plan and demonstrated an understanding of the instructions.   The patient was advised to call back or seek an in-person evaluation if the symptoms worsen or if the condition fails to improve as anticipated.  I provided 25 minutes of non-face-to-face time during this encounter.   Danise Edge, MD

## 2020-01-10 NOTE — Assessment & Plan Note (Signed)
Has been in mostly and her symptoms have been better, no sinus infection this winter like she usually gets

## 2020-01-10 NOTE — Assessment & Plan Note (Signed)
Monitor and report any concerns, no changes to meds. Encouraged heart healthy diet such as the DASH diet and exercise as tolerated.  ?

## 2020-01-10 NOTE — Assessment & Plan Note (Signed)
hgba1c acceptable, minimize simple carbs. Increase exercise as tolerated.  

## 2020-01-10 NOTE — Assessment & Plan Note (Signed)
Encouraged to get adequate exercise, calcium and vitamin d intake 

## 2020-01-16 ENCOUNTER — Other Ambulatory Visit: Payer: Self-pay

## 2020-01-16 ENCOUNTER — Other Ambulatory Visit (INDEPENDENT_AMBULATORY_CARE_PROVIDER_SITE_OTHER): Payer: Medicare HMO

## 2020-01-16 DIAGNOSIS — R739 Hyperglycemia, unspecified: Secondary | ICD-10-CM

## 2020-01-16 DIAGNOSIS — I10 Essential (primary) hypertension: Secondary | ICD-10-CM | POA: Diagnosis not present

## 2020-01-16 DIAGNOSIS — E782 Mixed hyperlipidemia: Secondary | ICD-10-CM

## 2020-01-16 LAB — COMPREHENSIVE METABOLIC PANEL
ALT: 12 U/L (ref 0–35)
AST: 18 U/L (ref 0–37)
Albumin: 4.4 g/dL (ref 3.5–5.2)
Alkaline Phosphatase: 90 U/L (ref 39–117)
BUN: 20 mg/dL (ref 6–23)
CO2: 26 mEq/L (ref 19–32)
Calcium: 9.8 mg/dL (ref 8.4–10.5)
Chloride: 102 mEq/L (ref 96–112)
Creatinine, Ser: 0.95 mg/dL (ref 0.40–1.20)
GFR: 58.06 mL/min — ABNORMAL LOW (ref >60.00)
Glucose, Bld: 88 mg/dL (ref 70–99)
Potassium: 5.1 mEq/L (ref 3.5–5.1)
Sodium: 136 mEq/L (ref 135–145)
Total Bilirubin: 0.5 mg/dL (ref 0.2–1.2)
Total Protein: 7.5 g/dL (ref 6.0–8.3)

## 2020-01-16 LAB — LIPID PANEL
Cholesterol: 186 mg/dL (ref 0–200)
HDL: 42.6 mg/dL (ref >39.00)
LDL Cholesterol: 118 mg/dL — ABNORMAL HIGH (ref 0–99)
NonHDL: 143.44
Total CHOL/HDL Ratio: 4
Triglycerides: 127 mg/dL (ref 0.0–149.0)
VLDL: 25.4 mg/dL (ref 0.0–40.0)

## 2020-01-16 LAB — HEMOGLOBIN A1C: Hgb A1c MFr Bld: 5.7 % (ref 4.6–6.5)

## 2020-01-16 LAB — TSH: TSH: 1.13 u[IU]/mL (ref 0.35–4.50)

## 2020-01-17 ENCOUNTER — Encounter: Payer: Self-pay | Admitting: Family Medicine

## 2020-01-17 LAB — CBC
HCT: 40.3 % (ref 36.0–46.0)
Hemoglobin: 13.6 g/dL (ref 12.0–15.0)
MCHC: 33.7 g/dL (ref 30.0–36.0)
MCV: 81.6 fl (ref 78.0–100.0)
Platelets: 84 10*3/uL — ABNORMAL LOW (ref 150.0–400.0)
RBC: 4.94 Mil/uL (ref 3.87–5.11)
RDW: 13.3 % (ref 11.5–15.5)
WBC: 5.4 10*3/uL (ref 4.0–10.5)

## 2020-01-23 ENCOUNTER — Other Ambulatory Visit: Payer: Self-pay | Admitting: *Deleted

## 2020-01-23 DIAGNOSIS — D696 Thrombocytopenia, unspecified: Secondary | ICD-10-CM

## 2020-01-23 NOTE — Telephone Encounter (Signed)
Appointment made and future order placed.

## 2020-02-01 ENCOUNTER — Other Ambulatory Visit (INDEPENDENT_AMBULATORY_CARE_PROVIDER_SITE_OTHER): Payer: Medicare HMO

## 2020-02-01 ENCOUNTER — Other Ambulatory Visit: Payer: Self-pay

## 2020-02-01 DIAGNOSIS — D696 Thrombocytopenia, unspecified: Secondary | ICD-10-CM | POA: Diagnosis not present

## 2020-02-01 LAB — CBC
HCT: 38 % (ref 36.0–46.0)
Hemoglobin: 12.7 g/dL (ref 12.0–15.0)
MCHC: 33.4 g/dL (ref 30.0–36.0)
MCV: 81.5 fl (ref 78.0–100.0)
Platelets: 157 10*3/uL (ref 150.0–400.0)
RBC: 4.66 Mil/uL (ref 3.87–5.11)
RDW: 13.5 % (ref 11.5–15.5)
WBC: 6.1 10*3/uL (ref 4.0–10.5)

## 2020-02-02 DIAGNOSIS — R69 Illness, unspecified: Secondary | ICD-10-CM | POA: Diagnosis not present

## 2020-02-14 ENCOUNTER — Other Ambulatory Visit: Payer: Self-pay | Admitting: Family Medicine

## 2020-05-29 DIAGNOSIS — R69 Illness, unspecified: Secondary | ICD-10-CM | POA: Diagnosis not present

## 2020-07-17 ENCOUNTER — Other Ambulatory Visit: Payer: Self-pay

## 2020-07-17 ENCOUNTER — Ambulatory Visit (INDEPENDENT_AMBULATORY_CARE_PROVIDER_SITE_OTHER): Payer: Medicare HMO | Admitting: Family Medicine

## 2020-07-17 ENCOUNTER — Encounter: Payer: Self-pay | Admitting: Family Medicine

## 2020-07-17 VITALS — BP 121/70 | HR 77 | Temp 98.2°F | Resp 12 | Ht 64.0 in | Wt 140.4 lb

## 2020-07-17 DIAGNOSIS — I10 Essential (primary) hypertension: Secondary | ICD-10-CM

## 2020-07-17 DIAGNOSIS — Z0000 Encounter for general adult medical examination without abnormal findings: Secondary | ICD-10-CM

## 2020-07-17 DIAGNOSIS — Z23 Encounter for immunization: Secondary | ICD-10-CM

## 2020-07-17 DIAGNOSIS — T7840XD Allergy, unspecified, subsequent encounter: Secondary | ICD-10-CM | POA: Diagnosis not present

## 2020-07-17 DIAGNOSIS — F17200 Nicotine dependence, unspecified, uncomplicated: Secondary | ICD-10-CM

## 2020-07-17 DIAGNOSIS — L989 Disorder of the skin and subcutaneous tissue, unspecified: Secondary | ICD-10-CM | POA: Diagnosis not present

## 2020-07-17 DIAGNOSIS — L578 Other skin changes due to chronic exposure to nonionizing radiation: Secondary | ICD-10-CM | POA: Insufficient documentation

## 2020-07-17 DIAGNOSIS — R739 Hyperglycemia, unspecified: Secondary | ICD-10-CM

## 2020-07-17 DIAGNOSIS — R69 Illness, unspecified: Secondary | ICD-10-CM | POA: Diagnosis not present

## 2020-07-17 DIAGNOSIS — E782 Mixed hyperlipidemia: Secondary | ICD-10-CM | POA: Diagnosis not present

## 2020-07-17 DIAGNOSIS — D696 Thrombocytopenia, unspecified: Secondary | ICD-10-CM

## 2020-07-17 DIAGNOSIS — T7840XA Allergy, unspecified, initial encounter: Secondary | ICD-10-CM | POA: Insufficient documentation

## 2020-07-17 DIAGNOSIS — R350 Frequency of micturition: Secondary | ICD-10-CM

## 2020-07-17 DIAGNOSIS — J0101 Acute recurrent maxillary sinusitis: Secondary | ICD-10-CM | POA: Diagnosis not present

## 2020-07-17 MED ORDER — AMOXICILLIN 500 MG PO CAPS: 500.0000 mg | ORAL_CAPSULE | Freq: Three times a day (TID) | ORAL | 0 refills | Status: DC

## 2020-07-17 NOTE — Assessment & Plan Note (Signed)
Well controlled, no changes to meds. Encouraged heart healthy diet such as the DASH diet and exercise as tolerated.  °

## 2020-07-17 NOTE — Assessment & Plan Note (Signed)
hgba1c acceptable, minimize simple carbs. Increase exercise as tolerated. Continue current meds 

## 2020-07-17 NOTE — Assessment & Plan Note (Addendum)
Patient encouraged to maintain heart healthy diet, regular exercise, adequate sleep. Consider daily probiotics. Take medications as prescribed. Labs ordered and reviewed. Declines colonoscopy or any stool testing including Cologuard and she is aware her risk of colon cancer as a smoker. Dexa scan 08/03/2018 repeat in 1-2 years, [facility] 08/09/2019 has a repeat ordered for next month already. Pap was 10/29/2018 repeat in 2 more years.

## 2020-07-17 NOTE — Patient Instructions (Signed)
Preventive Care 38 Years and Older, Female Preventive care refers to lifestyle choices and visits with your health care provider that can promote health and wellness. This includes:  A yearly physical exam. This is also called an annual well check.  Regular dental and eye exams.  Immunizations.  Screening for certain conditions.  Healthy lifestyle choices, such as diet and exercise. What can I expect for my preventive care visit? Physical exam Your health care provider will check:  Height and weight. These may be used to calculate body mass index (BMI), which is a measurement that tells if you are at a healthy weight.  Heart rate and blood pressure.  Your skin for abnormal spots. Counseling Your health care provider may ask you questions about:  Alcohol, tobacco, and drug use.  Emotional well-being.  Home and relationship well-being.  Sexual activity.  Eating habits.  History of falls.  Memory and ability to understand (cognition).  Work and work Statistician.  Pregnancy and menstrual history. What immunizations do I need?  Influenza (flu) vaccine  This is recommended every year. Tetanus, diphtheria, and pertussis (Tdap) vaccine  You may need a Td booster every 10 years. Varicella (chickenpox) vaccine  You may need this vaccine if you have not already been vaccinated. Zoster (shingles) vaccine  You may need this after age 33. Pneumococcal conjugate (PCV13) vaccine  One dose is recommended after age 33. Pneumococcal polysaccharide (PPSV23) vaccine  One dose is recommended after age 72. Measles, mumps, and rubella (MMR) vaccine  You may need at least one dose of MMR if you were born in 1957 or later. You may also need a second dose. Meningococcal conjugate (MenACWY) vaccine  You may need this if you have certain conditions. Hepatitis A vaccine  You may need this if you have certain conditions or if you travel or work in places where you may be exposed  to hepatitis A. Hepatitis B vaccine  You may need this if you have certain conditions or if you travel or work in places where you may be exposed to hepatitis B. Haemophilus influenzae type b (Hib) vaccine  You may need this if you have certain conditions. You may receive vaccines as individual doses or as more than one vaccine together in one shot (combination vaccines). Talk with your health care provider about the risks and benefits of combination vaccines. What tests do I need? Blood tests  Lipid and cholesterol levels. These may be checked every 5 years, or more frequently depending on your overall health.  Hepatitis C test.  Hepatitis B test. Screening  Lung cancer screening. You may have this screening every year starting at age 39 if you have a 30-pack-year history of smoking and currently smoke or have quit within the past 15 years.  Colorectal cancer screening. All adults should have this screening starting at age 36 and continuing until age 15. Your health care provider may recommend screening at age 23 if you are at increased risk. You will have tests every 1-10 years, depending on your results and the type of screening test.  Diabetes screening. This is done by checking your blood sugar (glucose) after you have not eaten for a while (fasting). You may have this done every 1-3 years.  Mammogram. This may be done every 1-2 years. Talk with your health care provider about how often you should have regular mammograms.  BRCA-related cancer screening. This may be done if you have a family history of breast, ovarian, tubal, or peritoneal cancers.  Other tests  Sexually transmitted disease (STD) testing.  Bone density scan. This is done to screen for osteoporosis. You may have this done starting at age 44. Follow these instructions at home: Eating and drinking  Eat a diet that includes fresh fruits and vegetables, whole grains, lean protein, and low-fat dairy products. Limit  your intake of foods with high amounts of sugar, saturated fats, and salt.  Take vitamin and mineral supplements as recommended by your health care provider.  Do not drink alcohol if your health care provider tells you not to drink.  If you drink alcohol: ? Limit how much you have to 0-1 drink a day. ? Be aware of how much alcohol is in your drink. In the [place] one drink equals one 12 oz bottle of beer (355 mL), one 5 oz glass of wine (148 mL), or one 1 oz glass of hard liquor (44 mL). Lifestyle  Take daily care of your teeth and gums.  Stay active. Exercise for at least 30 minutes on 5 or more days each week.  Do not use any products that contain nicotine or tobacco, such as cigarettes, e-cigarettes, and chewing tobacco. If you need help quitting, ask your health care provider.  If you are sexually active, practice safe sex. Use a condom or other form of protection in order to prevent STIs (sexually transmitted infections).  Talk with your health care provider about taking a low-dose aspirin or statin. What's next?  Go to your health care provider once a year for a well check visit.  Ask your health care provider how often you should have your eyes and teeth checked.  Stay up to date on all vaccines. This information is not intended to replace advice given to you by your health care provider. Make sure you discuss any questions you have with your health care provider. Document Revised: 09/30/2018 Document Reviewed: 09/30/2018 Elsevier Patient Education  2020 [employer].

## 2020-07-17 NOTE — Assessment & Plan Note (Signed)
Clean daily with mild soap and water and keep clean and dry. Given Tdap today

## 2020-07-17 NOTE — Assessment & Plan Note (Signed)
She always has congestion, eye symptoms and PND this time of year each year and symptoms started again yesterday. She took some zyrtec has not helped

## 2020-07-17 NOTE — Assessment & Plan Note (Signed)
Husband recently treated with antibiotics for a respiratory infection and improved. She is allowed a course of Amoxicillin 500 mg po tid x 7 days. Encouraged increased rest and hydration, add probiotics, zinc such as Coldeze or Xicam. Treat fevers as needed.

## 2020-07-17 NOTE — Assessment & Plan Note (Signed)
Referred to dermatology for evaluation. 

## 2020-07-17 NOTE — Assessment & Plan Note (Signed)
Encouraged heart healthy diet, increase exercise, avoid trans fats, consider a krill oil cap daily. Tolerating Simvastatin.  

## 2020-07-17 NOTE — Assessment & Plan Note (Signed)
Unfortunately still smoking daily still at 1/2 to 3/4 ppd, declines any treatment options. She is encouraged to keep trying and call if she is ready for some help

## 2020-07-18 DIAGNOSIS — R350 Frequency of micturition: Secondary | ICD-10-CM | POA: Insufficient documentation

## 2020-07-18 DIAGNOSIS — D696 Thrombocytopenia, unspecified: Secondary | ICD-10-CM | POA: Insufficient documentation

## 2020-07-18 LAB — TSH: TSH: 0.7 mIU/L (ref 0.40–4.50)

## 2020-07-18 LAB — CBC
HCT: 40.9 % (ref 35.0–45.0)
Hemoglobin: 13.3 g/dL (ref 11.7–15.5)
MCH: 27.3 pg (ref 27.0–33.0)
MCHC: 32.5 g/dL (ref 32.0–36.0)
MCV: 83.8 fL (ref 80.0–100.0)
MPV: 12.8 fL — ABNORMAL HIGH (ref 7.5–12.5)
Platelets: 55 10*3/uL — ABNORMAL LOW (ref 140–400)
RBC: 4.88 10*6/uL (ref 3.80–5.10)
RDW: 12.6 % (ref 11.0–15.0)
WBC: 8.1 10*3/uL (ref 3.8–10.8)

## 2020-07-18 LAB — LIPID PANEL
Cholesterol: 154 mg/dL (ref <200)
HDL: 38 mg/dL — ABNORMAL LOW (ref >=50)
LDL Cholesterol (Calc): 95 mg/dL (calc)
Non-HDL Cholesterol (Calc): 116 mg/dL (calc) (ref <130)
Total CHOL/HDL Ratio: 4.1 (calc) (ref <5.0)
Triglycerides: 110 mg/dL (ref <150)

## 2020-07-18 LAB — URINE CULTURE
MICRO NUMBER:: 11004805
Result:: NO GROWTH
SPECIMEN QUALITY:: ADEQUATE

## 2020-07-18 LAB — COMPREHENSIVE METABOLIC PANEL
AG Ratio: 1.4 (calc) (ref 1.0–2.5)
ALT: 7 U/L (ref 6–29)
AST: 16 U/L (ref 10–35)
Albumin: 4.3 g/dL (ref 3.6–5.1)
Alkaline phosphatase (APISO): 80 U/L (ref 37–153)
BUN: 8 mg/dL (ref 7–25)
CO2: 26 mmol/L (ref 20–32)
Calcium: 9.5 mg/dL (ref 8.6–10.4)
Chloride: 103 mmol/L (ref 98–110)
Creat: 0.86 mg/dL (ref 0.60–0.93)
Globulin: 3.1 g/dL (calc) (ref 1.9–3.7)
Glucose, Bld: 89 mg/dL (ref 65–99)
Potassium: 4.8 mmol/L (ref 3.5–5.3)
Sodium: 138 mmol/L (ref 135–146)
Total Bilirubin: 0.6 mg/dL (ref 0.2–1.2)
Total Protein: 7.4 g/dL (ref 6.1–8.1)

## 2020-07-18 LAB — URINALYSIS
Bilirubin Urine: NEGATIVE
Glucose, UA: NEGATIVE
Hgb urine dipstick: NEGATIVE
Ketones, ur: NEGATIVE
Leukocytes,Ua: NEGATIVE
Nitrite: NEGATIVE
Protein, ur: NEGATIVE
Specific Gravity, Urine: 1.005 (ref 1.001–1.03)
pH: <=5 (ref 5.0–8.0)

## 2020-07-18 LAB — HEMOGLOBIN A1C
Hgb A1c MFr Bld: 5.3 % of total Hgb (ref <5.7)
Mean Plasma Glucose: 105 (calc)
eAG (mmol/L): 5.8 (calc)

## 2020-07-18 NOTE — Progress Notes (Signed)
Subjective:    Patient ID: Cynthia Hubbard, female    DOB: [birth year], 69 y.o.   MRN: [id number]  Chief Complaint  Patient presents with  . Annual Exam    possible allergy-like symptoms with cough    HPI Patient is in today for annual preventative exam and follow up on chronic medical concerns. No recent hospitalizations or febrile illness. Unfortunately she continues to smoke cigarettes. Her husband was recently treated for a respiratory illness with an antibiotic and is doing better. The patient is having increased congestion and cough this week. Denies CP/palp/SOB/HA/fevers/GI or GU c/o. Taking meds as prescribed. She is trying to stay active and maintain a heart healthy diet.   Past Medical History:  Diagnosis Date  . Chicken pox 69 yrs old  . H/O measles    3 day measles   . History of chicken pox   . HTN (hypertension) 03/16/2014  . Hyperlipidemia    weighed 30 pounds heavier- used to take crestor  . Hypertension    was 30 pounds heavier  . Measles 6 th grade   3 day measles  . Osteopenia 09/14/2015  . Other and unspecified hyperlipidemia 03/16/2014  . Retinal hemorrhage of right eye 03/16/2014  . Tobacco abuse disorder 03/16/2014    Past Surgical History:  Procedure Laterality Date  . REFRACTIVE SURGERY Right     Family History  Adopted: Yes    Social History   Socioeconomic History  . Marital status: Married    Spouse name: Not on file  . Number of children: Not on file  . Years of education: Not on file  . Highest education level: Not on file  Occupational History  . Not on file  Tobacco Use  . Smoking status: Current Every Day Smoker    Packs/day: 0.75    Years: 40.00    Pack years: 30.00    Types: Cigarettes  . Smokeless tobacco: Never Used  . Tobacco comment: pt declines cessation material  Substance and Sexual Activity  . Alcohol use: Yes    Comment: very seldom  . Drug use: No  . Sexual activity: Yes    Comment: lives with husband, no dietary  restrictions, works part time at [employer]  Other Topics Concern  . Not on file  Social History Narrative  . Not on file   Social Determinants of Health   Financial Resource Strain:   . Difficulty of Paying Living Expenses: Not on file  Food Insecurity:   . Worried About Programme researcher, broadcasting/film/video in the Last Year: Not on file  . Ran Out of Food in the Last Year: Not on file  Transportation Needs:   . Lack of Transportation (Medical): Not on file  . Lack of Transportation (Non-Medical): Not on file  Physical Activity:   . Days of Exercise per Week: Not on file  . Minutes of Exercise per Session: Not on file  Stress:   . Feeling of Stress : Not on file  Social Connections:   . Frequency of Communication with Friends and Family: Not on file  . Frequency of Social Gatherings with Friends and Family: Not on file  . Attends Religious Services: Not on file  . Active Member of Clubs or Organizations: Not on file  . Attends Banker Meetings: Not on file  . Marital Status: Not on file  Intimate Partner Violence:   . Fear of Current or Ex-Partner: Not on file  . Emotionally Abused: Not on  file  . Physically Abused: Not on file  . Sexually Abused: Not on file    Outpatient Medications Prior to Visit  Medication Sig Dispense Refill  . Cholecalciferol (VITAMIN D3) 5000 UNITS CAPS Take 1 capsule by mouth daily.    Marland Kitchen FOLIC ACID PO Take by mouth.    Marland Kitchen lisinopril (ZESTRIL) 5 MG tablet TAKE 1 TABLET BY MOUTH TWICE A DAY 180 tablet 1  . OVER THE COUNTER MEDICATION Mega red daily    . Probiotic Product (PROBIOTIC DAILY PO) Take by mouth daily.    . simvastatin (ZOCOR) 10 MG tablet TAKE 1 TABLET BY MOUTH EVERY DAY 90 tablet 1  . Multiple Vitamins-Minerals (ZINC PO) Take by mouth.     No facility-administered medications prior to visit.    No Known Allergies  Review of Systems  Constitutional: Negative for chills, fever and malaise/fatigue.  HENT: Positive for congestion.  Negative for hearing loss.   Eyes: Negative for discharge.  Respiratory: Positive for cough. Negative for sputum production and shortness of breath.   Cardiovascular: Negative for chest pain, palpitations and leg swelling.  Gastrointestinal: Negative for abdominal pain, blood in stool, constipation, diarrhea, heartburn, nausea and vomiting.  Genitourinary: Negative for dysuria, frequency, hematuria and urgency.  Musculoskeletal: Negative for back pain, falls and myalgias.  Skin: Negative for rash.  Neurological: Negative for dizziness, sensory change, loss of consciousness, weakness and headaches.  Endo/Heme/Allergies: Negative for environmental allergies. Does not bruise/bleed easily.  Psychiatric/Behavioral: Negative for depression and suicidal ideas. The patient is nervous/anxious. The patient does not have insomnia.        Objective:    Physical Exam Constitutional:      General: She is not in acute distress.    Appearance: She is not diaphoretic.  HENT:     Head: Normocephalic and atraumatic.     Right Ear: External ear normal.     Left Ear: External ear normal.     Nose: Nose normal.     Mouth/Throat:     Pharynx: No oropharyngeal exudate.  Eyes:     General: No scleral icterus.       Right eye: No discharge.        Left eye: No discharge.     Conjunctiva/sclera: Conjunctivae normal.     Pupils: Pupils are equal, round, and reactive to light.  Neck:     Thyroid: No thyromegaly.  Cardiovascular:     Rate and Rhythm: Normal rate and regular rhythm.     Heart sounds: Normal heart sounds. No murmur heard.   Pulmonary:     Effort: Pulmonary effort is normal. No respiratory distress.     Breath sounds: Normal breath sounds. No wheezing or rales.  Abdominal:     General: Bowel sounds are normal. There is no distension.     Palpations: Abdomen is soft. There is no mass.     Tenderness: There is no abdominal tenderness.  Musculoskeletal:        General: No tenderness.  Normal range of motion.     Cervical back: Normal range of motion and neck supple.  Lymphadenopathy:     Cervical: No cervical adenopathy.  Skin:    General: Skin is warm and dry.     Findings: No rash.     Comments: 2 inch scratch left arm  Neurological:     Mental Status: She is alert and oriented to person, place, and time.     Cranial Nerves: No cranial nerve deficit.  Coordination: Coordination normal.     Deep Tendon Reflexes: Reflexes are normal and symmetric. Reflexes normal.     BP 121/70 (BP Location: Left Arm, Patient Position: Sitting, Cuff Size: Small)   Pulse 77   Temp 98.2 F (36.8 C) (Oral)   Resp 12   Ht 5\' 4"  (1.626 m)   Wt 140 lb 6.4 oz (63.7 kg)   SpO2 99%   BMI 24.10 kg/m  Wt Readings from Last 3 Encounters:  07/17/20 140 lb 6.4 oz (63.7 kg)  01/10/20 143 lb (64.9 kg)  06/09/19 144 lb 6.4 oz (65.5 kg)    Diabetic Foot Exam - Simple   No data filed     Lab Results  Component Value Date   WBC 8.1 07/17/2020   HGB 13.3 07/17/2020   HCT 40.9 07/17/2020   PLT 55 (L) 07/17/2020   GLUCOSE 89 07/17/2020   CHOL 154 07/17/2020   TRIG 110 07/17/2020   HDL 38 (L) 07/17/2020   LDLCALC 95 07/17/2020   ALT 7 07/17/2020   AST 16 07/17/2020   NA 138 07/17/2020   K 4.8 07/17/2020   CL 103 07/17/2020   CREATININE 0.86 07/17/2020   BUN 8 07/17/2020   CO2 26 07/17/2020   TSH 0.70 07/17/2020   HGBA1C 5.3 07/17/2020    Lab Results  Component Value Date   TSH 0.70 07/17/2020   Lab Results  Component Value Date   WBC 8.1 07/17/2020   HGB 13.3 07/17/2020   HCT 40.9 07/17/2020   MCV 83.8 07/17/2020   PLT 55 (L) 07/17/2020   Lab Results  Component Value Date   NA 138 07/17/2020   K 4.8 07/17/2020   CO2 26 07/17/2020   GLUCOSE 89 07/17/2020   BUN 8 07/17/2020   CREATININE 0.86 07/17/2020   BILITOT 0.6 07/17/2020   ALKPHOS 90 01/16/2020   AST 16 07/17/2020   ALT 7 07/17/2020   PROT 7.4 07/17/2020   ALBUMIN 4.4 01/16/2020   CALCIUM 9.5  07/17/2020   GFR 58.06 (L) 01/16/2020   Lab Results  Component Value Date   CHOL 154 07/17/2020   Lab Results  Component Value Date   HDL 38 (L) 07/17/2020   Lab Results  Component Value Date   LDLCALC 95 07/17/2020   Lab Results  Component Value Date   TRIG 110 07/17/2020   Lab Results  Component Value Date   CHOLHDL 4.1 07/17/2020   Lab Results  Component Value Date   HGBA1C 5.3 07/17/2020       Assessment & Plan:   Problem List Items Addressed This Visit    HTN (hypertension) (Chronic)    Well controlled, no changes to meds. Encouraged heart healthy diet such as the DASH diet and exercise as tolerated.       Relevant Orders   CBC (Completed)   Comprehensive metabolic panel (Completed)   TSH (Completed)   Hyperlipidemia, mixed    Encouraged heart healthy diet, increase exercise, avoid trans fats, consider a krill oil cap daily. Tolerating Simvastatin      Relevant Orders   Lipid panel (Completed)   Tobacco use disorder    Unfortunately still smoking daily still at 1/2 to 3/4 ppd, declines any treatment options. She is encouraged to keep trying and call if she is ready for some help      Preventative health care    Patient encouraged to maintain heart healthy diet, regular exercise, adequate sleep. Consider daily probiotics. Take medications as prescribed. Labs  ordered and reviewed. Declines colonoscopy or any stool testing including Cologuard and she is aware her risk of colon cancer as a smoker. [facility] 08/03/2018 [facility] in 1-2 years[facility] has a repeat ordered for next month already. Pap was 10/29/2018 repeat in 2 more years.       Sinusitis    Husband recently treated with antibiotics for a respiratory infection and improved. She is allowed a course of Amoxicillin 500 mg po tid x 7 days. Encouraged increased rest and hydration, add probiotics, zinc such as Coldeze or Xicam. Treat fevers as needed.       Relevant Medications   amoxicillin  (AMOXIL) 500 MG capsule   Hyperglycemia    hgba1c acceptable, minimize simple carbs. Increase exercise as tolerated. Continue current meds      Relevant Orders   Hemoglobin A1c (Completed)   Allergies    She always has congestion, eye symptoms and PND this time of year each year and symptoms started again yesterday. She took some zyrtec has not helped       Sun-damaged skin - Primary    Referred to dermatology for evaluation      Relevant Orders   Ambulatory referral to Dermatology   Arm skin lesion, left    Clean daily with mild soap and water and keep clean and dry. Given Tdap today      Urinary frequency   Relevant Orders   Urinalysis (Completed)   Urine Culture   Thrombocytopenia (HCC)    Recurrent and asymptomatic, repeat CBC to monitor       Other Visit Diagnoses    Need for diphtheria-tetanus-pertussis (Tdap) vaccine       Relevant Orders   Tdap vaccine greater than or equal to 7yo IM (Completed)      I am having Rosamaria Lintsheryl Wahba start on amoxicillin. I am also having her maintain her OVER THE COUNTER MEDICATION, Vitamin D3, Probiotic Product (PROBIOTIC DAILY PO), Multiple Vitamins-Minerals (ZINC PO), FOLIC ACID PO, lisinopril, and simvastatin.  Meds ordered this encounter  Medications  . amoxicillin (AMOXIL) 500 MG capsule    Sig: Take 1 capsule (500 mg total) by mouth 3 (three) times daily.    Dispense:  21 capsule    Refill:  0     Cynthia EdgeStacey English Tomer, MD

## 2020-07-18 NOTE — Assessment & Plan Note (Signed)
Recurrent and asymptomatic, repeat CBC to monitor

## 2020-07-20 ENCOUNTER — Other Ambulatory Visit: Payer: Medicare HMO

## 2020-07-20 ENCOUNTER — Other Ambulatory Visit: Payer: Self-pay

## 2020-07-20 DIAGNOSIS — D696 Thrombocytopenia, unspecified: Secondary | ICD-10-CM | POA: Diagnosis not present

## 2020-07-20 LAB — CBC WITH DIFFERENTIAL/PLATELET
Absolute Monocytes: 462 {cells}/uL (ref 200–950)
Basophils Absolute: 7 {cells}/uL (ref 0–200)
Basophils Relative: 0.1 %
Eosinophils Absolute: 143 {cells}/uL (ref 15–500)
Eosinophils Relative: 2.1 %
HCT: 39 % (ref 35.0–45.0)
Hemoglobin: 12.9 g/dL (ref 11.7–15.5)
Lymphs Abs: 918 {cells}/uL (ref 850–3900)
MCH: 27.7 pg (ref 27.0–33.0)
MCHC: 33.1 g/dL (ref 32.0–36.0)
MCV: 83.7 fL (ref 80.0–100.0)
MPV: 12.2 fL (ref 7.5–12.5)
Monocytes Relative: 6.8 %
Neutro Abs: 5270 {cells}/uL (ref 1500–7800)
Neutrophils Relative %: 77.5 %
Platelets: 97 Thousand/uL — ABNORMAL LOW (ref 140–400)
RBC: 4.66 Million/uL (ref 3.80–5.10)
RDW: 12.2 % (ref 11.0–15.0)
Total Lymphocyte: 13.5 %
WBC: 6.8 Thousand/uL (ref 3.8–10.8)

## 2020-07-23 ENCOUNTER — Other Ambulatory Visit: Payer: Self-pay

## 2020-07-23 ENCOUNTER — Telehealth: Payer: Self-pay

## 2020-07-23 DIAGNOSIS — D696 Thrombocytopenia, unspecified: Secondary | ICD-10-CM

## 2020-07-23 NOTE — Telephone Encounter (Signed)
She will need a COVID test. If negative we can consider other treatment and/or VV

## 2020-07-23 NOTE — Telephone Encounter (Signed)
Patient states is experiencing sinus-like symptoms with cough and post nasal drip.  Patient is currently taking antibiotics, with 2 days remaining and there's no improvement.

## 2020-07-24 NOTE — Telephone Encounter (Signed)
Called patient and patient will be testing for Covid and will call back with results.

## 2020-07-25 ENCOUNTER — Other Ambulatory Visit: Payer: Self-pay | Admitting: Family Medicine

## 2020-07-25 ENCOUNTER — Telehealth: Payer: Self-pay | Admitting: Family Medicine

## 2020-07-25 DIAGNOSIS — Z03818 Encounter for observation for suspected exposure to other biological agents ruled out: Secondary | ICD-10-CM | POA: Diagnosis not present

## 2020-07-25 MED ORDER — DOXYCYCLINE HYCLATE 100 MG PO TABS: 100.0000 mg | ORAL_TABLET | Freq: Two times a day (BID) | ORAL | 0 refills | Status: DC

## 2020-07-25 NOTE — Telephone Encounter (Signed)
Patient notified

## 2020-07-25 NOTE — Telephone Encounter (Signed)
Patient had a rapid covid test done today negative. They also did a send out she should get the results in the next few days. Patient states she would like something for her sinus.

## 2020-07-25 NOTE — Telephone Encounter (Signed)
I sent Doxycycline into her pharmacy she should take Mucinex bid with it and a probiotic

## 2020-07-26 NOTE — Telephone Encounter (Signed)
Patient states her covid test came back negative.

## 2020-08-14 DIAGNOSIS — Z1231 Encounter for screening mammogram for malignant neoplasm of breast: Secondary | ICD-10-CM | POA: Diagnosis not present

## 2020-08-14 LAB — HM MAMMOGRAPHY

## 2020-08-18 ENCOUNTER — Ambulatory Visit: Payer: Medicare HMO | Attending: Internal Medicine

## 2020-08-18 DIAGNOSIS — Z23 Encounter for immunization: Secondary | ICD-10-CM

## 2020-08-18 NOTE — Progress Notes (Signed)
   Cov[facility] Vaccination [facility]  Name:  Cynthia Hubbard    MRN: [id number] DOB: [birth year]  08/18/2020  [name] was observed post Covid-19 immunization for 15 minutes without incident. She was provided with Vaccine Information Sheet and instruction to access the V-Safe system.   [name] was instructed to call 911 with any severe reactions post vaccine: Marland Kitchen Difficulty breathing  . Swelling of face and throat  . A fast heartbeat  . A bad rash all over body  . Dizziness and weakness

## 2020-08-19 ENCOUNTER — Other Ambulatory Visit: Payer: Self-pay | Admitting: Family Medicine

## 2020-08-22 ENCOUNTER — Encounter: Payer: Self-pay | Admitting: Family Medicine

## 2020-08-22 NOTE — Addendum Note (Signed)
Addended by: Mervin Kung A on: 08/22/2020 08:19 AM   Modules accepted: Orders

## 2020-08-23 ENCOUNTER — Other Ambulatory Visit (INDEPENDENT_AMBULATORY_CARE_PROVIDER_SITE_OTHER): Payer: Medicare HMO

## 2020-08-23 ENCOUNTER — Other Ambulatory Visit: Payer: Self-pay | Admitting: Family Medicine

## 2020-08-23 ENCOUNTER — Other Ambulatory Visit: Payer: Self-pay

## 2020-08-23 DIAGNOSIS — D696 Thrombocytopenia, unspecified: Secondary | ICD-10-CM | POA: Diagnosis not present

## 2020-08-23 LAB — CBC
HCT: 39.5 % (ref 35.0–45.0)
Hemoglobin: 12.7 g/dL (ref 11.7–15.5)
MCH: 26.9 pg — ABNORMAL LOW (ref 27.0–33.0)
MCHC: 32.2 g/dL (ref 32.0–36.0)
MCV: 83.7 fL (ref 80.0–100.0)
MPV: 11.6 fL (ref 7.5–12.5)
Platelets: 151 10*3/uL (ref 140–400)
RBC: 4.72 10*6/uL (ref 3.80–5.10)
RDW: 12.7 % (ref 11.0–15.0)
WBC: 9 10*3/uL (ref 3.8–10.8)

## 2020-08-23 MED ORDER — DOXYCYCLINE HYCLATE 100 MG PO TABS
100.0000 mg | ORAL_TABLET | Freq: Two times a day (BID) | ORAL | 0 refills | Status: DC
Start: 2020-08-23 — End: 2021-01-17

## 2020-08-23 MED ORDER — PREDNISONE 20 MG PO TABS: 20.0000 mg | ORAL_TABLET | Freq: Two times a day (BID) | ORAL | 1 refills | Status: DC

## 2020-08-24 DIAGNOSIS — R69 Illness, unspecified: Secondary | ICD-10-CM | POA: Diagnosis not present

## 2020-08-28 DIAGNOSIS — Z1283 Encounter for screening for malignant neoplasm of skin: Secondary | ICD-10-CM | POA: Diagnosis not present

## 2020-08-28 DIAGNOSIS — L304 Erythema intertrigo: Secondary | ICD-10-CM | POA: Diagnosis not present

## 2020-08-28 DIAGNOSIS — L720 Epidermal cyst: Secondary | ICD-10-CM | POA: Diagnosis not present

## 2020-09-26 DIAGNOSIS — H35341 Macular cyst, hole, or pseudohole, right eye: Secondary | ICD-10-CM | POA: Diagnosis not present

## 2020-09-26 DIAGNOSIS — H2513 Age-related nuclear cataract, bilateral: Secondary | ICD-10-CM | POA: Diagnosis not present

## 2020-09-26 DIAGNOSIS — H35371 Puckering of macula, right eye: Secondary | ICD-10-CM | POA: Diagnosis not present

## 2021-01-17 ENCOUNTER — Other Ambulatory Visit: Payer: Self-pay

## 2021-01-17 ENCOUNTER — Encounter: Payer: Self-pay | Admitting: Family Medicine

## 2021-01-17 ENCOUNTER — Ambulatory Visit (INDEPENDENT_AMBULATORY_CARE_PROVIDER_SITE_OTHER): Payer: Medicare HMO | Admitting: Family Medicine

## 2021-01-17 DIAGNOSIS — I10 Essential (primary) hypertension: Secondary | ICD-10-CM | POA: Diagnosis not present

## 2021-01-17 DIAGNOSIS — F17200 Nicotine dependence, unspecified, uncomplicated: Secondary | ICD-10-CM | POA: Diagnosis not present

## 2021-01-17 DIAGNOSIS — D696 Thrombocytopenia, unspecified: Secondary | ICD-10-CM

## 2021-01-17 DIAGNOSIS — F338 Other recurrent depressive disorders: Secondary | ICD-10-CM | POA: Insufficient documentation

## 2021-01-17 DIAGNOSIS — R69 Illness, unspecified: Secondary | ICD-10-CM | POA: Diagnosis not present

## 2021-01-17 DIAGNOSIS — E782 Mixed hyperlipidemia: Secondary | ICD-10-CM | POA: Diagnosis not present

## 2021-01-17 DIAGNOSIS — R739 Hyperglycemia, unspecified: Secondary | ICD-10-CM | POA: Diagnosis not present

## 2021-01-17 LAB — CBC
HCT: 40.1 % (ref 36.0–46.0)
Hemoglobin: 13.2 g/dL (ref 12.0–15.0)
MCHC: 32.8 g/dL (ref 30.0–36.0)
MCV: 82 fl (ref 78.0–100.0)
Platelets: 152 10*3/uL (ref 150.0–400.0)
RBC: 4.89 Mil/uL (ref 3.87–5.11)
RDW: 13.6 % (ref 11.5–15.5)
WBC: 6 10*3/uL (ref 4.0–10.5)

## 2021-01-17 LAB — LIPID PANEL
Cholesterol: 205 mg/dL — ABNORMAL HIGH (ref 0–200)
HDL: 52 mg/dL (ref >39.00)
LDL Cholesterol: 134 mg/dL — ABNORMAL HIGH (ref 0–99)
NonHDL: 152.84
Total CHOL/HDL Ratio: 4
Triglycerides: 92 mg/dL (ref 0.0–149.0)
VLDL: 18.4 mg/dL (ref 0.0–40.0)

## 2021-01-17 LAB — COMPREHENSIVE METABOLIC PANEL
ALT: 12 U/L (ref 0–35)
AST: 18 U/L (ref 0–37)
Albumin: 4.4 g/dL (ref 3.5–5.2)
Alkaline Phosphatase: 84 U/L (ref 39–117)
BUN: 15 mg/dL (ref 6–23)
CO2: 30 mEq/L (ref 19–32)
Calcium: 9.7 mg/dL (ref 8.4–10.5)
Chloride: 104 mEq/L (ref 96–112)
Creatinine, Ser: 0.85 mg/dL (ref 0.40–1.20)
GFR: 68.84 mL/min (ref >60.00)
Glucose, Bld: 83 mg/dL (ref 70–99)
Potassium: 4.9 mEq/L (ref 3.5–5.1)
Sodium: 139 mEq/L (ref 135–145)
Total Bilirubin: 0.6 mg/dL (ref 0.2–1.2)
Total Protein: 7.5 g/dL (ref 6.0–8.3)

## 2021-01-17 LAB — HEMOGLOBIN A1C: Hgb A1c MFr Bld: 5.9 % (ref 4.6–6.5)

## 2021-01-17 LAB — TSH: TSH: 1.42 u[IU]/mL (ref 0.35–4.50)

## 2021-01-17 NOTE — Assessment & Plan Note (Signed)
Encouraged to try Varilux lights. She declines meds or counseling at presents.

## 2021-01-17 NOTE — Assessment & Plan Note (Signed)
Was almost off and then with stress she is back pu to 12 cigarettes a day. She is ready to keep trying. Encouraged complete cessation. Discussed need to quit as relates to risk of numerous cancers, cardiac and pulmonary disease as well as neurologic complications. Counseled for greater than 3 minutes

## 2021-01-17 NOTE — Patient Instructions (Signed)
Varilux lights for SAD   Seasonal Affective Disorder Seasonal affective disorder (SAD) is a type of depression. It is when you feel depressed at specific times of the year. SAD is most common during late fall and winter when the days are shorter and most people spend less time outdoors. This is why SAD is also known as the "winter blues." SAD occurs less commonly in the spring or summer. SAD can be mild to severe, and it can interfere with work, school, relationships, and normal daily activities. What are the causes? The cause of this condition is not known. It may be related to changes in brain chemistry that are caused by having less exposure to daylight. What increases the risk? You are more likely to develop this condition if:  You are female.  You live far [place] or far [place] of the equator. These areas get less sunlight and have longer winter seasons.  You have a personal history of depression or bipolar disorder.  You have a family history of mental health conditions. What are the signs or symptoms? Symptoms of this condition include:  Depressed mood, which may involve: ? Feeling sad or teary. ? Having crying spells.  Irritability.  Trouble sleeping, or sleeping more than usual.  Loss of interest in activities that you usually enjoy.  Feelings of guilt or worthlessness.  Restlessness or loss of energy.  Difficulty concentrating, remembering, or making decisions.  Significant change in appetite or weight.  Thinking about self-harm or attempting suicide. Symptoms associated with the winter pattern of SAD include:  Overeating or craving sweet foods.  Weight gain.  Avoiding social situations (social withdrawal), or feeling like "hibernating."  Sleeping more than usual. Symptoms associated with the less common summer pattern of SAD include:  Loss of appetite.  Weight loss.  Trouble sleeping.  Episodes of violent behavior (in severe cases). How is this  diagnosed? This condition is usually diagnosed through an assessment with your health care provider. You will be asked about your moods, thoughts, and behaviors. You will also be asked about your medical history, any major life changes, and any medicines and substances that you use. You may have a physical exam and blood tests to rule out other possible causes of your symptoms. You may be referred to a mental health specialist for more evaluation. How is this treated? Treatment for this condition may include:  Light therapy. This therapy involves sitting in front of a light source for 15-30 minutes every day. The light source may be: ? A light box. ? A dawn simulator or sunrise clock. This is a timer-activated light source that copies the sunrise by slowly becoming brighter. This can help to activate your body's internal clock.  Antidepressant medicine.  Cognitive behavioral therapy (CBT). CBT is a form of talk therapy that helps to identify and change negative thoughts that are associated with SAD.  Changes to your dietary, exercise, or sleeping habits. A healthy lifestyle may help to prevent or relieve symptoms.   Follow these instructions at home: Medicines  Take over-the-counter and prescription medicines only as told by your healthcare provider.  If you are taking antidepressant medicines, ask your health care provider what side effects you should be aware of.  Talk with your health care provider before you start taking any new prescription or over-the-counter medicines, herbs, or supplements. Lifestyle  Eat a healthy diet that includes fruits and vegetables, whole grains, and lean proteins.  Get plenty of sleep. To improve your sleep, make sure  you: ? Keep your bedroom dark and cool. ? Go to sleep and wake up at about the same time every day. ? Do not keep screens (such as a TV or smartphone) in your bedroom. Limit your screen time starting a few hours before bedtime.  Exercise  regularly.  Limit alcohol and caffeine as told by your health care provider. General instructions  Make your home and work environment as sunny or bright as possible. Open window blinds and move furniture closer to windows.  Spend as much time outside as possible.  Use light therapy for 15-30 minutes every day, or as often as directed.  Attend CBT therapy sessions as directed.  Keep all follow-up visits as told by your health care provider and therapist. This is important. Contact a health care provider if:  Your symptoms do not get better or they get worse.  You have trouble taking care of yourself.  You are using drugs or alcohol to cope with your symptoms.  You have side effects from medicines. Get help right away if:  You have thoughts about hurting yourself or others. If you ever feel like you may hurt yourself or others, or have thoughts about taking your own life, get help right away. You can go to your nearest emergency department or call:  Your local emergency services (911 in the [place]  A suicide crisis helpline, such as the National Suicide Prevention Lifeline at (929)353-1308. This is open 24 hours a day. Summary  Seasonal affective disorder (SAD) is a type of depression that is associated with specific times of the year (usually fall and winter).  This condition may be treated with light therapy, talk therapy, and antidepressant medicines.  To help treat your condition, take good care of yourself and make home and work as sunny and bright as possible.  Seek help right away if you have thoughts about hurting yourself or others. This information is not intended to replace advice given to you by your health care provider. Make sure you discuss any questions you have with your health care provider. Document Revised: 03/29/2020 Document Reviewed: 03/29/2020 Elsevier Patient Education  2021 [employer].

## 2021-01-17 NOTE — Assessment & Plan Note (Signed)
Encouraged heart healthy diet such as the DASH diet and exercise as tolerated.  

## 2021-01-17 NOTE — Assessment & Plan Note (Signed)
Continue to monitor

## 2021-01-17 NOTE — Assessment & Plan Note (Signed)
Tolerating statin, encouraged heart healthy diet, avoid trans fats, minimize simple carbs and saturated fats. Increase exercise as tolerated 

## 2021-01-17 NOTE — Progress Notes (Signed)
Patient ID: Cynthia Hubbard, female    DOB: [birth year]  Age: 70 y.o. MRN: [id number]    Subjective:  Subjective  HPI Der Gagliano presents for office visit today. She reports not feeling that great because she has gained some weight and increased frequency of smoking when previously she was trying to smoke less. She states that she is doing gardening and participating in more physical activities. She states that she smokes about more than half a pack (12 cigarettes). She reports that she has been eating more sweets and it causes her to feel lethargic. Although, she is trying to improve because she reports that last night she had cooked salmon with little olive oil for seasoning. She denies any chest pain, SOB, fever, abdominal pain, cough, chills, sore throat, dysuria, urinary incontinence, back pain, HA, or N/VD. She denies using artifical sweeteners in her diet. She reports that her husband has had contracts surgery in one of his eye and states that he is doing better now. She reports that several of her friends had been dx with breast cancer and it has been affecting her emotionally and mentally. She reports that she is experiencing SAD due to the cold weather and not being able to go outside. She reports experiencing night sweats and hot flashes, which she thinks it might be due to her sleeping under too many layers or leaving the heat at too high of a temperature.   Review of Systems  Constitutional: Negative for chills, fatigue and fever.  HENT: Negative for congestion, rhinorrhea, sinus pressure, sinus pain and sore throat.   Eyes: Negative for pain.  Respiratory: Negative for cough and shortness of breath.   Cardiovascular: Negative for chest pain, palpitations and leg swelling.  Gastrointestinal: Negative for abdominal pain, blood in stool, diarrhea, nausea and vomiting.  Genitourinary: Negative for decreased urine volume, flank pain, frequency, vaginal bleeding and vaginal discharge.   Musculoskeletal: Negative for back pain.  Neurological: Negative for headaches.    History Past Medical History:  Diagnosis Date  . Chicken pox 70 yrs old  . H/O measles    3 day measles   . History of chicken pox   . HTN (hypertension) 03/16/2014  . Hyperlipidemia    weighed 30 pounds heavier- used to take crestor  . Hypertension    was 30 pounds heavier  . Measles 6 th grade   3 day measles  . Osteopenia 09/14/2015  . Other and unspecified hyperlipidemia 03/16/2014  . Retinal hemorrhage of right eye 03/16/2014  . Tobacco abuse disorder 03/16/2014    She has a past surgical history that includes Refractive surgery (Right).   Her family history is not on file. She was adopted.She reports that she has been smoking cigarettes. She has a 30.00 pack-year smoking history. She has never used smokeless tobacco. She reports current alcohol use. She reports that she does not use drugs.  Current Outpatient Medications on File Prior to Visit  Medication Sig Dispense Refill  . Cholecalciferol (VITAMIN D3) 5000 UNITS CAPS Take 1 capsule by mouth daily.    Marland Kitchen FOLIC ACID PO Take by mouth.    Marland Kitchen lisinopril (ZESTRIL) 5 MG tablet TAKE 1 TABLET BY MOUTH TWICE A DAY 180 tablet 1  . OVER THE COUNTER MEDICATION Mega red daily    . Probiotic Product (PROBIOTIC DAILY PO) Take by mouth daily.    . simvastatin (ZOCOR) 10 MG tablet TAKE 1 TABLET BY MOUTH EVERY DAY 90 tablet 1  . Multiple Vitamins-Minerals (ZINC PO)  Take by mouth.     No current facility-administered medications on file prior to visit.     Objective:  Objective  Physical Exam Constitutional:      General: She is not in acute distress.    Appearance: Normal appearance. She is not ill-appearing or toxic-appearing.  HENT:     Head: Normocephalic and atraumatic.     Right Ear: Tympanic membrane, ear canal and external ear normal.     Left Ear: Tympanic membrane, ear canal and external ear normal.     Nose: No congestion or rhinorrhea.   Eyes:     Extraocular Movements: Extraocular movements intact.     Pupils: Pupils are equal, round, and reactive to light.  Cardiovascular:     Rate and Rhythm: Normal rate and regular rhythm.     Pulses: Normal pulses.     Heart sounds: Normal heart sounds. No murmur heard.   Pulmonary:     Effort: Pulmonary effort is normal. No respiratory distress.     Breath sounds: Normal breath sounds. No wheezing, rhonchi or rales.  Abdominal:     General: Bowel sounds are normal.     Palpations: Abdomen is soft. There is no mass.     Tenderness: There is no abdominal tenderness. There is no guarding.     Hernia: No hernia is present.  Musculoskeletal:        General: Normal range of motion.     Cervical back: Normal range of motion and neck supple.  Skin:    General: Skin is warm and dry.  Neurological:     Mental Status: She is alert and oriented to person, place, and time.  Psychiatric:        Behavior: Behavior normal.    BP 122/74   Pulse 79   Temp 97.9 F (36.6 C)   Resp 16   Wt 142 lb (64.4 kg)   SpO2 96%   BMI 24.37 kg/m  Wt Readings from Last 3 Encounters:  01/17/21 142 lb (64.4 kg)  07/17/20 140 lb 6.4 oz (63.7 kg)  01/10/20 143 lb (64.9 kg)     Lab Results  Component Value Date   WBC 6.0 01/17/2021   HGB 13.2 01/17/2021   HCT 40.1 01/17/2021   PLT 152.0 01/17/2021   GLUCOSE 83 01/17/2021   CHOL 205 (H) 01/17/2021   TRIG 92.0 01/17/2021   HDL 52.00 01/17/2021   LDLCALC 134 (H) 01/17/2021   ALT 12 01/17/2021   AST 18 01/17/2021   NA 139 01/17/2021   K 4.9 01/17/2021   CL 104 01/17/2021   CREATININE 0.85 01/17/2021   BUN 15 01/17/2021   CO2 30 01/17/2021   TSH 1.42 01/17/2021   HGBA1C 5.9 01/17/2021    No results found.   Assessment & Plan:  Plan    No orders of the defined types were placed in this encounter.   Problem List Items Addressed This Visit    HTN (hypertension) (Chronic)    Encouraged heart healthy diet such as the DASH diet  and exercise as tolerated.       Relevant Orders   CBC (Completed)   Comprehensive metabolic panel (Completed)   TSH (Completed)   Hyperlipidemia, mixed    Tolerating statin, encouraged heart healthy diet, avoid trans fats, minimize simple carbs and saturated fats. Increase exercise as tolerated      Relevant Orders   Lipid panel (Completed)   Tobacco use disorder    Was almost off and then  with stress she is back pu to 12 cigarettes a day. She is ready to keep trying. Encouraged complete cessation. Discussed need to quit as relates to risk of numerous cancers, cardiac and pulmonary disease as well as neurologic complications. Counseled for greater than 3 minutes      Hyperglycemia    hgba1c acceptable, minimize simple carbs. Increase exercise as tolerated.      Relevant Orders   Hemoglobin A1c (Completed)   Thrombocytopenia (HCC)    Continue to monitor      Seasonal affective disorder (HCC)    Encouraged to try Varilux lights. She declines meds or counseling at presents.          Follow-up: Return in about 6 months (around 07/19/2021) for annual exam.   I,David Hanna,acting as a scribe for Danise Edge, MD.,have documented all relevant documentation on the behalf of Danise Edge, MD,as directed by  Danise Edge, MD while in the presence of Danise Edge, MD.  I, Bradd Canary, MD personally performed the services described in this documentation. All medical record entries made by the scribe were at my direction and in my presence. I have reviewed the chart and agree that the record reflects my personal performance and is accurate and complete

## 2021-01-17 NOTE — Assessment & Plan Note (Signed)
hgba1c acceptable, minimize simple carbs. Increase exercise as tolerated.  

## 2021-01-21 ENCOUNTER — Other Ambulatory Visit: Payer: Self-pay | Admitting: Family Medicine

## 2021-02-25 ENCOUNTER — Other Ambulatory Visit: Payer: Self-pay | Admitting: Family Medicine

## 2021-04-05 ENCOUNTER — Telehealth: Payer: Self-pay | Admitting: Family Medicine

## 2021-04-05 NOTE — Telephone Encounter (Signed)
error 

## 2021-04-05 NOTE — Telephone Encounter (Signed)
Copied from CRM [id number]. Topic: Medicare AWV >> Apr 05, 2021 11:37 AM Harris-Coley, Avon Gully wrote: Reason for CRM: Left message for patient to schedule Annual Wellness Visit.  Please schedule with Health Nurse Advisor Clare Gandy. at [facility].

## 2021-05-03 ENCOUNTER — Encounter: Payer: Self-pay | Admitting: Family Medicine

## 2021-06-28 ENCOUNTER — Other Ambulatory Visit: Payer: Self-pay | Admitting: Family Medicine

## 2021-08-19 DIAGNOSIS — Z1231 Encounter for screening mammogram for malignant neoplasm of breast: Secondary | ICD-10-CM | POA: Diagnosis not present

## 2021-08-19 LAB — HM MAMMOGRAPHY

## 2021-08-20 ENCOUNTER — Encounter: Payer: Self-pay | Admitting: *Deleted

## 2021-08-22 ENCOUNTER — Other Ambulatory Visit: Payer: Self-pay | Admitting: Family Medicine

## 2021-08-26 ENCOUNTER — Other Ambulatory Visit: Payer: Self-pay

## 2021-08-27 ENCOUNTER — Encounter: Payer: Self-pay | Admitting: Family Medicine

## 2021-08-27 ENCOUNTER — Ambulatory Visit (INDEPENDENT_AMBULATORY_CARE_PROVIDER_SITE_OTHER): Payer: Medicare HMO

## 2021-08-27 ENCOUNTER — Ambulatory Visit (INDEPENDENT_AMBULATORY_CARE_PROVIDER_SITE_OTHER): Payer: Medicare HMO | Admitting: Family Medicine

## 2021-08-27 VITALS — BP 120/68 | HR 59 | Temp 97.7°F | Resp 16 | Ht 64.0 in | Wt 136.8 lb

## 2021-08-27 VITALS — BP 120/68 | Temp 97.7°F | Resp 16 | Ht 64.0 in | Wt 136.0 lb

## 2021-08-27 DIAGNOSIS — I10 Essential (primary) hypertension: Secondary | ICD-10-CM

## 2021-08-27 DIAGNOSIS — M8580 Other specified disorders of bone density and structure, unspecified site: Secondary | ICD-10-CM | POA: Diagnosis not present

## 2021-08-27 DIAGNOSIS — Z0000 Encounter for general adult medical examination without abnormal findings: Secondary | ICD-10-CM | POA: Diagnosis not present

## 2021-08-27 DIAGNOSIS — R739 Hyperglycemia, unspecified: Secondary | ICD-10-CM

## 2021-08-27 DIAGNOSIS — E2839 Other primary ovarian failure: Secondary | ICD-10-CM

## 2021-08-27 DIAGNOSIS — Z780 Asymptomatic menopausal state: Secondary | ICD-10-CM

## 2021-08-27 DIAGNOSIS — R69 Illness, unspecified: Secondary | ICD-10-CM | POA: Diagnosis not present

## 2021-08-27 DIAGNOSIS — E782 Mixed hyperlipidemia: Secondary | ICD-10-CM | POA: Diagnosis not present

## 2021-08-27 DIAGNOSIS — D696 Thrombocytopenia, unspecified: Secondary | ICD-10-CM

## 2021-08-27 DIAGNOSIS — F17200 Nicotine dependence, unspecified, uncomplicated: Secondary | ICD-10-CM

## 2021-08-27 DIAGNOSIS — Z23 Encounter for immunization: Secondary | ICD-10-CM | POA: Diagnosis not present

## 2021-08-27 LAB — COMPREHENSIVE METABOLIC PANEL
ALT: 12 U/L (ref 0–35)
AST: 19 U/L (ref 0–37)
Albumin: 4.1 g/dL (ref 3.5–5.2)
Alkaline Phosphatase: 74 U/L (ref 39–117)
BUN: 18 mg/dL (ref 6–23)
CO2: 27 mEq/L (ref 19–32)
Calcium: 9.2 mg/dL (ref 8.4–10.5)
Chloride: 103 mEq/L (ref 96–112)
Creatinine, Ser: 0.83 mg/dL (ref 0.40–1.20)
GFR: 70.53 mL/min (ref >60.00)
Glucose, Bld: 85 mg/dL (ref 70–99)
Potassium: 4.4 mEq/L (ref 3.5–5.1)
Sodium: 137 mEq/L (ref 135–145)
Total Bilirubin: 0.5 mg/dL (ref 0.2–1.2)
Total Protein: 6.9 g/dL (ref 6.0–8.3)

## 2021-08-27 LAB — CBC WITH DIFFERENTIAL/PLATELET
Basophils Absolute: 0 10*3/uL (ref 0.0–0.1)
Basophils Relative: 0.4 % (ref 0.0–3.0)
Eosinophils Absolute: 0.2 10*3/uL (ref 0.0–0.7)
Eosinophils Relative: 3.6 % (ref 0.0–5.0)
HCT: 37.7 % (ref 36.0–46.0)
Hemoglobin: 12.8 g/dL (ref 12.0–15.0)
Lymphocytes Relative: 29.1 % (ref 12.0–46.0)
Lymphs Abs: 1.5 10*3/uL (ref 0.7–4.0)
MCHC: 33.8 g/dL (ref 30.0–36.0)
MCV: 83.2 fl (ref 78.0–100.0)
Monocytes Absolute: 0.2 10*3/uL (ref 0.1–1.0)
Monocytes Relative: 4.5 % (ref 3.0–12.0)
Neutro Abs: 3.2 10*3/uL (ref 1.4–7.7)
Neutrophils Relative %: 62.4 % (ref 43.0–77.0)
Platelets: 170 10*3/uL (ref 150.0–400.0)
RBC: 4.54 Mil/uL (ref 3.87–5.11)
RDW: 13 % (ref 11.5–15.5)
WBC: 5.2 10*3/uL (ref 4.0–10.5)

## 2021-08-27 LAB — TSH: TSH: 0.73 u[IU]/mL (ref 0.35–5.50)

## 2021-08-27 LAB — LIPID PANEL
Cholesterol: 139 mg/dL (ref 0–200)
HDL: 37.1 mg/dL — ABNORMAL LOW (ref >39.00)
LDL Cholesterol: 82 mg/dL (ref 0–99)
NonHDL: 102.07
Total CHOL/HDL Ratio: 4
Triglycerides: 98 mg/dL (ref 0.0–149.0)
VLDL: 19.6 mg/dL (ref 0.0–40.0)

## 2021-08-27 LAB — HEMOGLOBIN A1C: Hgb A1c MFr Bld: 5.9 % (ref 4.6–6.5)

## 2021-08-27 NOTE — Patient Instructions (Signed)
Cynthia Hubbard , Thank you for taking time to come for your Medicare Wellness Visit. I appreciate your ongoing commitment to your health goals. Please review the following plan we discussed and let me know if I can assist you in the future.   Screening recommendations/referrals: Colonoscopy: Declined Mammogram: Completed 08/19/2021 Bone Density: Ordered today by Dr. Abner Greenspan. Recommended yearly ophthalmology/optometry visit for glaucoma screening and checkup Recommended yearly dental visit for hygiene and checkup  Vaccinations: Influenza vaccine: Up to date Pneumococcal vaccine: Declined Tdap vaccine: Up to date-Due-07/17/2030 Shingles vaccine: Declined   Covid-19:Booster available at the pharmacy  Advanced directives: Copy in chart  Conditions/risks identified: See problem list  Next appointment: Follow up in one year for your annual wellness visit    Preventive Care 72 Years and Older, Female Preventive care refers to lifestyle choices and visits with your health care provider that can promote health and wellness. What does preventive care include? A yearly physical exam. This is also called an annual well check. Dental exams once or twice a year. Routine eye exams. Ask your health care provider how often you should have your eyes checked. Personal lifestyle choices, including: Daily care of your teeth and gums. Regular physical activity. Eating a healthy diet. Avoiding tobacco and drug use. Limiting alcohol use. Practicing safe sex. Taking low-dose aspirin every day. Taking vitamin and mineral supplements as recommended by your health care provider. What happens during an annual well check? The services and screenings done by your health care provider during your annual well check will depend on your age, overall health, lifestyle risk factors, and family history of disease. Counseling  Your health care provider may ask you questions about your: Alcohol use. Tobacco use. Drug  use. Emotional well-being. Home and relationship well-being. Sexual activity. Eating habits. History of falls. Memory and ability to understand (cognition). Work and work Astronomer. Reproductive health. Screening  You may have the following tests or measurements: Height, weight, and BMI. Blood pressure. Lipid and cholesterol levels. These may be checked every 5 years, or more frequently if you are over 50 years old. Skin check. Lung cancer screening. You may have this screening every year starting at age 72 if you have a 30-pack-year history of smoking and currently smoke or have quit within the past 15 years. Fecal occult blood test (FOBT) of the stool. You may have this test every year starting at age 72. Flexible sigmoidoscopy or colonoscopy. You may have a sigmoidoscopy every 5 years or a colonoscopy every 10 years starting at age 72. Hepatitis C blood test. Hepatitis B blood test. Sexually transmitted disease (STD) testing. Diabetes screening. This is done by checking your blood sugar (glucose) after you have not eaten for a while (fasting). You may have this done every 1-3 years. Bone density scan. This is done to screen for osteoporosis. You may have this done starting at age 72. Mammogram. This may be done every 1-2 years. Talk to your health care provider about how often you should have regular mammograms. Talk with your health care provider about your test results, treatment options, and if necessary, the need for more tests. Vaccines  Your health care provider may recommend certain vaccines, such as: Influenza vaccine. This is recommended every year. Tetanus, diphtheria, and acellular pertussis (Tdap, Td) vaccine. You may need a Td booster every 10 years. Zoster vaccine. You may need this after age 72. Pneumococcal 13-valent conjugate (PCV13) vaccine. One dose is recommended after age 72. Pneumococcal polysaccharide (PPSV23) vaccine. One dose  is recommended after age  72. Talk to your health care provider about which screenings and vaccines you need and how often you need them. This information is not intended to replace advice given to you by your health care provider. Make sure you discuss any questions you have with your health care provider. Document Released: 11/02/2015 Document Revised: 06/25/2016 Document Reviewed: 08/07/2015 Elsevier Interactive Patient Education  2017 [employer] Prevention in the Home Falls can cause injuries. They can happen to people of all ages. There are many things you can do to make your home safe and to help prevent falls. What can I do on the outside of my home? Regularly fix the edges of walkways and driveways and fix any cracks. Remove anything that might make you trip as you walk through a door, such as a raised step or threshold. Trim any bushes or trees on the path to your home. Use bright outdoor lighting. Clear any walking paths of anything that might make someone trip, such as rocks or tools. Regularly check to see if handrails are loose or broken. Make sure that both sides of any steps have handrails. Any raised decks and porches should have guardrails on the edges. Have any leaves, snow, or ice cleared regularly. Use sand or salt on walking paths during winter. Clean up any spills in your garage right away. This includes oil or grease spills. What can I do in the bathroom? Use night lights. Install grab bars by the toilet and in the tub and shower. Do not use towel bars as grab bars. Use non-skid mats or decals in the tub or shower. If you need to sit down in the shower, use a plastic, non-slip stool. Keep the floor dry. Clean up any water that spills on the floor as soon as it happens. Remove soap buildup in the tub or shower regularly. Attach bath mats securely with double-sided non-slip rug tape. Do not have throw rugs and other things on the floor that can make you trip. What can I do in the  bedroom? Use night lights. Make sure that you have a light by your bed that is easy to reach. Do not use any sheets or blankets that are too big for your bed. They should not hang down onto the floor. Have a firm chair that has side arms. You can use this for support while you get dressed. Do not have throw rugs and other things on the floor that can make you trip. What can I do in the kitchen? Clean up any spills right away. Avoid walking on wet floors. Keep items that you use a lot in easy-to-reach places. If you need to reach something above you, use a strong step stool that has a grab bar. Keep electrical cords out of the way. Do not use floor polish or wax that makes floors slippery. If you must use wax, use non-skid floor wax. Do not have throw rugs and other things on the floor that can make you trip. What can I do with my stairs? Do not leave any items on the stairs. Make sure that there are handrails on both sides of the stairs and use them. Fix handrails that are broken or loose. Make sure that handrails are as long as the stairways. Check any carpeting to make sure that it is firmly attached to the stairs. Fix any carpet that is loose or worn. Avoid having throw rugs at the top or bottom of the stairs.  If you do have throw rugs, attach them to the floor with carpet tape. Make sure that you have a light switch at the top of the stairs and the bottom of the stairs. If you do not have them, ask someone to add them for you. What else can I do to help prevent falls? Wear shoes that: Do not have high heels. Have rubber bottoms. Are comfortable and fit you well. Are closed at the toe. Do not wear sandals. If you use a stepladder: Make sure that it is fully opened. Do not climb a closed stepladder. Make sure that both sides of the stepladder are locked into place. Ask someone to hold it for you, if possible. Clearly mark and make sure that you can see: Any grab bars or  handrails. First and last steps. Where the edge of each step is. Use tools that help you move around (mobility aids) if they are needed. These include: Canes. Walkers. Scooters. Crutches. Turn on the lights when you go into a dark area. Replace any light bulbs as soon as they burn out. Set up your furniture so you have a clear path. Avoid moving your furniture around. If any of your floors are uneven, fix them. If there are any pets around you, be aware of where they are. Review your medicines with your doctor. Some medicines can make you feel dizzy. This can increase your chance of falling. Ask your doctor what other things that you can do to help prevent falls. This information is not intended to replace advice given to you by your health care provider. Make sure you discuss any questions you have with your health care provider. Document Released: 08/02/2009 Document Revised: 03/13/2016 Document Reviewed: 11/10/2014 Elsevier Interactive Patient Education  2017 [employer].

## 2021-08-27 NOTE — Progress Notes (Signed)
Patient ID: Cynthia Hubbard, female    DOB: [birth year]  Age: 70 y.o. MRN: [id number]    Subjective:   Chief Complaint  Patient presents with   Annual Exam   Subjective  HPI Cynthia Hubbard presents for office visit today for comprehensive physical exam today and follow up on management of chronic concerns. She is doing good and has no recent febrile illnesses or ER visits to report. However, a few weeks ago she has been experiencing HA's and recently she was doing house choirs and felt dizzy. As a result, she rested and measured her BP ranging 120/60-147-93. She reports that she is struggling with quitting smoking and currently smokes about half a pack daily. Denies CP/palp/SOB/congestion/fevers/GI or GU c/o. Taking meds as prescribed.  She endorses keeping up with taking her multivitamins, folic acid and Vit D.   Review of Systems  Constitutional:  Negative for chills, fatigue and fever.  HENT:  Negative for congestion, rhinorrhea, sinus pressure, sinus pain, sore throat and trouble swallowing.   Eyes:  Negative for pain.  Respiratory:  Negative for cough and shortness of breath.   Cardiovascular:  Negative for chest pain, palpitations and leg swelling.  Gastrointestinal:  Negative for abdominal pain, blood in stool, diarrhea, nausea and vomiting.  Genitourinary:  Negative for decreased urine volume, flank pain, frequency, vaginal bleeding and vaginal discharge.  Musculoskeletal:  Positive for back pain (mid).  Neurological:  Positive for light-headedness and headaches.   History Past Medical History:  Diagnosis Date   Chicken pox 70 yrs old   H/O measles    3 day measles    History of chicken pox    HTN (hypertension) 03/16/2014   Hyperlipidemia    weighed 30 pounds heavier- used to take crestor   Hypertension    was 30 pounds heavier   Measles 6 th grade   3 day measles   Osteopenia 09/14/2015   Other and unspecified hyperlipidemia 03/16/2014   Retinal hemorrhage of right eye  03/16/2014   Tobacco abuse disorder 03/16/2014    She has a past surgical history that includes Refractive surgery (Right).   Her family history is not on file. She was adopted.She reports that she has been smoking cigarettes. She has a 30.00 pack-year smoking history. She has never used smokeless tobacco. She reports current alcohol use. She reports that she does not use drugs.  Current Outpatient Medications on File Prior to Visit  Medication Sig Dispense Refill   Cholecalciferol (VITAMIN D3) 5000 UNITS CAPS Take 1 capsule by mouth daily.     FOLIC ACID PO Take by mouth.     lisinopril (ZESTRIL) 5 MG tablet TAKE 1 TABLET BY MOUTH TWICE A DAY 180 tablet 1   OVER THE COUNTER MEDICATION Mega red daily     Probiotic Product (PROBIOTIC DAILY PO) Take by mouth daily.     simvastatin (ZOCOR) 10 MG tablet TAKE 1 TABLET BY MOUTH EVERY DAY 90 tablet 1   No current facility-administered medications on file prior to visit.     Objective:  Objective  Physical Exam Constitutional:      General: She is not in acute distress.    Appearance: Normal appearance. She is not ill-appearing or toxic-appearing.  HENT:     Head: Normocephalic and atraumatic.     Right Ear: Tympanic membrane, ear canal and external ear normal.     Left Ear: Tympanic membrane, ear canal and external ear normal.     Nose: No congestion or rhinorrhea.  Eyes:     Extraocular Movements: Extraocular movements intact.     Right eye: No nystagmus.     Left eye: No nystagmus.     Pupils: Pupils are equal, round, and reactive to light.  Cardiovascular:     Rate and Rhythm: Normal rate and regular rhythm.     Pulses: Normal pulses.          Posterior tibial pulses are 2+ on the right side and 2+ on the left side.     Heart sounds: Normal heart sounds. No murmur heard. Pulmonary:     Effort: Pulmonary effort is normal. No respiratory distress.     Breath sounds: Normal breath sounds. No wheezing, rhonchi or rales.  Abdominal:      General: Bowel sounds are normal.     Palpations: Abdomen is soft. There is no mass.     Tenderness: There is no abdominal tenderness. There is no guarding.     Hernia: No hernia is present.  Musculoskeletal:        General: Normal range of motion.     Cervical back: Normal range of motion and neck supple.  Skin:    General: Skin is warm and dry.  Neurological:     Mental Status: She is alert and oriented to person, place, and time.     Cranial Nerves: No facial asymmetry.     Motor: Motor function is intact. No weakness.     Deep Tendon Reflexes:     Reflex Scores:      Patellar reflexes are 2+ on the right side and 2+ on the left side. Psychiatric:        Behavior: Behavior normal.   BP 120/68   Pulse (!) 59   Temp 97.7 F (36.5 C)   Resp 16   Ht 5\' 4"  (1.626 m)   Wt 136 lb 12.8 oz (62.1 kg)   SpO2 99%   BMI 23.48 kg/m  Wt Readings from Last 3 Encounters:  08/27/21 136 lb (61.7 kg)  08/27/21 136 lb 12.8 oz (62.1 kg)  01/17/21 142 lb (64.4 kg)     Lab Results  Component Value Date   WBC 5.2 08/27/2021   HGB 12.8 08/27/2021   HCT 37.7 08/27/2021   PLT 170.0 08/27/2021   GLUCOSE 85 08/27/2021   CHOL 139 08/27/2021   TRIG 98.0 08/27/2021   HDL 37.10 (L) 08/27/2021   LDLCALC 82 08/27/2021   ALT 12 08/27/2021   AST 19 08/27/2021   NA 137 08/27/2021   K 4.4 08/27/2021   CL 103 08/27/2021   CREATININE 0.83 08/27/2021   BUN 18 08/27/2021   CO2 27 08/27/2021   TSH 0.73 08/27/2021   HGBA1C 5.9 08/27/2021    No results found.   Assessment & Plan:  Plan    No orders of the defined types were placed in this encounter.   Problem List Items Addressed This Visit     HTN (hypertension) (Chronic)    Well controlled, no changes to meds. Encouraged heart healthy diet such as the DASH diet and exercise as tolerated. She had one episode of feeling dizzy when vacuuming. She acknowledges she does not hydrate well or eat protein consistently she is encouraged to  hydrate 60-80 ounces. Eat protein every 4-5 hours.       Relevant Orders   CBC with Differential/Platelet (Completed)   Comprehensive metabolic panel (Completed)   TSH (Completed)   Lipid panel (Completed)   Hyperlipidemia, mixed  Encourage heart healthy diet such as MIND or DASH diet, increase exercise, avoid trans fats, simple carbohydrates and processed foods, consider a krill or fish or flaxseed oil cap daily.       Relevant Orders   CBC with Differential/Platelet (Completed)   Comprehensive metabolic panel (Completed)   TSH (Completed)   Lipid panel (Completed)   Tobacco use disorder    She continues to smoke daily but is no longer smoking in the house      Preventative health care - Primary    Patient encouraged to maintain heart healthy diet, regular exercise, adequate sleep. Consider daily probiotics. Take medications as prescribed. Labs ordered and reviewed. Had MGM this week will request requests. Last Dexa scan 2019 repeat scan soon.      Relevant Orders   Hemoglobin A1c (Completed)   CBC with Differential/Platelet (Completed)   Comprehensive metabolic panel (Completed)   TSH (Completed)   Lipid panel (Completed)   Osteopenia    Encouraged to get adequate exercise, calcium and vitamin d intake, repeat Dexa ordered she wishes to do it at [facility]      Hyperglycemia    hgba1c acceptable, minimize simple carbs. Increase exercise as tolerated. Continue current meds      Relevant Orders   Hemoglobin A1c (Completed)   Thrombocytopenia (HCC)    Asymptomatic, continue to monitor      Other Visit Diagnoses     Need for influenza vaccination       Relevant Orders   Flu Vaccine QUAD High Dose(Fluad) (Completed)   Estrogen deficiency       Relevant Orders   DG Bone Density   Post-menopausal       Relevant Orders   DG Bone Density       Follow-up: Return in about 6 months (around 02/24/2022) for f/u visit.  I, Billie Lade, acting as a scribe for Danise Edge,  MD, have documented all relevent documentation on behalf of Danise Edge, MD, as directed by Danise Edge, MD while in the presence of Danise Edge, MD. DO:08/27/21.  I, Bradd Canary, MD personally performed the services described in this documentation. All medical record entries made by the scribe were at my direction and in my presence. I have reviewed the chart and agree that the record reflects my personal performance and is accurate and complete

## 2021-08-27 NOTE — Assessment & Plan Note (Signed)
Encourage heart healthy diet such as MIND or DASH diet, increase exercise, avoid trans fats, simple carbohydrates and processed foods, consider a krill or fish or flaxseed oil cap daily.  °

## 2021-08-27 NOTE — Patient Instructions (Addendum)
Fluids 60-80 oz daily.  Molnupiravir/Paxlovid is the new COVID medication we can give you if you get COVID so make sure you test if you have symptoms because we have to treat by day 5 of symptoms for it to be effective. If you are positive let us know so we can treat. If a home test is negative and your symptoms are persistent get a PCR test. Can check testing locations at [email or web address]  If you are positive we will make an appointment with [place] and we will send in molnupiravir/paxlovid if you would like it. Check with your pharmacy before we meet to confirm they have it in stock, if they do not then we can get the prescription at the [facility].    Preventive Care 66 Years and Older, Female Preventive care refers to lifestyle choices and visits with your health care provider that can promote health and wellness. Preventive care visits are also called wellness exams. What can I expect for my preventive care visit? Counseling Your health care provider may ask you questions about your: Medical history, including: Past medical problems. Family medical history. Pregnancy and menstrual history. History of falls. Current health, including: Memory and ability to understand (cognition). Emotional well-being. Home life and relationship well-being. Sexual activity and sexual health. Lifestyle, including: Alcohol, nicotine or tobacco, and drug use. Access to firearms. Diet, exercise, and sleep habits. Work and work Statistician. Sunscreen use. Safety issues such as seatbelt and bike helmet use. Physical exam Your health care provider will check your: Height and weight. These may be used to calculate your BMI (body mass index). BMI is a measurement that tells if you are at a healthy weight. Waist circumference. This measures the distance around your waistline. This measurement also tells if you are at a healthy weight and may help predict your risk of certain diseases, such as  type 2 diabetes and high blood pressure. Heart rate and blood pressure. Body temperature. Skin for abnormal spots. What immunizations do I need? Vaccines are usually given at various ages, according to a schedule. Your health care provider will recommend vaccines for you based on your age, medical history, and lifestyle or other factors, such as travel or where you work. What tests do I need? Screening Your health care provider may recommend screening tests for certain conditions. This may include: Lipid and cholesterol levels. Hepatitis C test. Hepatitis B test. HIV (human immunodeficiency virus) test. STI (sexually transmitted infection) testing, if you are at risk. Lung cancer screening. Colorectal cancer screening. Diabetes screening. This is done by checking your blood sugar (glucose) after you have not eaten for a while (fasting). Mammogram. Talk with your health care provider about how often you should have regular mammograms. BRCA-related cancer screening. This may be done if you have a family history of breast, ovarian, tubal, or peritoneal cancers. Bone density scan. This is done to screen for osteoporosis. Talk with your health care provider about your test results, treatment options, and if necessary, the need for more tests. Follow these instructions at home: Eating and drinking  Eat a diet that includes fresh fruits and vegetables, whole grains, lean protein, and low-fat dairy products. Limit your intake of foods with high amounts of sugar, saturated fats, and salt. Take vitamin and mineral supplements as recommended by your health care provider. Do not drink alcohol if your health care provider tells you not to drink. If you drink alcohol: Limit how much you have to 0-1 drink a day. Know  how much alcohol is in your drink. In the [place] one drink equals one 12 oz bottle of beer (355 mL), one 5 oz glass of wine (148 mL), or one 1 oz glass of hard liquor (44  mL). Lifestyle Brush your teeth every morning and night with fluoride toothpaste. Floss one time each day. Exercise for at least 30 minutes 5 or more days each week. Do not use any products that contain nicotine or tobacco. These products include cigarettes, chewing tobacco, and vaping devices, such as e-cigarettes. If you need help quitting, ask your health care provider. Do not use drugs. If you are sexually active, practice safe sex. Use a condom or other form of protection in order to prevent STIs. Take aspirin only as told by your health care provider. Make sure that you understand how much to take and what form to take. Work with your health care provider to find out whether it is safe and beneficial for you to take aspirin daily. Ask your health care provider if you need to take a cholesterol-lowering medicine (statin). Find healthy ways to manage stress, such as: Meditation, yoga, or listening to music. Journaling. Talking to a trusted person. Spending time with friends and family. Minimize exposure to UV radiation to reduce your risk of skin cancer. Safety Always wear your seat belt while driving or riding in a vehicle. Do not drive: If you have been drinking alcohol. Do not ride with someone who has been drinking. When you are tired or distracted. While texting. If you have been using any mind-altering substances or drugs. Wear a helmet and other protective equipment during sports activities. If you have firearms in your house, make sure you follow all gun safety procedures. What's next? Visit your health care provider once a year for an annual wellness visit. Ask your health care provider how often you should have your eyes and teeth checked. Stay up to date on all vaccines. This information is not intended to replace advice given to you by your health care provider. Make sure you discuss any questions you have with your health care provider. Document Revised: 04/03/2021  Document Reviewed: 04/03/2021 Elsevier Patient Education  [employer].

## 2021-08-27 NOTE — Assessment & Plan Note (Addendum)
Well controlled, no changes to meds. Encouraged heart healthy diet such as the DASH diet and exercise as tolerated. She had one episode of feeling dizzy when vacuuming. She acknowledges she does not hydrate well or eat protein consistently she is encouraged to hydrate 60-80 ounces. Eat protein every 4-5 hours.

## 2021-08-27 NOTE — Assessment & Plan Note (Addendum)
Encouraged to get adequate exercise, calcium and vitamin d intake, repeat Dexa ordered she wishes to do it at [facility]

## 2021-08-27 NOTE — Assessment & Plan Note (Signed)
She continues to smoke daily but is no longer smoking in the house

## 2021-08-27 NOTE — Assessment & Plan Note (Signed)
hgba1c acceptable, minimize simple carbs. Increase exercise as tolerated. Continue current meds 

## 2021-08-27 NOTE — Progress Notes (Signed)
Subjective:   Cynthia Hubbard is a 70 y.o. female who presents for Medicare Annual (Subsequent) preventive examination.  Review of Systems     Cardiac Risk Factors include: advanced age (>43men, >56 women);dyslipidemia;hypertension     Objective:    Today's Vitals   08/27/21 1013  BP: 120/68  Resp: 16  Temp: 97.7 F (36.5 C)  SpO2: 99%  Weight: 136 lb (61.7 kg)  Height: 5\' 4"  (1.626 m)   Body mass index is 23.34 kg/m.  Advanced Directives 08/27/2021 06/09/2019 04/26/2018 03/10/2017 06/21/2015 06/22/2014 06/22/2014  Does Patient Have a Medical Advance Directive? Yes Yes No No No No No  Type of 08/22/2014 of [place];Living will Healthcare Power of [place];Living will - - - - -  Does patient want to make changes to medical advance directive? - No - Patient declined - - - - -  Copy of Healthcare Power of Attorney in Chart? Yes - validated most recent copy scanned in chart (See row information) No - copy requested - - - - -  Would patient like information on creating a medical advance directive? - - Yes (MAU/Ambulatory/Procedural Areas - Information given) No - Patient declined No - patient declined information Yes - Educational materials given -    Current Medications (verified) Outpatient Encounter Medications as of 08/27/2021  Medication Sig   Cholecalciferol (VITAMIN D3) 5000 UNITS CAPS Take 1 capsule by mouth daily.   FOLIC ACID PO Take by mouth.   lisinopril (ZESTRIL) 5 MG tablet TAKE 1 TABLET BY MOUTH TWICE A DAY   OVER THE COUNTER MEDICATION Mega red daily   Probiotic Product (PROBIOTIC DAILY PO) Take by mouth daily.   simvastatin (ZOCOR) 10 MG tablet TAKE 1 TABLET BY MOUTH EVERY DAY   [DISCONTINUED] Multiple Vitamins-Minerals (ZINC PO) Take by mouth.   No facility-administered encounter medications on file as of 08/27/2021.    Allergies (verified) Patient has no known allergies.   History: Past Medical History:  Diagnosis Date   Chicken pox 70 yrs  old   H/O measles    3 day measles    History of chicken pox    HTN (hypertension) 03/16/2014   Hyperlipidemia    weighed 30 pounds heavier- used to take crestor   Hypertension    was 30 pounds heavier   Measles 6 th grade   3 day measles   Osteopenia 09/14/2015   Other and unspecified hyperlipidemia 03/16/2014   Retinal hemorrhage of right eye 03/16/2014   Tobacco abuse disorder 03/16/2014   Past Surgical History:  Procedure Laterality Date   REFRACTIVE SURGERY Right    Family History  Adopted: Yes   Social History   Socioeconomic History   Marital status: Married    Spouse name: Not on file   Number of children: Not on file   Years of education: Not on file   Highest education level: Not on file  Occupational History   Not on file  Tobacco Use   Smoking status: Every Day    Packs/day: 0.75    Years: 40.00    Pack years: 30.00    Types: Cigarettes   Smokeless tobacco: Never   Tobacco comments:    pt declines cessation material  Substance and Sexual Activity   Alcohol use: Yes    Comment: very seldom   Drug use: No   Sexual activity: Yes    Comment: lives with husband, no dietary restrictions, works part time at 03/18/2014  Other Topics Concern  Not on file  Social History Narrative   Not on file   Social Determinants of Health   Financial Resource Strain: Low Risk    Difficulty of Paying Living Expenses: Not hard at all  Food Insecurity: No Food Insecurity   Worried About Programme researcher, broadcasting/film/video in the Last Year: Never true   Barista in the Last Year: Never true  Transportation Needs: No Transportation Needs   Lack of Transportation (Medical): No   Lack of Transportation (Non-Medical): No  Physical Activity: Inactive   Days of Exercise per Week: 0 days   Minutes of Exercise per Session: 0 min  Stress: No Stress Concern Present   Feeling of Stress : Not at all  Social Connections: Moderately Integrated   Frequency of Communication with  Friends and Family: More than three times a week   Frequency of Social Gatherings with Friends and Family: More than three times a week   Attends Religious Services: 1 to 4 times per year   Active Member of [employer] or Organizations: No   Attends Engineer, structural: Never   Marital Status: Married    Tobacco Counseling Ready to quit: Not Answered Counseling given: Not Answered Tobacco comments: pt declines cessation material   Clinical Intake:  Pre-visit preparation completed: Yes  Pain : No/denies pain     BMI - recorded: 23.48 Nutritional Status: BMI of 19-24  Normal Nutritional Risks: None Diabetes: No  How often do you need to have someone help you when you read instructions, pamphlets, or other written materials from your doctor or pharmacy?: 1 - Never  Diabetic?No  Interpreter Needed?: No  Information entered by :: Thomasenia Sales LPN   Activities of Daily Living In your present state of health, do you have any difficulty performing the following activities: 08/27/2021 08/27/2021  Hearing? N N  Vision? N N  Difficulty concentrating or making decisions? N N  Walking or climbing stairs? N N  Dressing or bathing? N N  Doing errands, shopping? N N  Preparing Food and eating ? N -  Using the Toilet? N -  In the past six months, have you accidently leaked urine? N -  Do you have problems with loss of bowel control? N -  Managing your Medications? N -  Managing your Finances? N -  Housekeeping or managing your Housekeeping? N -  Some recent data might be hidden    Patient Care Team: Bradd Canary, MD as PCP - General (Family Medicine) Mammography, [facility] (Diagnostic Radiology)  Indicate any recent Medical Services you may have received from other than [facility] providers in the past year (date may be approximate).     Assessment:   This is a routine wellness examination for [place].  Hearing/Vision screen Hearing Screening - Comments:: No issues Vision  Screening - Comments:: Last eye exam-11/2020-  Dietary issues and exercise activities discussed: Current Exercise Habits: The patient does not participate in regular exercise at present, Exercise limited by: None identified   Goals Addressed             This Visit's Progress    DIET - INCREASE WATER INTAKE   On track    Cut back on Pepsi and drink more water.     Quit Smoking   Not on track      Depression Screen PHQ 2/9 Scores 08/27/2021 07/17/2020 06/09/2019 04/26/2018 03/10/2017 09/09/2016 09/04/2015  PHQ - 2 Score 1 0 0 0 0 0 0  PHQ-  9 Score - 1 - - - - -    Fall Risk Fall Risk  08/27/2021 08/27/2021 01/17/2021 07/17/2020 06/09/2019  Falls in the past year? 0 0 0 - 0  Number falls in past yr: 0 0 0 0 -  Comment - - - - -  Injury with Fall? 0 0 0 1 -  Risk for fall due to : - No Fall Risks - - -  Follow up Falls prevention discussed - - - -    FALL RISK PREVENTION PERTAINING TO THE HOME:  Any stairs in or around the home? Yes  If so, are there any without handrails? No  Home free of loose throw rugs in walkways, pet beds, electrical cords, etc? Yes  Adequate lighting in your home to reduce risk of falls? Yes   ASSISTIVE DEVICES UTILIZED TO PREVENT FALLS:  Life alert? No  Use of a cane, walker or w/c? No  Grab bars in the bathroom? Yes  Shower chair or bench in shower? No  Elevated toilet seat or a handicapped toilet? No   TIMED UP AND GO:  Was the test performed? Yes .  Length of time to ambulate 10 feet: 10 sec.   Gait steady and fast without use of assistive device  Cognitive Function: MMSE - Mini Mental State Exam 04/26/2018 06/21/2015  Orientation to time 5 5  Orientation to Place 5 5  Registration 3 3  Attention/ Calculation 5 5  Recall 2 3  Language- name 2 objects 2 2  Language- repeat 1 1  Language- follow 3 step command 3 3  Language- read & follow direction 1 1  Write a sentence 1 1  Copy design 1 1  Total score 29 30         Immunizations Immunization History  Administered Date(s) Administered   Fluad Quad(high Dose 65+) 08/09/2019, 08/24/2020   PFIZER(Purple Top)SARS-COV-2 Vaccination 11/26/2019, 12/17/2019, 08/18/2020   Tdap 07/17/2020    TDAP status: Up to date  Flu Vaccine status: Completed at today's visit  Pneumococcal vaccine status: Declined,  Education has been provided regarding the importance of this vaccine but patient still declined. Advised may receive this vaccine at local pharmacy or Health Dept. Aware to provide a copy of the vaccination record if obtained from local pharmacy or Health Dept. Verbalized acceptance and understanding.   Covid-19 vaccine status: Information provided on how to obtain vaccines.   Qualifies for Shingles Vaccine? Yes   Zostavax completed No   Shingrix Completed?: No.    Education has been provided regarding the importance of this vaccine. Patient has been advised to call insurance company to determine out of pocket expense if they have not yet received this vaccine. Advised may also receive vaccine at local pharmacy or Health Dept. Verbalized acceptance and understanding.  Screening Tests Health Maintenance  Topic Date Due   COVID-19 Vaccine (4 - Booster for Pfizer series) 10/13/2020   INFLUENZA VACCINE  05/20/2021   Zoster Vaccines- Shingrix (1 of 2) 11/27/2021 (Originally 06/13/2001)   Pneumonia Vaccine 64+ Years old (1 - PCV) 08/27/2022 (Originally 06/13/1957)   MAMMOGRAM  08/20/2023   TETANUS/TDAP  07/17/2030   DEXA SCAN  Completed   HPV VACCINES  Aged Out   COLONOSCOPY (Pts 45-30yrs Insurance coverage will need to be confirmed)  Discontinued   Hepatitis C Screening  Discontinued    Health Maintenance  Health Maintenance Due  Topic Date Due   COVID-19 Vaccine (4 - Booster for Pfizer series) 10/13/2020  INFLUENZA VACCINE  05/20/2021    Colorectal cancer screening: Declined  Mammogram status: Completed bilateral 08/19/2021. Repeat every  year  Bone Density status: Ordered today by PCP. Pt provided with contact info and advised to call to schedule appt.  Lung Cancer Screening: (Low Dose CT Chest recommended if Age 16-80 years, 30 pack-year currently smoking OR have quit w/in 15years.) does qualify.   Lung Cancer Screening Referral: Declined  Additional Screening:  Hepatitis C Screening: does qualify; Declined  Vision Screening: Recommended annual ophthalmology exams for early detection of glaucoma and other disorders of the eye. Is the patient up to date with their annual eye exam?  Yes  Who is the provider or what is the name of the office in which the patient attends annual eye exams? Pt unsure of name   Dental Screening: Recommended annual dental exams for proper oral hygiene  Community Resource Referral / Chronic Care Management: CRR required this visit?  No   CCM required this visit?  No      Plan:     I have personally reviewed and noted the following in the patient's chart:   Medical and social history Use of alcohol, tobacco or illicit drugs  Current medications and supplements including opioid prescriptions.  Functional ability and status Nutritional status Physical activity Advanced directives List of other physicians Hospitalizations, surgeries, and ER visits in previous 12 months Vitals Screenings to include cognitive, depression, and falls Referrals and appointments  In addition, I have reviewed and discussed with patient certain preventive protocols, quality metrics, and best practice recommendations. A written personalized care plan for preventive services as well as general preventive health recommendations were provided to patient.   Patient to access avs on mychart.   Roanna Raider, LPN   [id number]  Nurse Health Advisor  Nurse Notes: None

## 2021-08-27 NOTE — Assessment & Plan Note (Signed)
Patient encouraged to maintain heart healthy diet, regular exercise, adequate sleep. Consider daily probiotics. Take medications as prescribed. Labs ordered and reviewed. Had MGM this week will request requests. Last Dexa scan 2019 repeat scan soon.

## 2021-08-27 NOTE — Assessment & Plan Note (Signed)
Asymptomatic, continue to monitor.

## 2022-01-16 ENCOUNTER — Other Ambulatory Visit: Payer: Self-pay | Admitting: Family Medicine

## 2022-02-20 ENCOUNTER — Other Ambulatory Visit: Payer: Self-pay | Admitting: Family Medicine

## 2022-02-27 ENCOUNTER — Ambulatory Visit: Payer: Medicare HMO | Admitting: Family Medicine

## 2022-06-06 DIAGNOSIS — H5203 Hypermetropia, bilateral: Secondary | ICD-10-CM | POA: Diagnosis not present

## 2022-06-17 DIAGNOSIS — Z0100 Encounter for examination of eyes and vision without abnormal findings: Secondary | ICD-10-CM | POA: Diagnosis not present

## 2022-07-17 ENCOUNTER — Other Ambulatory Visit: Payer: Self-pay | Admitting: Family Medicine

## 2022-08-22 ENCOUNTER — Other Ambulatory Visit: Payer: Self-pay | Admitting: Family Medicine

## 2022-08-25 DIAGNOSIS — M85851 Other specified disorders of bone density and structure, right thigh: Secondary | ICD-10-CM | POA: Diagnosis not present

## 2022-08-25 DIAGNOSIS — M810 Age-related osteoporosis without current pathological fracture: Secondary | ICD-10-CM | POA: Diagnosis not present

## 2022-08-25 DIAGNOSIS — Z1231 Encounter for screening mammogram for malignant neoplasm of breast: Secondary | ICD-10-CM | POA: Diagnosis not present

## 2022-08-25 LAB — HM DEXA SCAN

## 2022-08-25 LAB — HM MAMMOGRAPHY

## 2022-08-28 ENCOUNTER — Ambulatory Visit (INDEPENDENT_AMBULATORY_CARE_PROVIDER_SITE_OTHER): Payer: Medicare HMO | Admitting: Family Medicine

## 2022-08-28 VITALS — BP 128/70 | HR 65 | Temp 98.0°F | Resp 16 | Ht 63.0 in | Wt 138.2 lb

## 2022-08-28 DIAGNOSIS — F17200 Nicotine dependence, unspecified, uncomplicated: Secondary | ICD-10-CM

## 2022-08-28 DIAGNOSIS — I10 Essential (primary) hypertension: Secondary | ICD-10-CM

## 2022-08-28 DIAGNOSIS — M810 Age-related osteoporosis without current pathological fracture: Secondary | ICD-10-CM

## 2022-08-28 DIAGNOSIS — Z0000 Encounter for general adult medical examination without abnormal findings: Secondary | ICD-10-CM | POA: Diagnosis not present

## 2022-08-28 DIAGNOSIS — R69 Illness, unspecified: Secondary | ICD-10-CM | POA: Diagnosis not present

## 2022-08-28 DIAGNOSIS — E782 Mixed hyperlipidemia: Secondary | ICD-10-CM | POA: Diagnosis not present

## 2022-08-28 DIAGNOSIS — R739 Hyperglycemia, unspecified: Secondary | ICD-10-CM | POA: Diagnosis not present

## 2022-08-28 NOTE — Assessment & Plan Note (Signed)
Encouraged complete cessation. Discussed need to quit as relates to risk of numerous cancers, cardiac and pulmonary disease as well as neurologic complications. Counseled for greater than 3 minutes 

## 2022-08-28 NOTE — Assessment & Plan Note (Signed)
hgba1c acceptable, minimize simple carbs. Increase exercise as tolerated.  

## 2022-08-28 NOTE — Assessment & Plan Note (Addendum)
Encouraged to get adequate exercise, calcium and vitamin d intake patient declines meds at this time.

## 2022-08-28 NOTE — Assessment & Plan Note (Signed)
Well controlled, no changes to meds. Encouraged heart healthy diet such as the DASH diet and exercise as tolerated.  °

## 2022-08-28 NOTE — Patient Instructions (Addendum)
RSV (respiratory syncitial virus) vaccine at pharmacy, Arexvy Covid booster at pharmacy High dose flu shot done Shingrix is the new shingles shot, 2 shots over 2-6 months, confirm coverage with insurance and document, then can return here for shots with nurse appt or at pharmacy    Recommend calcium intake of 1200 to 1500 mg daily, divided into roughly 3 doses. Best source is the diet and a single dairy serving is about 500 mg, a supplement of calcium citrate once or twice daily to balance diet is fine if not getting enough in diet. Also need Vitamin D 2000 IU caps, 1 cap daily if not already taking vitamin D. Also recommend weight baring exercise on hips and upper body to keep bones strong   Preventive Care 65 Years and Older, Female Preventive care refers to lifestyle choices and visits with your health care provider that can promote health and wellness. Preventive care visits are also called wellness exams. What can I expect for my preventive care visit? Counseling Your health care provider may ask you questions about your: Medical history, including: Past medical problems. Family medical history. Pregnancy and menstrual history. History of falls. Current health, including: Memory and ability to understand (cognition). Emotional well-being. Home life and relationship well-being. Sexual activity and sexual health. Lifestyle, including: Alcohol, nicotine or tobacco, and drug use. Access to firearms. Diet, exercise, and sleep habits. Work and work Statistician. Sunscreen use. Safety issues such as seatbelt and bike helmet use. Physical exam Your health care provider will check your: Height and weight. These may be used to calculate your BMI (body mass index). BMI is a measurement that tells if you are at a healthy weight. Waist circumference. This measures the distance around your waistline. This measurement also tells if you are at a healthy weight and may help predict your risk of  certain diseases, such as type 2 diabetes and high blood pressure. Heart rate and blood pressure. Body temperature. Skin for abnormal spots. What immunizations do I need?  Vaccines are usually given at various ages, according to a schedule. Your health care provider will recommend vaccines for you based on your age, medical history, and lifestyle or other factors, such as travel or where you work. What tests do I need? Screening Your health care provider may recommend screening tests for certain conditions. This may include: Lipid and cholesterol levels. Hepatitis C test. Hepatitis B test. HIV (human immunodeficiency virus) test. STI (sexually transmitted infection) testing, if you are at risk. Lung cancer screening. Colorectal cancer screening. Diabetes screening. This is done by checking your blood sugar (glucose) after you have not eaten for a while (fasting). Mammogram. Talk with your health care provider about how often you should have regular mammograms. BRCA-related cancer screening. This may be done if you have a family history of breast, ovarian, tubal, or peritoneal cancers. Bone density scan. This is done to screen for osteoporosis. Talk with your health care provider about your test results, treatment options, and if necessary, the need for more tests. Follow these instructions at home: Eating and drinking  Eat a diet that includes fresh fruits and vegetables, whole grains, lean protein, and low-fat dairy products. Limit your intake of foods with high amounts of sugar, saturated fats, and salt. Take vitamin and mineral supplements as recommended by your health care provider. Do not drink alcohol if your health care provider tells you not to drink. If you drink alcohol: Limit how much you have to 0-1 drink a day. Know how  much alcohol is in your drink. In the [place] one drink equals one 12 oz bottle of beer (355 mL), one 5 oz glass of wine (148 mL), or one 1 oz glass of hard  liquor (44 mL). Lifestyle Brush your teeth every morning and night with fluoride toothpaste. Floss one time each day. Exercise for at least 30 minutes 5 or more days each week. Do not use any products that contain nicotine or tobacco. These products include cigarettes, chewing tobacco, and vaping devices, such as e-cigarettes. If you need help quitting, ask your health care provider. Do not use drugs. If you are sexually active, practice safe sex. Use a condom or other form of protection in order to prevent STIs. Take aspirin only as told by your health care provider. Make sure that you understand how much to take and what form to take. Work with your health care provider to find out whether it is safe and beneficial for you to take aspirin daily. Ask your health care provider if you need to take a cholesterol-lowering medicine (statin). Find healthy ways to manage stress, such as: Meditation, yoga, or listening to music. Journaling. Talking to a trusted person. Spending time with friends and family. Minimize exposure to UV radiation to reduce your risk of skin cancer. Safety Always wear your seat belt while driving or riding in a vehicle. Do not drive: If you have been drinking alcohol. Do not ride with someone who has been drinking. When you are tired or distracted. While texting. If you have been using any mind-altering substances or drugs. Wear a helmet and other protective equipment during sports activities. If you have firearms in your house, make sure you follow all gun safety procedures. What's next? Visit your health care provider once a year for an annual wellness visit. Ask your health care provider how often you should have your eyes and teeth checked. Stay up to date on all vaccines. This information is not intended to replace advice given to you by your health care provider. Make sure you discuss any questions you have with your health care provider. Document Revised:  04/03/2021 Document Reviewed: 04/03/2021 Elsevier Patient Education  [place].

## 2022-08-28 NOTE — Assessment & Plan Note (Addendum)
Patient encouraged to maintain heart healthy diet, regular exercise, adequate sleep. Consider daily probiotics. Take medications as prescribed. Labs ordered and reviewed  Colon cancer screening, patient declines colonoscoy, cologuard and ifob MGM 08/2022 normal Dexa 08/2022 osteoporosis RSV (respiratory syncitial virus) vaccine at pharmacy, Arexvy Covid booster at pharmacy High dose flu shot done Shingrix is the new shingles shot, 2 shots over 2-6 months, confirm coverage with insurance and document, then can return here for shots with nurse appt or at pharmacy  Shingrix is the new shingles shot, 2 shots over 2-6 months, confirm coverage with insurance and document, then can return here for shots with nurse appt or at pharmacy

## 2022-08-28 NOTE — Progress Notes (Signed)
Subjective:   By signing my name below, I, Kellie Simmering, attest that this documentation has been prepared under the direction and in the presence of Mosie Lukes, MD., 08/28/2022.   Patient ID: Cynthia Hubbard, female    DOB: February 13, 1951, 71 y.o.   MRN: [id number]  Chief Complaint  Patient presents with   Annual Exam    Annual exam   HPI Patient is in today for a comprehensive physical exam and follow up on chronic medical concerns.  Patient denies having fever, chills, ear pain, headaches, muscle pain, joint pain, new moles, rash, itching, congestion, sinus pain, sore throat, chest pain, palpitations, wheezing, nausea, vomitting, abdominal pain, diarrhea, constipation, blood in stool, dysuria, urgency, frequency and hematuria.  FHx: Patient is adopted.   Immunizations: She reports that the Influenza immunization caused pain in the left deltoid.  Immunization History  Administered Date(s) Administered   Fluad Quad(high Dose 65+) 08/09/2019, 08/24/2020, 08/27/2021, 08/13/2022   PFIZER(Purple Top)SARS-COV-2 Vaccination 11/26/2019, 12/17/2019, 08/18/2020   Tdap 07/17/2020   Lower Back Pain: Patient reports that she experiences intermittent lower back pain when standing.   Osteoporosis: Patient is interested in taking calcium supplements before taking medications to manage her osteoporosis.  Sadness: She is not depressed, but overwhelmed with recent events in her life.  Social Hx: She is still smoking cigarettes.   Supplements: She takes multivitamins daily.  Varicose Veins: Patient presents varicose veins on bilateral legs.   Past Medical History:  Diagnosis Date   Chicken pox 71 yrs old   H/O measles    3 day measles    History of chicken pox    HTN (hypertension) 03/16/2014   Hyperlipidemia    weighed 30 pounds heavier- used to take crestor   Hypertension    was 30 pounds heavier   Measles 6 th grade   3 day measles   Osteopenia 09/14/2015   Other and unspecified  hyperlipidemia 03/16/2014   Retinal hemorrhage of right eye 03/16/2014   Tobacco abuse disorder 03/16/2014   Past Surgical History:  Procedure Laterality Date   REFRACTIVE SURGERY Right    Family History  Adopted: Yes   Social History   Socioeconomic History   Marital status: Married    Spouse name: Not on file   Number of children: Not on file   Years of education: Not on file   Highest education level: Not on file  Occupational History   Not on file  Tobacco Use   Smoking status: Every Day    Packs/day: 0.75    Years: 40.00    Total pack years: 30.00    Types: Cigarettes   Smokeless tobacco: Never   Tobacco comments:    pt declines cessation material  Substance and Sexual Activity   Alcohol use: Yes    Comment: very seldom   Drug use: No   Sexual activity: Yes    Comment: lives with husband, no dietary restrictions, works part time at [employer]  Other Topics Concern   Not on file  Social History Narrative   Not on file   Social Determinants of Health   Financial Resource Strain: Low Risk  (08/27/2021)   Overall Financial Resource Strain (CARDIA)    Difficulty of Paying Living Expenses: Not hard at all  Food Insecurity: No Food Insecurity (08/27/2021)   Hunger Vital Sign    Worried About Running Out of Food in the Last Year: Never true    Ran Out of Food in the Last Year:  Never true  Transportation Needs: No Transportation Needs (08/27/2021)   PRAPARE - Administrator, Civil Service (Medical): No    Lack of Transportation (Non-Medical): No  Physical Activity: Inactive (08/27/2021)   Exercise Vital Sign    Days of Exercise per Week: 0 days    Minutes of Exercise per Session: 0 min  Stress: No Stress Concern Present (08/27/2021)   [employer] of Occupational Health - Occupational Stress Questionnaire    Feeling of Stress : Not at all  Social Connections: Moderately Integrated (08/27/2021)   Social Connection and Isolation Panel [NHANES]     Frequency of Communication with Friends and Family: More than three times a week    Frequency of Social Gatherings with Friends and Family: More than three times a week    Attends Religious Services: 1 to 4 times per year    Active Member of [employer] or Organizations: No    Attends Banker Meetings: Never    Marital Status: Married  Catering manager Violence: Not At Risk (08/27/2021)   Humiliation, Afraid, Rape, and Kick questionnaire    Fear of Current or Ex-Partner: No    Emotionally Abused: No    Physically Abused: No    Sexually Abused: No   Outpatient Medications Prior to Visit  Medication Sig Dispense Refill   Cholecalciferol (VITAMIN D3) 5000 UNITS CAPS Take 1 capsule by mouth daily.     FOLIC ACID PO Take by mouth.     lisinopril (ZESTRIL) 5 MG tablet Take 1 tablet (5 mg total) by mouth 2 (two) times daily. 180 tablet 1   OVER THE COUNTER MEDICATION Mega red daily     Probiotic Product (PROBIOTIC DAILY PO) Take by mouth daily.     simvastatin (ZOCOR) 10 MG tablet TAKE 1 TABLET BY MOUTH EVERY DAY 90 tablet 1   No facility-administered medications prior to visit.   No Known Allergies  Review of Systems  Constitutional:  Negative for chills and fever.  HENT:  Negative for congestion, ear pain, sinus pain and sore throat.   Respiratory:  Negative for cough, shortness of breath and wheezing.   Cardiovascular:  Negative for chest pain and palpitations.  Gastrointestinal:  Negative for abdominal pain, blood in stool, constipation, diarrhea, nausea and vomiting.  Genitourinary:  Negative for dysuria, frequency, hematuria and urgency.  Musculoskeletal:  Negative for joint pain and myalgias.  Skin:  Negative for itching and rash.       (-) New moles.  Neurological:  Negative for headaches.      Objective:    Physical Exam Constitutional:      General: She is not in acute distress.    Appearance: Normal appearance. She is not ill-appearing.  HENT:     Head:  Normocephalic and atraumatic.     Right Ear: Tympanic membrane, ear canal and external ear normal.     Left Ear: Tympanic membrane, ear canal and external ear normal.     Mouth/Throat:     Mouth: Mucous membranes are moist.     Pharynx: Oropharynx is clear.  Eyes:     Extraocular Movements: Extraocular movements intact.     Right eye: No nystagmus.     Left eye: No nystagmus.     Pupils: Pupils are equal, round, and reactive to light.  Neck:     Vascular: No carotid bruit.  Cardiovascular:     Rate and Rhythm: Normal rate and regular rhythm.     Pulses: Normal pulses.  Heart sounds: Normal heart sounds. No murmur heard.    No gallop.  Pulmonary:     Effort: Pulmonary effort is normal. No respiratory distress.     Breath sounds: Normal breath sounds. No wheezing or rales.  Abdominal:     General: Bowel sounds are normal.     Tenderness: There is no abdominal tenderness.  Musculoskeletal:     Comments: Muscle strength 5/5 on upper and lower extremities.   Lymphadenopathy:     Cervical: No cervical adenopathy.  Skin:    General: Skin is warm and dry.  Neurological:     Mental Status: She is alert and oriented to person, place, and time.     Sensory: Sensation is intact.     Motor: Motor function is intact.     Coordination: Coordination is intact.     Deep Tendon Reflexes:     Reflex Scores:      Patellar reflexes are 2+ on the right side and 2+ on the left side. Psychiatric:        Mood and Affect: Mood normal.        Behavior: Behavior normal.        Judgment: Judgment normal.    BP 128/70 (BP Location: Right Arm, Patient Position: Sitting, Cuff Size: Normal)   Pulse 65   Temp 98 F (36.7 C) (Oral)   Resp 16   Ht 5\' 3"  (1.6 m)   Wt 138 lb 3.2 oz (62.7 kg)   SpO2 95%   BMI 24.48 kg/m  Wt Readings from Last 3 Encounters:  08/28/22 138 lb 3.2 oz (62.7 kg)  08/27/21 136 lb (61.7 kg)  08/27/21 136 lb 12.8 oz (62.1 kg)   Diabetic Foot Exam - Simple   No data  filed    Lab Results  Component Value Date   WBC 5.1 08/28/2022   HGB 13.1 08/28/2022   HCT 39.4 08/28/2022   PLT 195.0 08/28/2022   GLUCOSE 88 08/28/2022   CHOL 155 08/28/2022   TRIG 134.0 08/28/2022   HDL 40.20 08/28/2022   LDLCALC 88 08/28/2022   ALT 14 08/28/2022   AST 22 08/28/2022   NA 140 08/28/2022   K 4.7 08/28/2022   CL 103 08/28/2022   CREATININE 0.88 08/28/2022   BUN 20 08/28/2022   CO2 31 08/28/2022   TSH 0.70 08/28/2022   HGBA1C 5.8 08/28/2022   Lab Results  Component Value Date   TSH 0.70 08/28/2022   Lab Results  Component Value Date   WBC 5.1 08/28/2022   HGB 13.1 08/28/2022   HCT 39.4 08/28/2022   MCV 83.8 08/28/2022   PLT 195.0 08/28/2022   Lab Results  Component Value Date   NA 140 08/28/2022   K 4.7 08/28/2022   CO2 31 08/28/2022   GLUCOSE 88 08/28/2022   BUN 20 08/28/2022   CREATININE 0.88 08/28/2022   BILITOT 0.4 08/28/2022   ALKPHOS 76 08/28/2022   AST 22 08/28/2022   ALT 14 08/28/2022   PROT 7.2 08/28/2022   ALBUMIN 4.3 08/28/2022   CALCIUM 9.7 08/28/2022   GFR 65.29 08/28/2022   Lab Results  Component Value Date   CHOL 155 08/28/2022   Lab Results  Component Value Date   HDL 40.20 08/28/2022   Lab Results  Component Value Date   LDLCALC 88 08/28/2022   Lab Results  Component Value Date   TRIG 134.0 08/28/2022   Lab Results  Component Value Date   CHOLHDL 4 08/28/2022   Lab  Results  Component Value Date   HGBA1C 5.8 08/28/2022   Colonoscopy: Patient is not interested in receiving a colonoscopy.  DEXA: Last completed on 08/25/2022. Patient has OSTEOPOROSIS. Repeat in 5 years.  Mammogram: Last completed on 08/25/2022. No mammographic evidence of malignancy. Repeat in 1 year.  Pap Smear: Last completed on 10/29/2018. Normal results. Repeat in 3-5 years.    Assessment & Plan:   Problem List Items Addressed This Visit     HTN (hypertension) - Primary (Chronic)    Well controlled, no changes to meds.  Encouraged heart healthy diet such as the DASH diet and exercise as tolerated.        Relevant Orders   CBC (Completed)   Comprehensive metabolic panel (Completed)   TSH (Completed)   Hyperlipidemia, mixed    Encourage heart healthy diet such as MIND or DASH diet, increase exercise, avoid trans fats, simple carbohydrates and processed foods, consider a krill or fish or flaxseed oil cap daily.        Relevant Orders   Lipid panel (Completed)   Tobacco use disorder    Encouraged complete cessation. Discussed need to quit as relates to risk of numerous cancers, cardiac and pulmonary disease as well as neurologic complications. Counseled for greater than 3 minutes       Preventative health care    Patient encouraged to maintain heart healthy diet, regular exercise, adequate sleep. Consider daily probiotics. Take medications as prescribed. Labs ordered and reviewed  Colon cancer screening, patient declines colonoscoy, cologuard and ifob MGM 08/2022 normal Dexa 08/2022 osteoporosis RSV (respiratory syncitial virus) vaccine at pharmacy, Arexvy Covid booster at pharmacy High dose flu shot done Shingrix is the new shingles shot, 2 shots over 2-6 months, confirm coverage with insurance and document, then can return here for shots with nurse appt or at pharmacy  Shingrix is the new shingles shot, 2 shots over 2-6 months, confirm coverage with insurance and document, then can return here for shots with nurse appt or at pharmacy       Osteoporosis    Encouraged to get adequate exercise, calcium and vitamin d intake patient declines meds at this time.       Relevant Orders   VITAMIN D 25 Hydroxy (Vit-D Deficiency, Fractures) (Completed)   Hyperglycemia    hgba1c acceptable, minimize simple carbs. Increase exercise as tolerated.        Relevant Orders   Hemoglobin A1c (Completed)   No orders of the defined types were placed in this encounter.  I, Penni Homans, MD, personally preformed  the services described in this documentation.  All medical record entries made by the scribe were at my direction and in my presence. I have reviewed the chart and discharge instructions (if applicable) and agree that the record reflects my personal performance and is accurate and complete. 08/28/2022  I,Mohammed Iqbal,acting as a scribe for Penni Homans, MD.,have documented all relevant documentation on the behalf of Penni Homans, MD,as directed by  Penni Homans, MD while in the presence of Penni Homans, MD.  Penni Homans, MD

## 2022-08-28 NOTE — Assessment & Plan Note (Signed)
Encourage heart healthy diet such as MIND or DASH diet, increase exercise, avoid trans fats, simple carbohydrates and processed foods, consider a krill or fish or flaxseed oil cap daily.  °

## 2022-08-29 LAB — COMPREHENSIVE METABOLIC PANEL
ALT: 14 U/L (ref 0–35)
AST: 22 U/L (ref 0–37)
Albumin: 4.3 g/dL (ref 3.5–5.2)
Alkaline Phosphatase: 76 U/L (ref 39–117)
BUN: 20 mg/dL (ref 6–23)
CO2: 31 mEq/L (ref 19–32)
Calcium: 9.7 mg/dL (ref 8.4–10.5)
Chloride: 103 mEq/L (ref 96–112)
Creatinine, Ser: 0.88 mg/dL (ref 0.40–1.20)
GFR: 65.29 mL/min (ref >60.00)
Glucose, Bld: 88 mg/dL (ref 70–99)
Potassium: 4.7 mEq/L (ref 3.5–5.1)
Sodium: 140 mEq/L (ref 135–145)
Total Bilirubin: 0.4 mg/dL (ref 0.2–1.2)
Total Protein: 7.2 g/dL (ref 6.0–8.3)

## 2022-08-29 LAB — LIPID PANEL
Cholesterol: 155 mg/dL (ref 0–200)
HDL: 40.2 mg/dL (ref >39.00)
LDL Cholesterol: 88 mg/dL (ref 0–99)
NonHDL: 115.06
Total CHOL/HDL Ratio: 4
Triglycerides: 134 mg/dL (ref 0.0–149.0)
VLDL: 26.8 mg/dL (ref 0.0–40.0)

## 2022-08-29 LAB — TSH: TSH: 0.7 u[IU]/mL (ref 0.35–5.50)

## 2022-08-29 LAB — CBC
HCT: 39.4 % (ref 36.0–46.0)
Hemoglobin: 13.1 g/dL (ref 12.0–15.0)
MCHC: 33.2 g/dL (ref 30.0–36.0)
MCV: 83.8 fl (ref 78.0–100.0)
Platelets: 195 10*3/uL (ref 150.0–400.0)
RBC: 4.7 Mil/uL (ref 3.87–5.11)
RDW: 13.4 % (ref 11.5–15.5)
WBC: 5.1 10*3/uL (ref 4.0–10.5)

## 2022-08-29 LAB — HEMOGLOBIN A1C: Hgb A1c MFr Bld: 5.8 % (ref 4.6–6.5)

## 2022-08-29 LAB — VITAMIN D 25 HYDROXY (VIT D DEFICIENCY, FRACTURES): VITD: 62.24 ng/mL (ref 30.00–100.00)

## 2022-09-02 ENCOUNTER — Telehealth: Payer: Self-pay | Admitting: *Deleted

## 2022-09-02 ENCOUNTER — Telehealth: Payer: Self-pay

## 2022-09-02 NOTE — Telephone Encounter (Signed)
Caller Name Cynthia Hubbard Caller Phone Number [phone] Patient Name Cynthia Hubbard Patient DOB December 29, 1950 Call Type Message Only Information Provided Reason for Call Request for General Office Information Initial Comment The caller states she had a messages yesterday from the office regarding changing her appt. The caller states she is calling to confirm her virtual visit. Additional Comment Office hours provided. Disp. Time Disposition Final User 09/02/2022 7:33:30 AM General Information Provided Yes Andrey Campanile, [employer]

## 2022-09-02 NOTE — Telephone Encounter (Signed)
Called pt appt changed for 09/02/22 2/20

## 2022-09-02 NOTE — Telephone Encounter (Signed)
Called pt was advised and  Has been added on to AWV 3

## 2022-09-02 NOTE — Telephone Encounter (Signed)
Called pt and sent mychart message  Letting pt know we had to cancel/ change appt  To call our office.

## 2022-09-02 NOTE — Telephone Encounter (Signed)
Opened in error

## 2022-09-03 ENCOUNTER — Ambulatory Visit (INDEPENDENT_AMBULATORY_CARE_PROVIDER_SITE_OTHER): Payer: Medicare HMO | Admitting: *Deleted

## 2022-09-03 DIAGNOSIS — Z0000 Encounter for general adult medical examination without abnormal findings: Secondary | ICD-10-CM | POA: Diagnosis not present

## 2022-09-03 NOTE — Progress Notes (Signed)
Subjective:   Cynthia Hubbard is a 71 y.o. female who presents for Medicare Annual (Subsequent) preventive examination.  I connected with  [name] on 09/03/22 by a audio enabled telemedicine application and verified that I am speaking with the correct person using two identifiers.  Patient Location: Home  Provider Location: Office/Clinic  I discussed the limitations of evaluation and management by telemedicine. The patient expressed understanding and agreed to proceed.   Review of Systems    Defer to PCP Cardiac Risk Factors include: advanced age (>68men, >20 women);dyslipidemia;hypertension     Objective:    There were no vitals filed for this visit. There is no height or weight on file to calculate BMI.     09/03/2022    2:32 PM 08/27/2021   10:03 AM 06/09/2019    9:59 AM 04/26/2018    1:19 PM 03/10/2017    8:39 AM 06/21/2015    9:58 AM 06/22/2014    2:57 PM  Advanced Directives  Does Patient Have a Medical Advance Directive? Yes Yes Yes No No No No  Type of Estate agent of [place];Living will Healthcare Power of [place];Living will Healthcare Power of [place];Living will      Does patient want to make changes to medical advance directive? No - Patient declined  No - Patient declined      Copy of Healthcare Power of Attorney in Chart? Yes - validated most recent copy scanned in chart (See row information) Yes - validated most recent copy scanned in chart (See row information) No - copy requested      Would patient like information on creating a medical advance directive?    Yes (MAU/Ambulatory/Procedural Areas - Information given) No - Patient declined No - patient declined information Yes - Educational materials given    Current Medications (verified) Outpatient Encounter Medications as of 09/03/2022  Medication Sig   Cholecalciferol (VITAMIN D3) 5000 UNITS CAPS Take 1 capsule by mouth daily.   FOLIC ACID PO Take by mouth.   lisinopril (ZESTRIL) 5  MG tablet Take 1 tablet (5 mg total) by mouth 2 (two) times daily.   OVER THE COUNTER MEDICATION Mega red daily   Probiotic Product (PROBIOTIC DAILY PO) Take by mouth daily.   simvastatin (ZOCOR) 10 MG tablet TAKE 1 TABLET BY MOUTH EVERY DAY   No facility-administered encounter medications on file as of 09/03/2022.    Allergies (verified) Patient has no known allergies.   History: Past Medical History:  Diagnosis Date   Chicken pox 71 yrs old   H/O measles    3 day measles    History of chicken pox    HTN (hypertension) 03/16/2014   Hyperlipidemia    weighed 30 pounds heavier- used to take crestor   Hypertension    was 30 pounds heavier   Measles 6 th grade   3 day measles   Osteopenia 09/14/2015   Other and unspecified hyperlipidemia 03/16/2014   Retinal hemorrhage of right eye 03/16/2014   Tobacco abuse disorder 03/16/2014   Past Surgical History:  Procedure Laterality Date   REFRACTIVE SURGERY Right    Family History  Adopted: Yes   Social History   Socioeconomic History   Marital status: Married    Spouse name: Not on file   Number of children: Not on file   Years of education: Not on file   Highest education level: Not on file  Occupational History   Not on file  Tobacco Use   Smoking status:  Every Day    Packs/day: 0.75    Years: 40.00    Total pack years: 30.00    Types: Cigarettes   Smokeless tobacco: Never   Tobacco comments:    pt declines cessation material  Substance and Sexual Activity   Alcohol use: Yes    Comment: very seldom   Drug use: No   Sexual activity: Yes    Comment: lives with husband, no dietary restrictions, works part time at [employer]  Other Topics Concern   Not on file  Social History Narrative   Not on file   Social Determinants of Health   Financial Resource Strain: Low Risk  (08/27/2021)   Overall Financial Resource Strain (CARDIA)    Difficulty of Paying Living Expenses: Not hard at all  Food Insecurity: No  Food Insecurity (09/03/2022)   Hunger Vital Sign    Worried About Running Out of Food in the Last Year: Never true    Ran Out of Food in the Last Year: Never true  Transportation Needs: No Transportation Needs (09/03/2022)   PRAPARE - Administrator, Civil ServiceTransportation    Lack of Transportation (Medical): No    Lack of Transportation (Non-Medical): No  Physical Activity: Inactive (08/27/2021)   Exercise Vital Sign    Days of Exercise per Week: 0 days    Minutes of Exercise per Session: 0 min  Stress: No Stress Concern Present (08/27/2021)   [employer] - Occupational Stress Questionnaire    Feeling of Stress : Not at all  Social Connections: Moderately Integrated (08/27/2021)   Social Connection and Isolation Panel [NHANES]    Frequency of Communication with Friends and Family: More than three times a week    Frequency of Social Gatherings with Friends and Family: More than three times a week    Attends Religious Services: 1 to 4 times per year    Active Member of [employer]ubs or Organizations: No    Attends Engineer, structuralClub or Organization Meetings: Never    Marital Status: Married    Tobacco Counseling Ready to quit: Not Answered Counseling given: Not Answered Tobacco comments: pt declines cessation material   Clinical Intake:  Pre-visit preparation completed: Yes  Pain : No/denies pain  Diabetes: No  How often do you need to have someone help you when you read instructions, pamphlets, or other written materials from your doctor or pharmacy?: 1 - Never   Activities of Daily Living    09/03/2022    2:37 PM  In your present state of health, do you have any difficulty performing the following activities:  Hearing? 0  Vision? 0  Difficulty concentrating or making decisions? 0  Walking or climbing stairs? 0  Dressing or bathing? 0  Doing errands, shopping? 0  Preparing Food and eating ? N  Using the Toilet? N  In the past six months, have you accidently leaked urine? Y   Comment some slight stress incontinence  Do you have problems with loss of bowel control? N  Managing your Medications? N  Managing your Finances? N  Housekeeping or managing your Housekeeping? N    Patient Care Team: Bradd CanaryBlyth, Stacey A, MD as PCP - General (Family Medicine) Mammography, [facility] (Diagnostic Radiology)  Indicate any recent Medical Services you may have received from other than [facility] providers in the past year (date may be approximate).     Assessment:   This is a routine wellness examination for [place].  Hearing/Vision screen No results found.  Dietary issues and exercise activities  discussed: Current Exercise Habits: The patient does not participate in regular exercise at present, Exercise limited by: None identified   Goals Addressed   None    Depression Screen    09/03/2022    2:35 PM 08/28/2022    1:17 PM 08/27/2021   12:01 PM 08/27/2021   10:06 AM 07/17/2020    9:54 AM 06/09/2019    9:59 AM 04/26/2018    1:19 PM  PHQ 2/9 Scores  PHQ - 2 Score 0 0 0 1 0 0 0  PHQ- 9 Score  0 1  1      Fall Risk    09/03/2022    2:32 PM 08/28/2022    1:17 PM 08/27/2021   10:04 AM 08/27/2021    8:53 AM 01/17/2021    9:44 AM  Fall Risk   Falls in the past year? 0 0 0 0 0  Number falls in past yr: 0 0 0 0 0  Injury with Fall? 0 0 0 0 0  Risk for fall due to : No Fall Risks   No Fall Risks   Follow up Falls evaluation completed Falls evaluation completed Falls prevention discussed      FALL RISK PREVENTION PERTAINING TO THE HOME:  Any stairs in or around the home? Yes  If so, are there any without handrails? No  Home free of loose throw rugs in walkways, pet beds, electrical cords, etc? Yes  Adequate lighting in your home to reduce risk of falls? Yes   ASSISTIVE DEVICES UTILIZED TO PREVENT FALLS:  Life alert? No  Use of a cane, walker or w/c? No  Grab bars in the bathroom? Yes  Shower chair or bench in shower? No  Elevated toilet seat or a handicapped toilet? No    TIMED UP AND GO:  Was the test performed?  No, audio visit .    Cognitive Function:    04/26/2018    1:20 PM 06/21/2015   10:27 AM  MMSE - Mini Mental State Exam  Orientation to time 5 5  Orientation to Place 5 5  Registration 3 3  Attention/ Calculation 5 5  Recall 2 3  Language- name 2 objects 2 2  Language- repeat 1 1  Language- follow 3 step command 3 3  Language- read & follow direction 1 1  Write a sentence 1 1  Copy design 1 1  Total score 29 30        09/03/2022    2:48 PM  6CIT Screen  What Year? 0 points  What month? 0 points  What time? 0 points  Count back from 20 0 points  Months in reverse 0 points  Repeat phrase 0 points  Total Score 0 points    Immunizations Immunization History  Administered Date(s) Administered   Fluad Quad(high Dose 65+) 08/09/2019, 08/24/2020, 08/27/2021, 08/13/2022   PFIZER(Purple Top)SARS-COV-2 Vaccination 11/26/2019, 12/17/2019, 08/18/2020   Tdap 07/17/2020    TDAP status: Up to date  Flu Vaccine status: Up to date  Pneumococcal vaccine status: Due, Education has been provided regarding the importance of this vaccine. Advised may receive this vaccine at local pharmacy or Health Dept. Aware to provide a copy of the vaccination record if obtained from local pharmacy or Health Dept. Verbalized acceptance and understanding.  Covid-19 vaccine status: Information provided on how to obtain vaccines.   Qualifies for Shingles Vaccine? Yes   Zostavax completed No   Shingrix Completed?: No.    Education has been provided regarding the  importance of this vaccine. Patient has been advised to call insurance company to determine out of pocket expense if they have not yet received this vaccine. Advised may also receive vaccine at local pharmacy or Health Dept. Verbalized acceptance and understanding.  Screening Tests Health Maintenance  Topic Date Due   Pneumonia Vaccine 61+ Years old (1 - PCV) Never done   Lung Cancer Screening   Never done   Zoster Vaccines- Shingrix (1 of 2) Never done   Medicare Annual Wellness (AWV)  08/27/2022   COVID-19 Vaccine (4 - Pfizer series) 10/17/2022 (Originally 10/13/2020)   MAMMOGRAM  08/25/2024   TETANUS/TDAP  07/17/2030   INFLUENZA VACCINE  Completed   DEXA SCAN  Completed   HPV VACCINES  Aged Out   COLONOSCOPY (Pts 45-49yrs Insurance coverage will need to be confirmed)  Discontinued   Hepatitis C Screening  Discontinued    Health Maintenance  Health Maintenance Due  Topic Date Due   Pneumonia Vaccine 30+ Years old (1 - PCV) Never done   Lung Cancer Screening  Never done   Zoster Vaccines- Shingrix (1 of 2) Never done   Medicare Annual Wellness (AWV)  08/27/2022    Colorectal cancer screening: No longer required. Per pt request.  Mammogram status: Completed 08/25/22. Repeat every year  Bone Density status: Completed 08/25/22. Results reflect: Bone density results: OSTEOPOROSIS. Repeat every 2 years.  Lung Cancer Screening: (Low Dose CT Chest recommended if Age 57-80 years, 30 pack-year currently smoking OR have quit w/in 15years.) does qualify.   Lung Cancer Screening Referral: Pt declined  Additional Screening:  Hepatitis C Screening: does qualify  Vision Screening: Recommended annual ophthalmology exams for early detection of glaucoma and other disorders of the eye. Is the patient up to date with their annual eye exam?  Yes  Who is the provider or what is the name of the office in which the patient attends annual eye exams? Dr.  Norva Pavlov If pt is not established with a provider, would they like to be referred to a provider to establish care? No .   Dental Screening: Recommended annual dental exams for proper oral hygiene  Community Resource Referral / Chronic Care Management: CRR required this visit?  No   CCM required this visit?  No      Plan:     I have personally reviewed and noted the following in the patient's chart:   Medical and social  history Use of alcohol, tobacco or illicit drugs  Current medications and supplements including opioid prescriptions. Patient is not currently taking opioid prescriptions. Functional ability and status Nutritional status Physical activity Advanced directives List of other physicians Hospitalizations, surgeries, and ER visits in previous 12 months Vitals Screenings to include cognitive, depression, and falls Referrals and appointments  In addition, I have reviewed and discussed with patient certain preventive protocols, quality metrics, and best practice recommendations. A written personalized care plan for preventive services as well as general preventive health recommendations were provided to patient.   Due to this being a telephonic visit, the after visit summary with patients personalized plan was offered to patient via mail or my-chart.  Patient would like to access on my-chart.  Donne Anon, [place]   09/03/2022   Nurse Notes: None

## 2022-09-03 NOTE — Patient Instructions (Signed)
[name] , Thank you for taking time to come for your Medicare Wellness Visit. I appreciate your ongoing commitment to your health goals. Please review the following plan we discussed and let me know if I can assist you in the future.   These are the goals we discussed:  Goals      DIET - INCREASE WATER INTAKE     Cut back on Pepsi and drink more water.     Increase physical activity     Yoga and walking in park.       Quit Smoking     Weight (lb) < 130 lb (59 kg)        This is a list of the screening recommended for you and due dates:  Health Maintenance  Topic Date Due   Pneumonia Vaccine (1 - PCV) Never done   Screening for Lung Cancer  Never done   Zoster (Shingles) Vaccine (1 of 2) Never done   COVID-19 Vaccine (4 - Pfizer series) 10/17/2022*   Medicare Annual Wellness Visit  09/04/2023   Mammogram  08/25/2024   Tetanus Vaccine  07/17/2030   Flu Shot  Completed   DEXA scan (bone density measurement)  Completed   HPV Vaccine  Aged Out   Colon Cancer Screening  Discontinued   Hepatitis C Screening: USPSTF Recommendation to screen - Ages 18-79 yo.  Discontinued  *Topic was postponed. The date shown is not the original due date.     Next appointment: Follow up in one year for your annual wellness visit.   Preventive Care 41 Years and Older, Female Preventive care refers to lifestyle choices and visits with your health care provider that can promote health and wellness. What does preventive care include? A yearly physical exam. This is also called an annual well check. Dental exams once or twice a year. Routine eye exams. Ask your health care provider how often you should have your eyes checked. Personal lifestyle choices, including: Daily care of your teeth and gums. Regular physical activity. Eating a healthy diet. Avoiding tobacco and drug use. Limiting alcohol use. Practicing safe sex. Taking low-dose aspirin every day. Taking vitamin and mineral  supplements as recommended by your health care provider. What happens during an annual well check? The services and screenings done by your health care provider during your annual well check will depend on your age, overall health, lifestyle risk factors, and family history of disease. Counseling  Your health care provider may ask you questions about your: Alcohol use. Tobacco use. Drug use. Emotional well-being. Home and relationship well-being. Sexual activity. Eating habits. History of falls. Memory and ability to understand (cognition). Work and work Astronomer. Reproductive health. Screening  You may have the following tests or measurements: Height, weight, and BMI. Blood pressure. Lipid and cholesterol levels. These may be checked every 5 years, or more frequently if you are over 71 years old. Skin check. Lung cancer screening. You may have this screening every year starting at age 77 if you have a 30-pack-year history of smoking and currently smoke or have quit within the past 15 years. Fecal occult blood test (FOBT) of the stool. You may have this test every year starting at age 64. Flexible sigmoidoscopy or colonoscopy. You may have a sigmoidoscopy every 5 years or a colonoscopy every 10 years starting at age 52. Hepatitis C blood test. Hepatitis B blood test. Sexually transmitted disease (STD) testing. Diabetes screening. This is done by checking your blood sugar (glucose) after you  have not eaten for a while (fasting). You may have this done every 1-3 years. Bone density scan. This is done to screen for osteoporosis. You may have this done starting at age 76. Mammogram. This may be done every 1-2 years. Talk to your health care provider about how often you should have regular mammograms. Talk with your health care provider about your test results, treatment options, and if necessary, the need for more tests. Vaccines  Your health care provider may recommend certain  vaccines, such as: Influenza vaccine. This is recommended every year. Tetanus, diphtheria, and acellular pertussis (Tdap, Td) vaccine. You may need a Td booster every 10 years. Zoster vaccine. You may need this after age 68. Pneumococcal 13-valent conjugate (PCV13) vaccine. One dose is recommended after age 68. Pneumococcal polysaccharide (PPSV23) vaccine. One dose is recommended after age 51. Talk to your health care provider about which screenings and vaccines you need and how often you need them. This information is not intended to replace advice given to you by your health care provider. Make sure you discuss any questions you have with your health care provider. Document Released: 11/02/2015 Document Revised: 06/25/2016 Document Reviewed: 08/07/2015 Elsevier Interactive Patient Education  2017 [employer].  Fall Prevention in the Home Falls can cause injuries. They can happen to people of all ages. There are many things you can do to make your home safe and to help prevent falls. What can I do on the outside of my home? Regularly fix the edges of walkways and driveways and fix any cracks. Remove anything that might make you trip as you walk through a door, such as a raised step or threshold. Trim any bushes or trees on the path to your home. Use bright outdoor lighting. Clear any walking paths of anything that might make someone trip, such as rocks or tools. Regularly check to see if handrails are loose or broken. Make sure that both sides of any steps have handrails. Any raised decks and porches should have guardrails on the edges. Have any leaves, snow, or ice cleared regularly. Use sand or salt on walking paths during winter. Clean up any spills in your garage right away. This includes oil or grease spills. What can I do in the bathroom? Use night lights. Install grab bars by the toilet and in the tub and shower. Do not use towel bars as grab bars. Use non-skid mats or decals in  the tub or shower. If you need to sit down in the shower, use a plastic, non-slip stool. Keep the floor dry. Clean up any water that spills on the floor as soon as it happens. Remove soap buildup in the tub or shower regularly. Attach bath mats securely with double-sided non-slip rug tape. Do not have throw rugs and other things on the floor that can make you trip. What can I do in the bedroom? Use night lights. Make sure that you have a light by your bed that is easy to reach. Do not use any sheets or blankets that are too big for your bed. They should not hang down onto the floor. Have a firm chair that has side arms. You can use this for support while you get dressed. Do not have throw rugs and other things on the floor that can make you trip. What can I do in the kitchen? Clean up any spills right away. Avoid walking on wet floors. Keep items that you use a lot in easy-to-reach places. If you need  to reach something above you, use a strong step stool that has a grab bar. Keep electrical cords out of the way. Do not use floor polish or wax that makes floors slippery. If you must use wax, use non-skid floor wax. Do not have throw rugs and other things on the floor that can make you trip. What can I do with my stairs? Do not leave any items on the stairs. Make sure that there are handrails on both sides of the stairs and use them. Fix handrails that are broken or loose. Make sure that handrails are as long as the stairways. Check any carpeting to make sure that it is firmly attached to the stairs. Fix any carpet that is loose or worn. Avoid having throw rugs at the top or bottom of the stairs. If you do have throw rugs, attach them to the floor with carpet tape. Make sure that you have a light switch at the top of the stairs and the bottom of the stairs. If you do not have them, ask someone to add them for you. What else can I do to help prevent falls? Wear shoes that: Do not have high  heels. Have rubber bottoms. Are comfortable and fit you well. Are closed at the toe. Do not wear sandals. If you use a stepladder: Make sure that it is fully opened. Do not climb a closed stepladder. Make sure that both sides of the stepladder are locked into place. Ask someone to hold it for you, if possible. Clearly mark and make sure that you can see: Any grab bars or handrails. First and last steps. Where the edge of each step is. Use tools that help you move around (mobility aids) if they are needed. These include: Canes. Walkers. Scooters. Crutches. Turn on the lights when you go into a dark area. Replace any light bulbs as soon as they burn out. Set up your furniture so you have a clear path. Avoid moving your furniture around. If any of your floors are uneven, fix them. If there are any pets around you, be aware of where they are. Review your medicines with your doctor. Some medicines can make you feel dizzy. This can increase your chance of falling. Ask your doctor what other things that you can do to help prevent falls. This information is not intended to replace advice given to you by your health care provider. Make sure you discuss any questions you have with your health care provider. Document Released: 08/02/2009 Document Revised: 03/13/2016 Document Reviewed: 11/10/2014 Elsevier Interactive Patient Education  2017 [employer].

## 2023-02-06 ENCOUNTER — Other Ambulatory Visit: Payer: Self-pay | Admitting: Family Medicine

## 2023-02-20 ENCOUNTER — Other Ambulatory Visit: Payer: Self-pay | Admitting: Family Medicine

## 2023-04-30 ENCOUNTER — Telehealth: Payer: Self-pay | Admitting: Family Medicine

## 2023-04-30 NOTE — Telephone Encounter (Signed)
Pt called requesting lab orders be put in for a UTI check as she is pretty sure she has one. Pt stated she had taken phenazopyridine hydrochloride to help.

## 2023-04-30 NOTE — Telephone Encounter (Signed)
Called pt unable to lvm, pt haven't been seen a in year and will need appt.  Sent pt mychart message to call to make appt to be seen.

## 2023-05-01 DIAGNOSIS — R350 Frequency of micturition: Secondary | ICD-10-CM | POA: Diagnosis not present

## 2023-05-01 DIAGNOSIS — R300 Dysuria: Secondary | ICD-10-CM | POA: Diagnosis not present

## 2023-05-01 DIAGNOSIS — N390 Urinary tract infection, site not specified: Secondary | ICD-10-CM | POA: Diagnosis not present

## 2023-08-07 ENCOUNTER — Other Ambulatory Visit: Payer: Self-pay | Admitting: Family Medicine

## 2023-08-24 ENCOUNTER — Telehealth: Payer: Self-pay | Admitting: Family Medicine

## 2023-08-24 MED ORDER — LISINOPRIL 5 MG PO TABS: 5.0000 mg | ORAL_TABLET | Freq: Two times a day (BID) | ORAL | 1 refills | Status: DC

## 2023-08-24 NOTE — Telephone Encounter (Signed)
**  Pt called stating she is currently out of this medication.**  Prescription Request  08/24/2023  Is this a "Controlled Substance" medicine? No  LOV: Visit date not found  What is the name of the medication or equipment?   lisinopril (ZESTRIL) 5 MG tablet [[id number]]  Have you contacted your pharmacy to request a refill? No   Which pharmacy would you like this sent to?  [id number] [place], [place] - [address] [place] [address] Phone: [phone] Fax: [phone]    Patient notified that their request is being sent to the clinical staff for review and that they should receive a response within 2 business days.   Please advise at [facility] [phone]

## 2023-08-24 NOTE — Telephone Encounter (Signed)
Rx sent 

## 2023-08-24 NOTE — Addendum Note (Signed)
Addended by: Maximino Sarin on: 08/24/2023 10:26 AM   Modules accepted: Orders

## 2023-08-27 ENCOUNTER — Telehealth: Payer: Self-pay | Admitting: Family Medicine

## 2023-08-27 NOTE — Telephone Encounter (Signed)
Copied from CRM [id number]. Topic: Medicare AWV >> Aug 27, 2023  2:08 PM Payton Doughty wrote: Reason for CRM: Called LVM 08/27/2023 to schedule Annual Wellness Visit TELEHEALTH   Verlee Rossetti; Care Guide Ambulatory Clinical Support [place] [facility] Direct Dial: [phone]

## 2023-08-31 ENCOUNTER — Encounter: Payer: Medicare HMO | Admitting: Family Medicine

## 2023-08-31 DIAGNOSIS — Z1231 Encounter for screening mammogram for malignant neoplasm of breast: Secondary | ICD-10-CM | POA: Diagnosis not present

## 2023-08-31 LAB — HM MAMMOGRAPHY

## 2023-09-02 ENCOUNTER — Encounter: Payer: Self-pay | Admitting: Family Medicine

## 2023-09-15 ENCOUNTER — Ambulatory Visit (INDEPENDENT_AMBULATORY_CARE_PROVIDER_SITE_OTHER): Payer: Medicare HMO

## 2023-09-15 VITALS — Ht 64.0 in | Wt 136.0 lb

## 2023-09-15 DIAGNOSIS — Z0000 Encounter for general adult medical examination without abnormal findings: Secondary | ICD-10-CM | POA: Diagnosis not present

## 2023-09-15 DIAGNOSIS — Z122 Encounter for screening for malignant neoplasm of respiratory organs: Secondary | ICD-10-CM

## 2023-09-15 NOTE — Patient Instructions (Addendum)
[name] , Thank you for taking time to come for your Medicare Wellness Visit. I appreciate your ongoing commitment to your health goals. Please review the following plan we discussed and let me know if I can assist you in the future.   Referrals/Orders/Follow-Ups/Clinician Recommendations:   This is a list of the screening recommended for you and due dates:  Health Maintenance  Topic Date Due   Pneumonia Vaccine (1 of 2 - PCV) Never done   Screening for Lung Cancer  Never done   Zoster (Shingles) Vaccine (1 of 2) Never done   Flu Shot  05/21/2023   COVID-19 Vaccine (4 - 2023-24 season) 06/21/2023   Medicare Annual Wellness Visit  09/14/2024   Mammogram  08/30/2025   DTaP/Tdap/Td vaccine (2 - Td or Tdap) 07/17/2030   DEXA scan (bone density measurement)  Completed   HPV Vaccine  Aged Out   Colon Cancer Screening  Discontinued   Hepatitis C Screening  Discontinued    Advanced directives: (In Chart) A copy of your advanced directives are scanned into your chart should your provider ever need it.  Next Medicare Annual Wellness Visit scheduled for next year: Yes

## 2023-09-15 NOTE — Progress Notes (Signed)
Subjective:   Cynthia Hubbard is a 72 y.o. female who presents for Medicare Annual (Subsequent) preventive examination.  Visit Complete: Virtual I connected with  Cynthia Hubbard on 09/15/23 by a audio enabled telemedicine application and verified that I am speaking with the correct person using two identifiers.  Patient Location: Home  Provider Location: Home Office  I discussed the limitations of evaluation and management by telemedicine. The patient expressed understanding and agreed to proceed.  Vital Signs: Because this visit was a virtual/telehealth visit, some criteria may be missing or patient reported. Any vitals not documented were not able to be obtained and vitals that have been documented are patient reported.  Patient Medicare AWV questionnaire was completed by the patient on 09/14/23; I have confirmed that all information answered by patient is correct and no changes since this date.  Cardiac Risk Factors include: advanced age (>30men, >43 women);hypertension;smoking/ tobacco exposure     Objective:    Today's Vitals   09/15/23 1003  Weight: 136 lb (61.7 kg)  Height: 5\' 4"  (1.626 m)   Body mass index is 23.34 kg/m.     09/15/2023   10:09 AM 09/03/2022    2:32 PM 08/27/2021   10:03 AM 06/09/2019    9:59 AM 04/26/2018    1:19 PM 03/10/2017    8:39 AM 06/21/2015    9:58 AM  Advanced Directives  Does Patient Have a Medical Advance Directive? Yes Yes Yes Yes No No No  Type of Estate agent of [place];Living will Healthcare Power of [place];Living will Healthcare Power of [place];Living will Healthcare Power of [place];Living will     Does patient want to make changes to medical advance directive? No - Patient declined No - Patient declined  No - Patient declined     Copy of Healthcare Power of Attorney in Chart? Yes - validated most recent copy scanned in chart (See row information) Yes - validated most recent copy scanned in chart (See row  information) Yes - validated most recent copy scanned in chart (See row information) No - copy requested     Would patient like information on creating a medical advance directive?     Yes (MAU/Ambulatory/Procedural Areas - Information given) No - Patient declined No - patient declined information    Current Medications (verified) Outpatient Encounter Medications as of 09/15/2023  Medication Sig   Cholecalciferol (VITAMIN D3) 5000 UNITS CAPS Take 1 capsule by mouth daily.   FOLIC ACID PO Take by mouth.   lisinopril (ZESTRIL) 5 MG tablet Take 1 tablet (5 mg total) by mouth 2 (two) times daily.   OVER THE COUNTER MEDICATION Mega red daily   Probiotic Product (PROBIOTIC DAILY PO) Take by mouth daily.   simvastatin (ZOCOR) 10 MG tablet TAKE 1 TABLET BY MOUTH EVERY DAY   No facility-administered encounter medications on file as of 09/15/2023.    Allergies (verified) Patient has no known allergies.   History: Past Medical History:  Diagnosis Date   Chicken pox 72 yrs old   H/O measles    3 day measles    History of chicken pox    HTN (hypertension) 03/16/2014   Hyperlipidemia    weighed 30 pounds heavier- used to take crestor   Hypertension    was 30 pounds heavier   Measles 6 th grade   3 day measles   Osteopenia 09/14/2015   Other and unspecified hyperlipidemia 03/16/2014   Retinal hemorrhage of right eye 03/16/2014   Tobacco abuse disorder 03/16/2014  Past Surgical History:  Procedure Laterality Date   REFRACTIVE SURGERY Right    Family History  Adopted: Yes   Social History   Socioeconomic History   Marital status: Married    Spouse name: Not on file   Number of children: Not on file   Years of education: Not on file   Highest education level: Not on file  Occupational History   Not on file  Tobacco Use   Smoking status: Every Day    Current packs/day: 0.75    Average packs/day: 0.8 packs/day for 40.0 years (30.0 ttl pk-yrs)    Types: Cigarettes   Smokeless  tobacco: Never   Tobacco comments:    pt declines cessation material  Substance and Sexual Activity   Alcohol use: Yes    Comment: very seldom   Drug use: No   Sexual activity: Yes    Comment: lives with husband, no dietary restrictions, works part time at [employer]  Other Topics Concern   Not on file  Social History Narrative   Not on file   Social Determinants of Health   Financial Resource Strain: Low Risk  (09/14/2023)   Overall Financial Resource Strain (CARDIA)    Difficulty of Paying Living Expenses: Not hard at all  Food Insecurity: No Food Insecurity (09/14/2023)   Hunger Vital Sign    Worried About Running Out of Food in the Last Year: Never true    Ran Out of Food in the Last Year: Never true  Transportation Needs: No Transportation Needs (09/14/2023)   PRAPARE - Administrator, Civil Service (Medical): No    Lack of Transportation (Non-Medical): No  Physical Activity: Patient Declined (09/14/2023)   Exercise Vital Sign    Days of Exercise per Week: Patient declined    Minutes of Exercise per Session: Patient declined  Stress: No Stress Concern Present (09/14/2023)   [employer] of Occupational Health - Occupational Stress Questionnaire    Feeling of Stress : Not at all  Social Connections: Unknown (09/14/2023)   Social Connection and Isolation Panel [NHANES]    Frequency of Communication with Friends and Family: More than three times a week    Frequency of Social Gatherings with Friends and Family: Patient declined    Attends Religious Services: Not on Marketing executive or Organizations: No    Attends Engineer, structural: Patient declined    Marital Status: Married    Tobacco Counseling Ready to quit: Yes Counseling given: Yes Tobacco comments: pt declines cessation material   Clinical Intake:  Pre-visit preparation completed: Yes  Pain : No/denies pain     BMI - recorded: 23.34 Nutritional Status:  BMI of 19-24  Normal Nutritional Risks: None Diabetes: No  How often do you need to have someone help you when you read instructions, pamphlets, or other written materials from your doctor or pharmacy?: 1 - Never  Interpreter Needed?: No  Information entered by :: Theresa Mulligan LPN   Activities of Daily Living    09/14/2023    7:17 PM  In your present state of health, do you have any difficulty performing the following activities:  Hearing? 0  Vision? 0  Difficulty concentrating or making decisions? 0  Walking or climbing stairs? 0  Dressing or bathing? 0  Doing errands, shopping? 0  Preparing Food and eating ? N  Using the Toilet? N  In the past six months, have you accidently leaked urine? N  Do you have problems with loss of bowel control? N  Managing your Medications? N  Managing your Finances? N  Housekeeping or managing your Housekeeping? N    Patient Care Team: Bradd Canary, MD as PCP - General (Family Medicine) Mammography, [facility] (Diagnostic Radiology)  Indicate any recent Medical Services you may have received from other than [facility] providers in the past year (date may be approximate).     Assessment:   This is a routine wellness examination for [place].  Hearing/Vision screen Hearing Screening - Comments:: Denies hearing difficulties   Vision Screening - Comments:: Wears rx glasses - up to date with routine eye exams with  [facility]   Goals Addressed               This Visit's Progress     DIET - INCREASE WATER INTAKE (pt-stated)        Cut back on Pepsi!       Depression Screen    09/15/2023   10:15 AM 09/03/2022    2:35 PM 08/28/2022    1:17 PM 08/27/2021   12:01 PM 08/27/2021   10:06 AM 07/17/2020    9:54 AM 06/09/2019    9:59 AM  PHQ 2/9 Scores  PHQ - 2 Score 0 0 0 0 1 0 0  PHQ- 9 Score   0 1  1     Fall Risk    09/15/2023   10:09 AM 09/14/2023    7:17 PM 09/03/2022    2:32 PM 08/28/2022    1:17 PM 08/27/2021   10:04 AM   Fall Risk   Falls in the past year? 0 0 0 0 0  Number falls in past yr: 0  0 0 0  Injury with Fall? 0  0 0 0  Risk for fall due to : No Fall Risks  No Fall Risks    Follow up Falls prevention discussed  Falls evaluation completed Falls evaluation completed Falls prevention discussed    MEDICARE RISK AT HOME: Medicare Risk at Home Any stairs in or around the home?: Yes If so, are there any without handrails?: No Home free of loose throw rugs in walkways, pet beds, electrical cords, etc?: Yes Adequate lighting in your home to reduce risk of falls?: Yes Life alert?: No Use of a cane, walker or w/c?: No Grab bars in the bathroom?: Yes Shower chair or bench in shower?: No Elevated toilet seat or a handicapped toilet?: No  TIMED UP AND GO:  Was the test performed?  No    Cognitive Function:    04/26/2018    1:20 PM 06/21/2015   10:27 AM  MMSE - Mini Mental State Exam  Orientation to time 5 5  Orientation to Place 5 5  Registration 3 3  Attention/ Calculation 5 5  Recall 2 3  Language- name 2 objects 2 2  Language- repeat 1 1  Language- follow 3 step command 3 3  Language- read & follow direction 1 1  Write a sentence 1 1  Copy design 1 1  Total score 29 30        09/15/2023   10:10 AM 09/03/2022    2:48 PM  6CIT Screen  What Year? 0 points 0 points  What month? 0 points 0 points  What time? 0 points 0 points  Count back from 20 0 points 0 points  Months in reverse 0 points 0 points  Repeat phrase 0 points 0 points  Total Score  0 points 0 points    Immunizations Immunization History  Administered Date(s) Administered   Fluad Quad(high Dose 65+) 08/09/2019, 08/24/2020, 08/27/2021, 08/13/2022   PFIZER(Purple Top)SARS-COV-2 Vaccination 11/26/2019, 12/17/2019, 08/18/2020   Tdap 07/17/2020    TDAP status: Up to date  Flu Vaccine status: Due, Education has been provided regarding the importance of this vaccine. Advised may receive this vaccine at local pharmacy or  Health Dept. Aware to provide a copy of the vaccination record if obtained from local pharmacy or Health Dept. Verbalized acceptance and understanding.  Pneumococcal vaccine status: Due, Education has been provided regarding the importance of this vaccine. Advised may receive this vaccine at local pharmacy or Health Dept. Aware to provide a copy of the vaccination record if obtained from local pharmacy or Health Dept. Verbalized acceptance and understanding.  Covid-19 vaccine status: Declined, Education has been provided regarding the importance of this vaccine but patient still declined. Advised may receive this vaccine at local pharmacy or Health Dept.or vaccine clinic. Aware to provide a copy of the vaccination record if obtained from local pharmacy or Health Dept. Verbalized acceptance and understanding.  Qualifies for Shingles Vaccine? Yes   Zostavax completed No   Shingrix Completed?: No.    Education has been provided regarding the importance of this vaccine. Patient has been advised to call insurance company to determine out of pocket expense if they have not yet received this vaccine. Advised may also receive vaccine at local pharmacy or Health Dept. Verbalized acceptance and understanding.  Screening Tests Health Maintenance  Topic Date Due   Pneumonia Vaccine 17+ Years old (1 of 2 - PCV) Never done   Lung Cancer Screening  Never done   Zoster Vaccines- Shingrix (1 of 2) Never done   INFLUENZA VACCINE  05/21/2023   COVID-19 Vaccine (4 - 2023-24 season) 06/21/2023   Medicare Annual Wellness (AWV)  09/14/2024   MAMMOGRAM  08/30/2025   DTaP/Tdap/Td (2 - Td or Tdap) 07/17/2030   DEXA SCAN  Completed   HPV VACCINES  Aged Out   Colonoscopy  Discontinued   Hepatitis C Screening  Discontinued    Health Maintenance    Mammogram status: Completed 08/31/23. Repeat every year  Bone Density status: Completed 08/25/22. Results reflect: Bone density results: OSTEOPOROSIS. Repeat every    years.  Lung Cancer Screening: (Low Dose CT Chest recommended if Age 41-80 years, 20 pack-year currently smoking OR have quit w/in 15years.) does qualify.   Lung Cancer Screening Referral: 09/15/23  Additional Screening:    Vision Screening: Recommended annual ophthalmology exams for early detection of glaucoma and other disorders of the eye. Is the patient up to date with their annual eye exam?  Yes  Who is the provider or what is the name of the office in which the patient attends annual eye exams? [facility] If pt is not established with a provider, would they like to be referred to a provider to establish care? No .   Dental Screening: Recommended annual dental exams for proper oral hygiene   Community Resource Referral / Chronic Care Management:  CRR required this visit?  No   CCM required this visit?  No     Plan:     I have personally reviewed and noted the following in the patient's chart:   Medical and social history Use of alcohol, tobacco or illicit drugs  Current medications and supplements including opioid prescriptions. Patient is not currently taking opioid prescriptions. Functional ability and status Nutritional status Physical activity  Advanced directives List of other physicians Hospitalizations, surgeries, and ER visits in previous 12 months Vitals Screenings to include cognitive, depression, and falls Referrals and appointments  In addition, I have reviewed and discussed with patient certain preventive protocols, quality metrics, and best practice recommendations. A written personalized care plan for preventive services as well as general preventive health recommendations were provided to patient.     Tillie Rung, LPN   [id number]   After Visit Summary: (MyChart) Due to this being a telephonic visit, the after visit summary with patients personalized plan was offered to patient via MyChart   Nurse Notes: None

## 2023-10-28 NOTE — Assessment & Plan Note (Addendum)
 Patient encouraged to maintain heart healthy diet, regular exercise, adequate sleep. Consider daily probiotics. Take medications as prescribed. Labs ordered and reviewed  Colon cancer screening, patient declines colonoscoy, cologuard and ifob MGM 08/2022 normal wants to do MGM every other year Dexa 08/2022 osteoporosis RSV (respiratory syncitial virus) vaccine at pharmacy, Arexvy Covid booster at pharmacy Prevnar 20 Shingrix is the new shingles shot, 2 shots over 2-6 months, confirm coverage with insurance and document, then can return here for shots with nurse appt or at pharmacy   Shots are recommended bu tpatient declines

## 2023-10-28 NOTE — Assessment & Plan Note (Signed)
 Well controlled, no changes to meds. Encouraged heart healthy diet such as the DASH diet and exercise as tolerated.

## 2023-10-28 NOTE — Assessment & Plan Note (Signed)
 Encourage heart healthy diet such as MIND or DASH diet, increase exercise, avoid trans fats, simple carbohydrates and processed foods, consider a krill or fish or flaxseed oil cap daily.

## 2023-10-28 NOTE — Assessment & Plan Note (Signed)
 [id number] acceptable, minimize simple carbs. Increase exercise as tolerated.

## 2023-10-28 NOTE — Assessment & Plan Note (Signed)
Encouraged to get adequate exercise, calcium and vitamin d intake patient declines meds at this time.

## 2023-10-29 ENCOUNTER — Encounter: Payer: Self-pay | Admitting: Family Medicine

## 2023-10-29 ENCOUNTER — Ambulatory Visit (INDEPENDENT_AMBULATORY_CARE_PROVIDER_SITE_OTHER): Payer: Medicare HMO | Admitting: Family Medicine

## 2023-10-29 VITALS — BP 122/76 | HR 65 | Temp 98.5°F | Resp 16 | Ht 63.0 in | Wt 138.6 lb

## 2023-10-29 DIAGNOSIS — I10 Essential (primary) hypertension: Secondary | ICD-10-CM

## 2023-10-29 DIAGNOSIS — E782 Mixed hyperlipidemia: Secondary | ICD-10-CM | POA: Diagnosis not present

## 2023-10-29 DIAGNOSIS — M810 Age-related osteoporosis without current pathological fracture: Secondary | ICD-10-CM

## 2023-10-29 DIAGNOSIS — Z0000 Encounter for general adult medical examination without abnormal findings: Secondary | ICD-10-CM

## 2023-10-29 DIAGNOSIS — R739 Hyperglycemia, unspecified: Secondary | ICD-10-CM | POA: Diagnosis not present

## 2023-10-29 DIAGNOSIS — Z1231 Encounter for screening mammogram for malignant neoplasm of breast: Secondary | ICD-10-CM

## 2023-10-29 DIAGNOSIS — F17200 Nicotine dependence, unspecified, uncomplicated: Secondary | ICD-10-CM

## 2023-10-29 NOTE — Patient Instructions (Addendum)
 Bone density shows osteoporosis. Recommend calcium intake of 1200 to 1500 mg daily, divided into roughly 3 doses. Best source is the diet and a single dairy serving is about 500 mg, a supplement of calcium citrate once or twice daily to balance diet is fine if not getting enough in diet. Also need Vitamin D  2000 IU caps, 1 cap daily if not already taking vitamin D . Also recommend weight baring exercise on hips and upper body to keep bones strong.   RSV (respiratory syncitial virus) vaccine at pharmacy, Arexvy Covid booster at pharmacy Prevnar 20 Shingrix is the new shingles shot, 2 shots over 2-6 months, confirm coverage with insurance and document, then can return here for shots with nurse appt or at pharmacy    Tobacco Use Disorder Tobacco use disorder (TUD) occurs when a person craves, seeks, and uses tobacco, regardless of the consequences. This disorder can cause problems with mental and physical health. It can affect your ability to have healthy relationships. It can also keep you from meeting your responsibilities at work, home, or school. Tobacco products contain a dangerous chemical called nicotine. Nicotine triggers hormones that make the body feel stimulated and works on areas of the brain that make a person feel good. These effects can make the person depend on nicotine, which makes it hard to quit tobacco. Tobacco may be: Smoked as a cigarette or cigar. Inhaled using vaping devices, such as e-cigarettes. Smoked in a pipe or hookah. Chewed as smokeless tobacco. Inhaled into the nostrils as snuff. What are the causes? This condition is caused by using nicotine. Nicotine causes changes in your brain that make you want more and more. This is called addiction. This can make it hard to stop using tobacco once you start. What are the signs or symptoms? Symptoms of TUD may include: Being unable to stop your tobacco use even after planned quit attempts. Spending an abnormal amount of time  getting or using tobacco. Craving tobacco. Tobacco use that: Interferes with your work, school, or home life. Interferes with your personal and social relationships. Makes you give up activities that you once enjoyed or found important. Using tobacco even though you know that it is: Dangerous or bad for your health or someone else's health. Causing problems in your life. Needing more and more of the substance to get the same effect (developing tolerance). Using the substance to avoid unpleasant symptoms if you do not use the substance (withdrawal). How is this diagnosed? This condition may be diagnosed based on: Your current and past tobacco use. Your health care provider may ask questions about how your tobacco use affects your life. A physical exam. You may be diagnosed with TUD if you have at least two symptoms within a 36-month period. How is this treated? This condition is treated by stopping tobacco use. Many people are unable to quit on their own and need help. Treatment may include: Nicotine replacement therapy (NRT). NRT provides nicotine without the other harmful chemicals in tobacco. NRT gradually lowers the dosage of nicotine in the body and reduces withdrawal symptoms. NRT is available as: Over-the-counter gums, lozenges, and skin patches. Prescription mouth inhalers and nasal sprays. Medicine that acts on the brain to reduce cravings and withdrawal symptoms. A type of talk therapy that examines your triggers for tobacco use, how to avoid them, and how to cope with cravings (behavioral therapy). Hypnosis. This may help with withdrawal symptoms. Joining a support group for people coping with TUD. The best treatment for TUD  is usually a combination of medicine, talk therapy, and support groups. Recovery can be a long process. Many people start using tobacco again after stopping (relapse). If you relapse, it does not mean that treatment will not work. Follow these instructions at  home:  Lifestyle Do not use any products that contain nicotine or tobacco. These products include cigarettes, chewing tobacco, and vaping devices, such as e-cigarettes. If you need help quitting, ask your health care provider. Avoid things that trigger tobacco use as much as you can. Triggers include people and situations that usually cause you to use tobacco. Avoid drinks that contain caffeine, including coffee. These may worsen some withdrawal symptoms. Find ways to manage stress. Wanting to smoke may cause stress, and stress can make you want to smoke. Relaxation techniques such as deep breathing, meditation, and yoga may help. Attend support groups as needed. These groups are an important part of long-term recovery for many people. General instructions Take over-the-counter and prescription medicines only as told by your health care provider. Check with your health care provider before taking any new prescription or over-the-counter medicines. Decide on a friend, family member, or smoking quit-line (such as 1-800-QUIT-NOW in the [place] that you can call or text when you feel the urge to smoke or when you need help coping with cravings. Keep all follow-up visits. This is important. Contact a health care provider if: You are not able to take your medicines as told by your health care provider. Your symptoms get worse, even with treatment. Summary Tobacco use disorder (TUD) occurs when a person craves, seeks, and uses tobacco regardless of the consequences. This condition may be diagnosed based on your current and past tobacco use and a physical exam. Many people are unable to quit on their own and need help. Recovery can be a long process. The most effective treatment for TUD is usually a combination of medicine, talk therapy, and support groups. This information is not intended to replace advice given to you by your health care provider. Make sure you discuss any questions you have with your  health care provider. Document Revised: 10/01/2021 Document Reviewed: 10/01/2021 Elsevier Patient Education  2024 [employer]. Preventive Care 65 Years and Older, Female Preventive care refers to lifestyle choices and visits with your health care provider that can promote health and wellness. Preventive care visits are also called wellness exams. What can I expect for my preventive care visit? Counseling Your health care provider may ask you questions about your: Medical history, including: Past medical problems. Family medical history. Pregnancy and menstrual history. History of falls. Current health, including: Memory and ability to understand (cognition). Emotional well-being. Home life and relationship well-being. Sexual activity and sexual health. Lifestyle, including: Alcohol, nicotine or tobacco, and drug use. Access to firearms. Diet, exercise, and sleep habits. Work and work astronomer. Sunscreen use. Safety issues such as seatbelt and bike helmet use. Physical exam Your health care provider will check your: Height and weight. These may be used to calculate your BMI (body mass index). BMI is a measurement that tells if you are at a healthy weight. Waist circumference. This measures the distance around your waistline. This measurement also tells if you are at a healthy weight and may help predict your risk of certain diseases, such as type 2 diabetes and high blood pressure. Heart rate and blood pressure. Body temperature. Skin for abnormal spots. What immunizations do I need?  Vaccines are usually given at various ages, according to a schedule. Your  health care provider will recommend vaccines for you based on your age, medical history, and lifestyle or other factors, such as travel or where you work. What tests do I need? Screening Your health care provider may recommend screening tests for certain conditions. This may include: Lipid and cholesterol  levels. Hepatitis C test. Hepatitis B test. HIV (human immunodeficiency virus) test. STI (sexually transmitted infection) testing, if you are at risk. Lung cancer screening. Colorectal cancer screening. Diabetes screening. This is done by checking your blood sugar (glucose) after you have not eaten for a while (fasting). Mammogram. Talk with your health care provider about how often you should have regular mammograms. BRCA-related cancer screening. This may be done if you have a family history of breast, ovarian, tubal, or peritoneal cancers. Bone density scan. This is done to screen for osteoporosis. Talk with your health care provider about your test results, treatment options, and if necessary, the need for more tests. Follow these instructions at home: Eating and drinking  Eat a diet that includes fresh fruits and vegetables, whole grains, lean protein, and low-fat dairy products. Limit your intake of foods with high amounts of sugar, saturated fats, and salt. Take vitamin and mineral supplements as recommended by your health care provider. Do not drink alcohol if your health care provider tells you not to drink. If you drink alcohol: Limit how much you have to 0-1 drink a day. Know how much alcohol is in your drink. In the [place] one drink equals one 12 oz bottle of beer (355 mL), one 5 oz glass of wine (148 mL), or one 1 oz glass of hard liquor (44 mL). Lifestyle Brush your teeth every morning and night with fluoride  toothpaste. Floss one time each day. Exercise for at least 30 minutes 5 or more days each week. Do not use any products that contain nicotine or tobacco. These products include cigarettes, chewing tobacco, and vaping devices, such as e-cigarettes. If you need help quitting, ask your health care provider. Do not use drugs. If you are sexually active, practice safe sex. Use a condom or other form of protection in order to prevent STIs. Take aspirin only as told by your  health care provider. Make sure that you understand how much to take and what form to take. Work with your health care provider to find out whether it is safe and beneficial for you to take aspirin daily. Ask your health care provider if you need to take a cholesterol-lowering medicine (statin). Find healthy ways to manage stress, such as: Meditation, yoga, or listening to music. Journaling. Talking to a trusted person. Spending time with friends and family. Minimize exposure to UV radiation to reduce your risk of skin cancer. Safety Always wear your seat belt while driving or riding in a vehicle. Do not drive: If you have been drinking alcohol. Do not ride with someone who has been drinking. When you are tired or distracted. While texting. If you have been using any mind-altering substances or drugs. Wear a helmet and other protective equipment during sports activities. If you have firearms in your house, make sure you follow all gun safety procedures. What's next? Visit your health care provider once a year for an annual wellness visit. Ask your health care provider how often you should have your eyes and teeth checked. Stay up to date on all vaccines. This information is not intended to replace advice given to you by your health care provider. Make sure you discuss any questions you  have with your health care provider. Document Revised: 04/03/2021 Document Reviewed: 04/03/2021 Elsevier Patient Education  2024 [employer].

## 2023-10-29 NOTE — Assessment & Plan Note (Signed)
 1/2 PPD on average since roughly age 73. Encouraged complete cessation. Discussed need to quit as relates to risk of numerous cancers, cardiac and pulmonary disease as well as neurologic complications. Counseled for greater than 3 minutes. Encouraged low dose CT lung cancer screening

## 2023-10-29 NOTE — Progress Notes (Signed)
 Subjective:    Patient ID: Cynthia Hubbard, female    DOB: 31-Aug-1951, 73 y.o.   MRN: [id number]  Chief Complaint  Patient presents with   Annual Exam    HPI Discussed the use of AI scribe software for clinical note transcription with the patient, who gave verbal consent to proceed.  History of Present Illness   The patient, a 73 year old with a history of osteoporosis and smoking, presents with concerns about her bone health and smoking habits. She started smoking in her thirties and currently smokes about half a pack a day, although she is trying to reduce her intake. She has had some success in reducing her smoking, with some days smoking as few as eight cigarettes. She acknowledges the health risks associated with smoking, particularly in light of her husband's recent treatment for squamous cell carcinoma of the tongue, which she attributes to his own smoking habit.  The patient also expresses concerns about her bone health, given her diagnosis of osteoporosis. She takes a calcium supplement and tries to include dairy in her diet, but admits that her diet has been inconsistent due to the stress of her husband's illness. She is reluctant to take medication for her osteoporosis, preferring to manage her condition through diet and lifestyle changes.  The patient also mentions a previous urinary tract infection, which was treated successfully. She is due for a mammogram and a DEXA scan, and the doctor advises her on the importance of regular screenings. The patient is also reluctant to undergo cataract surgery, despite being told that she needs it.        Past Medical History:  Diagnosis Date   Chicken pox 73 yrs old   H/O measles    3 day measles    History of chicken pox    HTN (hypertension) 03/16/2014   Hyperlipidemia    weighed 30 pounds heavier- used to take crestor   Hypertension    was 30 pounds heavier   Measles 6 th grade   3 day measles   Osteopenia 09/14/2015   Other and  unspecified hyperlipidemia 03/16/2014   Retinal hemorrhage of right eye 03/16/2014   Tobacco abuse disorder 03/16/2014    Past Surgical History:  Procedure Laterality Date   REFRACTIVE SURGERY Right     Family History  Adopted: Yes    Social History   Socioeconomic History   Marital status: Married    Spouse name: Not on file   Number of children: Not on file   Years of education: Not on file   Highest education level: Not on file  Occupational History   Not on file  Tobacco Use   Smoking status: Every Day    Current packs/day: 0.75    Average packs/day: 0.8 packs/day for 40.0 years (30.0 ttl pk-yrs)    Types: Cigarettes   Smokeless tobacco: Never   Tobacco comments:    pt declines cessation material  Substance and Sexual Activity   Alcohol use: Yes    Comment: very seldom   Drug use: No   Sexual activity: Yes    Comment: lives with husband, no dietary restrictions, works part time at [employer]  Other Topics Concern   Not on file  Social History Narrative   Not on file   Social Drivers of Health   Financial Resource Strain: Low Risk  (09/14/2023)   Overall Financial Resource Strain (CARDIA)    Difficulty of Paying Living Expenses: Not hard at all  Food Insecurity: No  Food Insecurity (09/14/2023)   Hunger Vital Sign    Worried About Running Out of Food in the Last Year: Never true    Ran Out of Food in the Last Year: Never true  Transportation Needs: No Transportation Needs (09/14/2023)   PRAPARE - Administrator, Civil Service (Medical): No    Lack of Transportation (Non-Medical): No  Physical Activity: Patient Declined (09/14/2023)   Exercise Vital Sign    Days of Exercise per Week: Patient declined    Minutes of Exercise per Session: Patient declined  Stress: No Stress Concern Present (09/14/2023)   [employer] of Occupational Health - Occupational Stress Questionnaire    Feeling of Stress : Not at all  Social Connections: Unknown  (09/14/2023)   Social Connection and Isolation Panel [NHANES]    Frequency of Communication with Friends and Family: More than three times a week    Frequency of Social Gatherings with Friends and Family: Patient declined    Attends Religious Services: Not on Insurance Claims Handler of Clubs or Organizations: No    Attends Banker Meetings: Patient declined    Marital Status: Married  Catering Manager Violence: Not At Risk (08/27/2021)   Humiliation, Afraid, Rape, and Kick questionnaire    Fear of Current or Ex-Partner: No    Emotionally Abused: No    Physically Abused: No    Sexually Abused: No    Outpatient Medications Prior to Visit  Medication Sig Dispense Refill   Cholecalciferol (VITAMIN D3) 5000 UNITS CAPS Take 1 capsule by mouth daily.     FOLIC ACID PO Take by mouth.     lisinopril  (ZESTRIL ) 5 MG tablet Take 1 tablet (5 mg total) by mouth 2 (two) times daily. 180 tablet 1   OVER THE COUNTER MEDICATION Mega red daily     Probiotic Product (PROBIOTIC DAILY PO) Take by mouth daily.     simvastatin  (ZOCOR ) 10 MG tablet TAKE 1 TABLET BY MOUTH EVERY DAY 90 tablet 1   No facility-administered medications prior to visit.    No Known Allergies  Review of Systems  Constitutional:  Negative for chills, fever and malaise/fatigue.  HENT:  Positive for congestion. Negative for hearing loss.   Eyes:  Negative for discharge.  Respiratory:  Positive for cough. Negative for sputum production and shortness of breath.   Cardiovascular:  Negative for chest pain, palpitations and leg swelling.  Gastrointestinal:  Negative for abdominal pain, blood in stool, constipation, diarrhea, heartburn, nausea and vomiting.  Genitourinary:  Negative for dysuria, frequency, hematuria and urgency.  Musculoskeletal:  Negative for back pain, falls and myalgias.  Skin:  Negative for rash.  Neurological:  Negative for dizziness, sensory change, loss of consciousness, weakness and headaches.   Endo/Heme/Allergies:  Negative for environmental allergies. Does not bruise/bleed easily.  Psychiatric/Behavioral:  Negative for depression and suicidal ideas. The patient is not nervous/anxious and does not have insomnia.        Objective:    Physical Exam Constitutional:      General: She is not in acute distress.    Appearance: Normal appearance. She is not diaphoretic.  HENT:     Head: Normocephalic and atraumatic.     Right Ear: Tympanic membrane, ear canal and external ear normal.     Left Ear: Tympanic membrane, ear canal and external ear normal.     Nose: Nose normal.     Mouth/Throat:     Mouth: Mucous membranes are moist.  Pharynx: Oropharynx is clear. No oropharyngeal exudate.  Eyes:     General: No scleral icterus.       Right eye: No discharge.        Left eye: No discharge.     Conjunctiva/sclera: Conjunctivae normal.     Pupils: Pupils are equal, round, and reactive to light.  Neck:     Thyroid : No thyromegaly.  Cardiovascular:     Rate and Rhythm: Normal rate and regular rhythm.     Heart sounds: Normal heart sounds. No murmur heard. Pulmonary:     Effort: Pulmonary effort is normal. No respiratory distress.     Breath sounds: Normal breath sounds. No wheezing or rales.  Abdominal:     General: Bowel sounds are normal. There is no distension.     Palpations: Abdomen is soft. There is no mass.     Tenderness: There is no abdominal tenderness.  Musculoskeletal:        General: No tenderness. Normal range of motion.     Cervical back: Normal range of motion and neck supple.  Lymphadenopathy:     Cervical: No cervical adenopathy.  Skin:    General: Skin is warm and dry.     Findings: No rash.  Neurological:     General: No focal deficit present.     Mental Status: She is alert and oriented to person, place, and time.     Cranial Nerves: No cranial nerve deficit.     Coordination: Coordination normal.     Deep Tendon Reflexes: Reflexes are normal and  symmetric. Reflexes normal.  Psychiatric:        Mood and Affect: Mood normal.        Behavior: Behavior normal.        Thought Content: Thought content normal.        Judgment: Judgment normal.     BP 122/76 (BP Location: Left Arm, Patient Position: Sitting, Cuff Size: Normal)   Pulse 65   Temp 98.5 F (36.9 C) (Oral)   Resp 16   Ht 5' 3 (1.6 m)   Wt 138 lb 9.6 oz (62.9 kg)   SpO2 98%   BMI 24.55 kg/m  Wt Readings from Last 3 Encounters:  10/29/23 138 lb 9.6 oz (62.9 kg)  09/15/23 136 lb (61.7 kg)  08/28/22 138 lb 3.2 oz (62.7 kg)    Diabetic Foot Exam - Simple   No data filed    Lab Results  Component Value Date   WBC 5.5 10/29/2023   HGB 14.0 10/29/2023   HCT 42.9 10/29/2023   PLT 157.0 10/29/2023   GLUCOSE 91 10/29/2023   CHOL 193 10/29/2023   TRIG 127.0 10/29/2023   HDL 43.30 10/29/2023   LDLCALC 125 (H) 10/29/2023   ALT 13 10/29/2023   AST 22 10/29/2023   NA 138 10/29/2023   K 4.6 10/29/2023   CL 102 10/29/2023   CREATININE 0.82 10/29/2023   BUN 17 10/29/2023   CO2 29 10/29/2023   TSH 0.70 10/29/2023   HGBA1C 5.9 10/29/2023    Lab Results  Component Value Date   TSH 0.70 10/29/2023   Lab Results  Component Value Date   WBC 5.5 10/29/2023   HGB 14.0 10/29/2023   HCT 42.9 10/29/2023   MCV 84.3 10/29/2023   PLT 157.0 10/29/2023   Lab Results  Component Value Date   NA 138 10/29/2023   K 4.6 10/29/2023   CO2 29 10/29/2023   GLUCOSE 91 10/29/2023   BUN  17 10/29/2023   CREATININE 0.82 10/29/2023   BILITOT 0.6 10/29/2023   ALKPHOS 78 10/29/2023   AST 22 10/29/2023   ALT 13 10/29/2023   PROT 7.5 10/29/2023   ALBUMIN 4.5 10/29/2023   CALCIUM 10.1 10/29/2023   GFR 70.48 10/29/2023   Lab Results  Component Value Date   CHOL 193 10/29/2023   Lab Results  Component Value Date   HDL 43.30 10/29/2023   Lab Results  Component Value Date   LDLCALC 125 (H) 10/29/2023   Lab Results  Component Value Date   TRIG 127.0 10/29/2023   Lab  Results  Component Value Date   CHOLHDL 4 10/29/2023   Lab Results  Component Value Date   HGBA1C 5.9 10/29/2023       Assessment & Plan:  Primary hypertension Assessment & Plan: Well controlled, no changes to meds. Encouraged heart healthy diet such as the DASH diet and exercise as tolerated.    Orders: -     CBC with Differential/Platelet; Future -     Comprehensive metabolic panel; Future -     TSH; Future  Hyperglycemia Assessment & Plan: hgba1c acceptable, minimize simple carbs. Increase exercise as tolerated.    Orders: -     Hemoglobin A1c; Future  Hyperlipidemia, mixed Assessment & Plan: Encourage heart healthy diet such as MIND or DASH diet, increase exercise, avoid trans fats, simple carbohydrates and processed foods, consider a krill or fish or flaxseed oil cap daily.    Orders: -     Lipid panel; Future  Osteoporosis, unspecified osteoporosis type, unspecified pathological fracture presence Assessment & Plan: Encouraged to get adequate exercise, calcium and vitamin d  intake patient declines meds at this time.   Orders: -     VITAMIN D  25 Hydroxy (Vit-D Deficiency, Fractures); Future  Preventative health care Assessment & Plan: Patient encouraged to maintain heart healthy diet, regular exercise, adequate sleep. Consider daily probiotics. Take medications as prescribed. Labs ordered and reviewed  Colon cancer screening, patient declines colonoscoy, cologuard and ifob MGM 08/2022 normal wants to do MGM every other year Dexa 08/2022 osteoporosis RSV (respiratory syncitial virus) vaccine at pharmacy, Arexvy Covid booster at pharmacy Prevnar 20 Shingrix is the new shingles shot, 2 shots over 2-6 months, confirm coverage with insurance and document, then can return here for shots with nurse appt or at pharmacy   Shots are recommended bu tpatient declines   Tobacco use disorder Assessment & Plan: 1/2 PPD on average since roughly age 34. Encouraged complete  cessation. Discussed need to quit as relates to risk of numerous cancers, cardiac and pulmonary disease as well as neurologic complications. Counseled for greater than 3 minutes. Encouraged low dose CT lung cancer screening   Breast cancer screening by mammogram    Assessment and Plan    Chronic Tobacco Use Long history of smoking with variable daily use. Discussed the increased risk for lung cancer and the benefits of annual low-dose CT scans for early detection. Patient is considering this. -Encouraged smoking cessation. -Consider annual low-dose CT scan for lung cancer screening patient will consider but does not agree today  Osteoporosis Discussed the importance of adequate calcium intake and weight-bearing exercise for bone health. Patient is currently taking calcium supplements and has a diet that includes cheese. -Continue calcium supplements. -Encourage weight-bearing exercise. -Consider bone-strengthening medication.  Vitamin D  Deficiency Recent labs showed slightly low vitamin D  levels. -Increase daily vitamin D  supplement to 2000 units.  Immunizations Discussed the importance of vaccinations, especially in the  context of chronic smoking. Patient received flu shot in November 2024. -Consider additional recommended vaccinations.  Follow-up Patient prefers annual visits but understands the need for more frequent visits if health issues arise. -Schedule next routine visit in 1 year. -Encourage patient to contact office if health issues arise or if she decides to pursue any discussed interventions (lung cancer screening, bone-strengthening medication).         Harlene Horton, MD

## 2023-10-30 LAB — COMPREHENSIVE METABOLIC PANEL
ALT: 13 U/L (ref 0–35)
AST: 22 U/L (ref 0–37)
Albumin: 4.5 g/dL (ref 3.5–5.2)
Alkaline Phosphatase: 78 U/L (ref 39–117)
BUN: 17 mg/dL (ref 6–23)
CO2: 29 meq/L (ref 19–32)
Calcium: 10.1 mg/dL (ref 8.4–10.5)
Chloride: 102 meq/L (ref 96–112)
Creatinine, Ser: 0.82 mg/dL (ref 0.40–1.20)
GFR: 70.48 mL/min (ref >60.00)
Glucose, Bld: 91 mg/dL (ref 70–99)
Potassium: 4.6 meq/L (ref 3.5–5.1)
Sodium: 138 meq/L (ref 135–145)
Total Bilirubin: 0.6 mg/dL (ref 0.2–1.2)
Total Protein: 7.5 g/dL (ref 6.0–8.3)

## 2023-10-30 LAB — VITAMIN D 25 HYDROXY (VIT D DEFICIENCY, FRACTURES): VITD: 62.82 ng/mL (ref 30.00–100.00)

## 2023-10-30 LAB — CBC WITH DIFFERENTIAL/PLATELET
Basophils Absolute: 0 10*3/uL (ref 0.0–0.1)
Basophils Relative: 0.6 % (ref 0.0–3.0)
Eosinophils Absolute: 0.2 10*3/uL (ref 0.0–0.7)
Eosinophils Relative: 2.9 % (ref 0.0–5.0)
HCT: 42.9 % (ref 36.0–46.0)
Hemoglobin: 14 g/dL (ref 12.0–15.0)
Lymphocytes Relative: 31 % (ref 12.0–46.0)
Lymphs Abs: 1.7 10*3/uL (ref 0.7–4.0)
MCHC: 32.7 g/dL (ref 30.0–36.0)
MCV: 84.3 fL (ref 78.0–100.0)
Monocytes Absolute: 0.3 10*3/uL (ref 0.1–1.0)
Monocytes Relative: 6 % (ref 3.0–12.0)
Neutro Abs: 3.3 10*3/uL (ref 1.4–7.7)
Neutrophils Relative %: 59.5 % (ref 43.0–77.0)
Platelets: 157 10*3/uL (ref 150.0–400.0)
RBC: 5.09 Mil/uL (ref 3.87–5.11)
RDW: 12.8 % (ref 11.5–15.5)
WBC: 5.5 10*3/uL (ref 4.0–10.5)

## 2023-10-30 LAB — LIPID PANEL
Cholesterol: 193 mg/dL (ref 0–200)
HDL: 43.3 mg/dL (ref >39.00)
LDL Cholesterol: 125 mg/dL — ABNORMAL HIGH (ref 0–99)
NonHDL: 150.14
Total CHOL/HDL Ratio: 4
Triglycerides: 127 mg/dL (ref 0.0–149.0)
VLDL: 25.4 mg/dL (ref 0.0–40.0)

## 2023-10-30 LAB — TSH: TSH: 0.7 u[IU]/mL (ref 0.35–5.50)

## 2023-10-30 LAB — HEMOGLOBIN A1C: Hgb A1c MFr Bld: 5.9 % (ref 4.6–6.5)

## 2023-10-31 ENCOUNTER — Encounter: Payer: Self-pay | Admitting: Family Medicine

## 2023-12-20 ENCOUNTER — Other Ambulatory Visit: Payer: Self-pay | Admitting: Family Medicine

## 2024-02-27 ENCOUNTER — Other Ambulatory Visit: Payer: Self-pay | Admitting: Family Medicine

## 2024-08-15 NOTE — Progress Notes (Addendum)
 Cynthia Hubbard                                          MRN: [id number]   08/15/2024   The [facility] Quality Team Specialist reviewed this patient medical record for the purposes of chart review for care gap closure. The following were reviewed: chart review for care gap closure-colorectal cancer screening.  11/01/2024- Cannot close COL for 2025    [facility] Quality Team

## 2024-09-01 DIAGNOSIS — Z1231 Encounter for screening mammogram for malignant neoplasm of breast: Secondary | ICD-10-CM | POA: Diagnosis not present

## 2024-09-01 LAB — HM MAMMOGRAPHY

## 2024-09-22 ENCOUNTER — Ambulatory Visit: Payer: Medicare HMO

## 2024-09-22 VITALS — BP 134/72 | HR 63 | Temp 97.9°F | Ht 63.0 in | Wt 136.4 lb

## 2024-09-22 DIAGNOSIS — Z0000 Encounter for general adult medical examination without abnormal findings: Secondary | ICD-10-CM | POA: Diagnosis not present

## 2024-09-22 DIAGNOSIS — Z122 Encounter for screening for malignant neoplasm of respiratory organs: Secondary | ICD-10-CM | POA: Diagnosis not present

## 2024-09-22 NOTE — Progress Notes (Signed)
 Chief Complaint  Patient presents with   Medicare Wellness     Subjective:   Cynthia Hubbard is a 73 y.o. female who presents for a Medicare Annual Wellness Visit.  Visit info / Clinical Intake: Medicare Wellness Visit Type:: Subsequent Annual Wellness Visit Persons participating in visit and providing information:: patient Medicare Wellness Visit Mode:: In-person (required for WTM) Interpreter Needed?: No Pre-visit prep was completed: yes AWV questionnaire completed by patient prior to visit?: yes Date:: 09/16/24 Living arrangements:: lives with spouse/significant other Patient's Overall Health Status Rating: good Typical amount of pain: none Does pain affect daily life?: no Are you currently prescribed opioids?: no  Dietary Habits and Nutritional Risks How many meals a day?: 2 Eats fruit and vegetables daily?: yes Most meals are obtained by: preparing own meals In the last 2 weeks, have you had any of the following?: none Diabetic:: no  Functional Status Activities of Daily Living (to include ambulation/medication): Independent Ambulation: Independent with device- listed below Home Assistive Devices/Equipment: Eyeglasses Medication Administration: Independent Home Management (perform basic housework or laundry): Independent Manage your own finances?: yes Primary transportation is: driving Concerns about vision?: no *vision screening is required for WTM* Concerns about hearing?: no  Fall Screening Falls in the past year?: 0 Number of falls in past year: 0 Was there an injury with Fall?: 0 Fall Risk Category Calculator: 0 Patient Fall Risk Level: Low Fall Risk  Fall Risk Patient at Risk for Falls Due to: No Fall Risks Fall risk Follow up: Falls evaluation completed  Home and Transportation Safety: All rugs have non-skid backing?: yes All stairs or steps have railings?: yes Grab bars in the bathtub or shower?: yes Have non-skid surface in bathtub or shower?:  yes Good home lighting?: yes Regular seat belt use?: yes Hospital stays in the last year:: no  Cognitive Assessment Difficulty concentrating, remembering, or making decisions? : no Will 6CIT or Mini Cog be Completed: yes What year is it?: 0 points What month is it?: 0 points Give patient an address phrase to remember (5 components): [address] [place] what time is it?: 0 points Count backwards from 20 to 1: 0 points Say the months of the year in reverse: 0 points Repeat the address phrase from earlier: 0 points 6 CIT Score: 0 points  Advance Directives (For Healthcare) Does Patient Have a Medical Advance Directive?: Yes Does patient want to make changes to medical advance directive?: No - Patient declined Type of Advance Directive: Healthcare Power of [place]; Living will Copy of Healthcare Power of Attorney in Chart?: Yes - validated most recent copy scanned in chart (See row information) Copy of Living Will in Chart?: Yes - validated most recent copy scanned in chart (See row information)  Reviewed/Updated  Reviewed/Updated: Reviewed All (Medical, Surgical, Family, Medications, Allergies, Care Teams, Patient Goals)    Allergies (verified) Patient has no known allergies.   Current Medications (verified) Outpatient Encounter Medications as of 09/22/2024  Medication Sig   Cholecalciferol (VITAMIN D3) 5000 UNITS CAPS Take 1 capsule by mouth daily.   FOLIC ACID PO Take by mouth.   lisinopril  (ZESTRIL ) 5 MG tablet TAKE 1 TABLET BY MOUTH TWICE A DAY   OVER THE COUNTER MEDICATION Mega red daily   Probiotic Product (PROBIOTIC DAILY PO) Take by mouth daily.   simvastatin  (ZOCOR ) 10 MG tablet Take 1 tablet (10 mg total) by mouth daily.   No facility-administered encounter medications on file as of 09/22/2024.    History: Past Medical  History:  Diagnosis Date   Chicken pox 73 yrs old   H/O measles    3 day measles    History of chicken pox    HTN (hypertension)  03/16/2014   Hyperlipidemia    weighed 30 pounds heavier- used to take crestor   Hypertension    was 30 pounds heavier   Measles 6 th grade   3 day measles   Osteopenia 09/14/2015   Other and unspecified hyperlipidemia 03/16/2014   Retinal hemorrhage of right eye 03/16/2014   Tobacco abuse disorder 03/16/2014   Past Surgical History:  Procedure Laterality Date   REFRACTIVE SURGERY Right    Family History  Adopted: Yes   Social History   Occupational History   Not on file  Tobacco Use   Smoking status: Every Day    Current packs/day: 0.75    Average packs/day: 0.8 packs/day for 40.0 years (30.0 ttl pk-yrs)    Types: Cigarettes   Smokeless tobacco: Never   Tobacco comments:    pt declines cessation material  Substance and Sexual Activity   Alcohol use: Yes    Comment: very seldom   Drug use: No   Sexual activity: Yes    Comment: lives with husband, no dietary restrictions, works part time at a materials engineer   Tobacco Counseling Ready to quit: No Counseling given: Yes Tobacco comments: pt declines cessation material  SDOH Screenings   Food Insecurity: No Food Insecurity (09/22/2024)  Housing: Unknown (09/22/2024)  Transportation Needs: No Transportation Needs (09/22/2024)  Utilities: Not At Risk (09/22/2024)  Alcohol Screen: Low Risk  (09/16/2024)  Depression (PHQ2-9): Low Risk  (09/22/2024)  Financial Resource Strain: Low Risk  (09/16/2024)  Physical Activity: Inactive (09/22/2024)  Social Connections: Moderately Integrated (09/22/2024)  Stress: No Stress Concern Present (09/22/2024)  Tobacco Use: High Risk (09/22/2024)  Health Literacy: Adequate Health Literacy (09/22/2024)   See flowsheets for full screening details  Depression Screen PHQ 2 & 9 Depression Scale- Over the past 2 weeks, how often have you been bothered by any of the following problems? Little interest or pleasure in doing things: 0 Feeling down, depressed, or hopeless (PHQ Adolescent also  includes...irritable): 0 PHQ-2 Total Score: 0     Goals Addressed               This Visit's Progress     Remain active (pt-stated)        Get more active and lose weight.             Objective:    Today's Vitals   09/22/24 1007  BP: 134/72  Pulse: 63  Temp: 97.9 F (36.6 C)  TempSrc: Oral  SpO2: 96%  Weight: 136 lb 6.4 oz (61.9 kg)  Height: 5' 3 (1.6 m)   Body mass index is 24.16 kg/m.  Hearing/Vision screen Hearing Screening - Comments:: Denies hearing difficulties   Vision Screening - Comments:: Wears rx glasses - up to date with routine eye exams with  [facility] Immunizations and Health Maintenance Health Maintenance  Topic Date Due   Pneumococcal Vaccine: 50+ Years (1 of 2 - PCV) Never done   Lung Cancer Screening  Never done   Zoster Vaccines- Shingrix (1 of 2) Never done   COVID-19 Vaccine (4 - 2025-26 season) 06/20/2024   Medicare Annual Wellness (AWV)  09/22/2025   DTaP/Tdap/Td (2 - Td or Tdap) 07/17/2030   Influenza Vaccine  Completed   Bone Density Scan  Completed   Meningococcal B Vaccine  Aged  Out   Mammogram  Discontinued   Colonoscopy  Discontinued   Hepatitis C Screening  Discontinued        Assessment/Plan:  This is a routine wellness examination for Edgar.  Patient Care Team: Domenica Harlene LABOR, MD as PCP - General (Family Medicine) Mammography, [facility] (Diagnostic Radiology)  I have personally reviewed and noted the following in the patient's chart:   Medical and social history Use of alcohol, tobacco or illicit drugs  Current medications and supplements including opioid prescriptions. Functional ability and status Nutritional status Physical activity Advanced directives List of other physicians Hospitalizations, surgeries, and ER visits in previous 12 months Vitals Screenings to include cognitive, depression, and falls Referrals and appointments  Orders Placed This Encounter  Procedures   Ambulatory Referral Lung Cancer  Screening  Pulmonary    Referral Priority:   Routine    Referral Type:   Consultation    Referral Reason:   Specialty Services Required    Number of Visits Requested:   1   In addition, I have reviewed and discussed with patient certain preventive protocols, quality metrics, and best practice recommendations. A written personalized care plan for preventive services as well as general preventive health recommendations were provided to patient.   Rojelio LELON Blush, LPN   [id number]/02/[id number]   Return in 1 year on 09/28/25  After Visit Summary: (In Person-Declined) Patient declined AVS at this time.  Nurse Notes: None

## 2024-09-22 NOTE — Patient Instructions (Addendum)
 Ms. Wands,  Thank you for taking the time for your Medicare Wellness Visit. I appreciate your continued commitment to your health goals. Please review the care plan we discussed, and feel free to reach out if I can assist you further.  Please note that Annual Wellness Visits do not include a physical exam. Some assessments may be limited, especially if the visit was conducted virtually. If needed, we may recommend an in-person follow-up with your provider.  Ongoing Care Seeing your primary care provider every 3 to 6 months helps us  monitor your health and provide consistent, personalized care.   Referrals If a referral was made during today's visit and you haven't received any updates within two weeks, please contact the referred provider directly to check on the status.  Recommended Screenings:  Health Maintenance  Topic Date Due   Pneumococcal Vaccine for age over 35 (1 of 2 - PCV) Never done   Screening for Lung Cancer  Never done   Zoster (Shingles) Vaccine (1 of 2) Never done   COVID-19 Vaccine (4 - 2025-26 season) 06/20/2024   Medicare Annual Wellness Visit  09/22/2025   DTaP/Tdap/Td vaccine (2 - Td or Tdap) 07/17/2030   Flu Shot  Completed   Osteoporosis screening with Bone Density Scan  Completed   Meningitis B Vaccine  Aged Out   Breast Cancer Screening  Discontinued   Colon Cancer Screening  Discontinued   Hepatitis C Screening  Discontinued       09/22/2024   10:19 AM  Advanced Directives  Does Patient Have a Medical Advance Directive? Yes  Type of Estate Agent of [place];Living will  Does patient want to make changes to medical advance directive? No - Patient declined  Copy of Healthcare Power of Attorney in Chart? Yes - validated most recent copy scanned in chart (See row information)    Vision: Annual vision screenings are recommended for early detection of glaucoma, cataracts, and diabetic retinopathy. These exams can also reveal signs of  chronic conditions such as diabetes and high blood pressure.  Dental: Annual dental screenings help detect early signs of oral cancer, gum disease, and other conditions linked to overall health, including heart disease and diabetes.  Please see the attached documents for additional preventive care recommendations.

## 2024-11-07 ENCOUNTER — Encounter: Payer: Medicare HMO | Admitting: Family Medicine

## 2025-05-25 ENCOUNTER — Encounter: Admitting: Family Medicine

## 2025-09-28 ENCOUNTER — Ambulatory Visit
